# Patient Record
Sex: Male | Born: 1950 | Race: White | Hispanic: No | Marital: Married | Smoking: Never smoker
Health system: Southern US, Community
[De-identification: ages and names within clinical notes are randomized; demographics above are authoritative.]

## PROBLEM LIST (undated history)

## (undated) DIAGNOSIS — J392 Other diseases of pharynx: Secondary | ICD-10-CM

## (undated) DIAGNOSIS — H539 Unspecified visual disturbance: Secondary | ICD-10-CM

## (undated) DIAGNOSIS — I10 Essential (primary) hypertension: Secondary | ICD-10-CM

## (undated) DIAGNOSIS — J189 Pneumonia, unspecified organism: Secondary | ICD-10-CM

## (undated) DIAGNOSIS — E785 Hyperlipidemia, unspecified: Secondary | ICD-10-CM

## (undated) DIAGNOSIS — L309 Dermatitis, unspecified: Secondary | ICD-10-CM

## (undated) DIAGNOSIS — M545 Low back pain, unspecified: Secondary | ICD-10-CM

## (undated) DIAGNOSIS — M1990 Unspecified osteoarthritis, unspecified site: Secondary | ICD-10-CM

## (undated) DIAGNOSIS — H9190 Unspecified hearing loss, unspecified ear: Secondary | ICD-10-CM

## (undated) DIAGNOSIS — G709 Myoneural disorder, unspecified: Secondary | ICD-10-CM

## (undated) DIAGNOSIS — R262 Difficulty in walking, not elsewhere classified: Secondary | ICD-10-CM

## (undated) DIAGNOSIS — J349 Unspecified disorder of nose and nasal sinuses: Secondary | ICD-10-CM

## (undated) DIAGNOSIS — R519 Headache, unspecified: Secondary | ICD-10-CM

## (undated) DIAGNOSIS — I639 Cerebral infarction, unspecified: Secondary | ICD-10-CM

## (undated) DIAGNOSIS — F32A Depression, unspecified: Secondary | ICD-10-CM

## (undated) DIAGNOSIS — H9390 Unspecified disorder of ear, unspecified ear: Secondary | ICD-10-CM

## (undated) DIAGNOSIS — K219 Gastro-esophageal reflux disease without esophagitis: Secondary | ICD-10-CM

## (undated) HISTORY — DX: Unspecified disorder of nose and nasal sinuses: J34.9

## (undated) HISTORY — DX: Other diseases of pharynx: J39.2

## (undated) HISTORY — PX: TONSILLECTOMY: SUR1361

---

## 2014-11-15 ENCOUNTER — Inpatient Hospital Stay
Admission: EM | Admit: 2014-11-15 | Discharge: 2014-11-19 | DRG: 066 | Disposition: A | Discharge status: Skilled Nursing Facility | Payer: BLUE CROSS/BLUE SHIELD | Attending: Internal Medicine | Admitting: Internal Medicine

## 2014-11-15 ENCOUNTER — Observation Stay: Payer: BLUE CROSS/BLUE SHIELD

## 2014-11-15 ENCOUNTER — Inpatient Hospital Stay: Payer: BLUE CROSS/BLUE SHIELD | Admitting: Internal Medicine

## 2014-11-15 ENCOUNTER — Emergency Department: Payer: BLUE CROSS/BLUE SHIELD

## 2014-11-15 ENCOUNTER — Other Ambulatory Visit: Payer: BLUE CROSS/BLUE SHIELD

## 2014-11-15 DIAGNOSIS — I639 Cerebral infarction, unspecified: Secondary | ICD-10-CM | POA: Diagnosis present

## 2014-11-15 DIAGNOSIS — I10 Essential (primary) hypertension: Secondary | ICD-10-CM | POA: Diagnosis present

## 2014-11-15 DIAGNOSIS — I6522 Occlusion and stenosis of left carotid artery: Secondary | ICD-10-CM | POA: Diagnosis present

## 2014-11-15 DIAGNOSIS — N289 Disorder of kidney and ureter, unspecified: Secondary | ICD-10-CM | POA: Diagnosis present

## 2014-11-15 DIAGNOSIS — E860 Dehydration: Secondary | ICD-10-CM

## 2014-11-15 DIAGNOSIS — H5347 Heteronymous bilateral field defects: Secondary | ICD-10-CM | POA: Diagnosis present

## 2014-11-15 DIAGNOSIS — H53452 Other localized visual field defect, left eye: Secondary | ICD-10-CM

## 2014-11-15 DIAGNOSIS — I672 Cerebral atherosclerosis: Secondary | ICD-10-CM | POA: Diagnosis present

## 2014-11-15 DIAGNOSIS — I671 Cerebral aneurysm, nonruptured: Secondary | ICD-10-CM | POA: Diagnosis present

## 2014-11-15 DIAGNOSIS — I63531 Cerebral infarction due to unspecified occlusion or stenosis of right posterior cerebral artery: Principal | ICD-10-CM | POA: Diagnosis present

## 2014-11-15 DIAGNOSIS — G43109 Migraine with aura, not intractable, without status migrainosus: Secondary | ICD-10-CM | POA: Diagnosis present

## 2014-11-15 DIAGNOSIS — Z8249 Family history of ischemic heart disease and other diseases of the circulatory system: Secondary | ICD-10-CM

## 2014-11-15 HISTORY — DX: Essential (primary) hypertension: I10

## 2014-11-15 LAB — CBC AND DIFFERENTIAL
Basophils Absolute Automated: 0.02 10*3/uL (ref 0.00–0.20)
Basophils Automated: 0 %
Eosinophils Absolute Automated: 0.05 10*3/uL (ref 0.00–0.70)
Eosinophils Automated: 1 %
Hematocrit: 51.4 % (ref 42.0–52.0)
Hgb: 18.1 g/dL — ABNORMAL HIGH (ref 13.0–17.0)
Immature Granulocytes Absolute: 0.01 10*3/uL
Immature Granulocytes: 0 %
Lymphocytes Absolute Automated: 1.95 10*3/uL (ref 0.50–4.40)
Lymphocytes Automated: 22 %
MCH: 31.6 pg (ref 28.0–32.0)
MCHC: 35.2 g/dL (ref 32.0–36.0)
MCV: 89.9 fL (ref 80.0–100.0)
MPV: 10.6 fL (ref 9.4–12.3)
Monocytes Absolute Automated: 0.52 10*3/uL (ref 0.00–1.20)
Monocytes: 6 %
Neutrophils Absolute: 6.16 10*3/uL (ref 1.80–8.10)
Neutrophils: 71 %
Nucleated RBC: 0 /100 WBC (ref 0–1)
Platelets: 259 10*3/uL (ref 140–400)
RBC: 5.72 10*6/uL (ref 4.70–6.00)
RDW: 13 % (ref 12–15)
WBC: 8.7 10*3/uL (ref 3.50–10.80)

## 2014-11-15 LAB — COMPREHENSIVE METABOLIC PANEL
ALT: 49 U/L (ref 0–55)
AST (SGOT): 28 U/L (ref 5–34)
Albumin/Globulin Ratio: 1.6 (ref 0.9–2.2)
Albumin: 4.9 g/dL (ref 3.5–5.0)
Alkaline Phosphatase: 83 U/L (ref 38–106)
Anion Gap: 12 (ref 5.0–15.0)
BUN: 27 mg/dL (ref 9–28)
Bilirubin, Total: 0.5 mg/dL (ref 0.2–1.2)
CO2: 23 mEq/L (ref 22–29)
Calcium: 10.6 mg/dL — ABNORMAL HIGH (ref 8.5–10.5)
Chloride: 106 mEq/L (ref 100–111)
Creatinine: 1.5 mg/dL — ABNORMAL HIGH (ref 0.7–1.3)
Globulin: 3 g/dL (ref 2.0–3.6)
Glucose: 146 mg/dL — ABNORMAL HIGH (ref 70–100)
Potassium: 4.2 mEq/L (ref 3.5–5.1)
Protein, Total: 7.9 g/dL (ref 6.0–8.3)
Sodium: 141 mEq/L (ref 136–145)

## 2014-11-15 LAB — GFR: EGFR: 47.2

## 2014-11-15 LAB — I-STAT CREATININE: Creatinine I-Stat: 1.5 mg/dL (ref 0.6–1.5)

## 2014-11-15 LAB — PT/INR
PT INR: 1 (ref 0.9–1.1)
PT: 12.9 s (ref 12.6–15.0)

## 2014-11-15 LAB — APTT: PTT: 24 s (ref 23–37)

## 2014-11-15 LAB — TROPONIN I: Troponin I: <0.01 ng/mL (ref 0.00–0.09)

## 2014-11-15 MED ORDER — ATORVASTATIN CALCIUM 80 MG PO TABS
80.00 mg | ORAL_TABLET | Freq: Every evening | ORAL | Status: DC
Start: 2014-11-16 — End: 2014-11-16

## 2014-11-15 MED ORDER — ASPIRIN 325 MG PO TABS
325.00 mg | ORAL_TABLET | Freq: Every day | ORAL | Status: DC
Start: 2014-11-15 — End: 2014-11-19
  Administered 2014-11-15 – 2014-11-19 (×5): 325 mg via ORAL
  Filled 2014-11-15 (×5): qty 1

## 2014-11-15 MED ORDER — AMLODIPINE BESYLATE 5 MG PO TABS
10.0000 mg | ORAL_TABLET | Freq: Every day | ORAL | Status: DC
Start: 2014-11-16 — End: 2014-11-19
  Administered 2014-11-16 – 2014-11-19 (×4): 10 mg via ORAL
  Filled 2014-11-15 (×4): qty 2

## 2014-11-15 MED ORDER — SODIUM CHLORIDE 0.9 % IV SOLN
INTRAVENOUS | Status: DC
Start: 2014-11-15 — End: 2014-11-16

## 2014-11-15 MED ORDER — ENOXAPARIN SODIUM 40 MG/0.4ML SC SOLN
40.00 mg | SUBCUTANEOUS | Status: DC
Start: 2014-11-15 — End: 2014-11-19
  Administered 2014-11-15 – 2014-11-18 (×4): 40 mg via SUBCUTANEOUS
  Filled 2014-11-15 (×4): qty 0.4

## 2014-11-15 MED ORDER — FAMOTIDINE 20 MG PO TABS
20.00 mg | ORAL_TABLET | Freq: Two times a day (BID) | ORAL | Status: DC
Start: 2014-11-15 — End: 2014-11-19
  Administered 2014-11-15 – 2014-11-19 (×8): 20 mg via ORAL
  Filled 2014-11-15 (×8): qty 1

## 2014-11-15 MED ORDER — ATORVASTATIN CALCIUM 80 MG PO TABS
80.00 mg | ORAL_TABLET | Freq: Every day | ORAL | Status: DC
Start: 2014-11-15 — End: 2014-11-15
  Filled 2014-11-15: qty 1

## 2014-11-15 MED ORDER — GADOBUTROL 1 MMOL/ML IV SOLN
INTRAVENOUS | Status: AC
Start: 2014-11-15 — End: 2014-11-15
  Administered 2014-11-15: 10 mL
  Filled 2014-11-15: qty 10

## 2014-11-15 NOTE — Acute Ischemic Stroke Note (Signed)
Current/Home Medications    AMLODIPINE (NORVASC) 10 MG TABLET    Take 10 mg by mouth daily.    LOSARTAN (COZAAR) 100 MG TABLET    Take 100 mg by mouth daily.     Past Medical History   Diagnosis Date   . Hypertension      NIHSS - 4.  PT with left side weakness, headache presenting similar to migraine for last week.  PT thought pain would improve, SX worsened over last 7 days.  PT assessed by RN, NIHSS as charted.  MRI performed, large occlusion seen.  Neurologist made aware, saw PT at bedside.  Reason for not initiating tPA (alteplase) within 0-3 hrs of onset of symptoms: Onset time greater than or equal to 3 hours

## 2014-11-15 NOTE — Consults (Addendum)
NEUROLOGY CONSULATION    Date Time: 11/15/2014 3:24 PM  Patient Name: Blake [name]  Attending Physician: He, Zadie Rhine, MD PHD      Assessment & Plan:   1. New onset of visual disturbance, left face, arm and leg numbness(ongoing since 1 week).  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  Cannot rule out migrainous infarction, given prior history of migraines.  2. Mild to moderate headache.  3. Hypertension by history  4. Prior history of migraines with aura.       Stat head CT to rule out bleed.   Will need MRI, MRA head and neck   Basic labs, as well    If CT negative aspirin 325.   Echocardiogram.   Lipid panel.   Will follow      ---Not candidate for any intervention given time of onset about a week       Addendum, MRI of the brain revealed large right occipital lobe stroke, along with thalamic area involvement which explain the symptoms perfectly, we will proceed with further stroke workup.  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  The symptoms remained  persistent, without  resolving and hence he comes into the hospital today for further assessment.  He is having some mild confusion, visual disturbance, mainly on the left side, and also some difficulty with balance and moving his left arm.    The day after onset of the visual disturbance.  She started experiencing some numbness numbing altered perception are feeling in the left face, arm and the leg as well. He reports no motor symptoms such as weakness, any slurred speech.  The headache is still ongoing, though it is mild.    Past Medical History:     Past Medical History   Diagnosis Date    . Hypertension        Meds:      Scheduled Meds: PRN Meds:        Continuous Infusions:         I personally reviewed all of the medications norvasc ASA     No Known Allergies    Social & Family History:     History     Social History   . Marital Status: Married     Spouse Name: N/A     Number of Children: N/A   . Years of Education: N/A     Occupational History   . Not on file.     Social History Main Topics   . Smoking status: Never Smoker    . Smokeless tobacco: Not on file   . Alcohol Use: No   . Drug Use: No   . Sexual Activity: Not on file     Other Topics Concern   . Not on file     Social History Narrative   . No narrative on file     No family history on file.    Review of Systems:   MILD  headache, No, ear nose, throat problems; no coughing or wheezing or shortness of breath, No chest pain or orthopnea, no abdominal pain, nausea or vomiting, No pain  in the body or extremities, no psychiatric, neurological, endocrine, hematological or cardiac complaints except as noted above.     Physical Exam:   Blood pressure 168/88, pulse 65, temperature 97.9 F (36.6 C), resp. rate 16, SpO2 100 %.    HEENT: Normocephalic. Non-icter, no congestion, no carotid bruits  Lungs:  CTA bil  Cardiac:  S1,S2, normal rate and rhythm  Neck: supple, no lymphadenopathy, no thyromegaly, no JVD, no cartoid bruits  Extremities: no clubbing, cyanosis, or edema  Skin: no rashes or lesions noted    Neuro:  Level of consciousness:  Alert and appropriate  Oriented:  X 3  Cognition:  Intact naming, recognition, concentration and following complex commands  Cranial Nerves: Dense left-sided visual field deficit noted, increased palpebral fissure on the right side noted, pupils are equal, reactive.  Tongue was midline.  Motor examination revealed no obvious drift, mild ataxia noted on finger-to-nose testing on the left side.    Strength:  No upper extremity drift, 5/5 strength x 4 extremities  Coordination: mild ataxia   Reflexes:  +1  throughout,   Sensation: Intact x 4 extremities to LT, temp,     Labs:   No results for input(s): GLU, BUN, CREAT, CA, NA, K, CL, CO2, ALB, PHOS, MG, AST, ALT, BILITOTAL, ALKPHOS in the last 168 hours.    Invalid input(s): TBILI  No results for input(s): WBC, HGB, HCT, MCV, MCH, MCHC, PLT in the last 168 hours.      No results for input(s): PTT, PT, INR in the last 72 hours.       Radiology Results (24 Hour)     ** No results found for the last 24 hours. **           All recent brain and spine imaging (MRI, CT) personally reviewed.    Chart reviewed discussed with DR HE     Case discussed with: ed physician   Signed by: Cathe Mons, MD  Spectralink: [phone]       Answering Service: [phone]

## 2014-11-15 NOTE — H&P (Addendum)
ADMISSION HISTORY AND PHYSICAL EXAM    IN or other new complaints.  Has noted some trouble with memory intermittently over [facility]   Miguel Barrera [facility]   [place] Pager: [phone]      Date Time: 11/15/2014 4:44 PM  Patient Name: Blake Sheer, Blake [name]  Attending Physician: He, Zadie Rhine, MD PHD  Primary Care Physician: Pershing Cox, Blake [name]    CC: Left Sided numbness, visual disturbance    Assessment:     Active Hospital Problems    Diagnosis   . Loss of peripheral visual field, left   . Acute CVA (cerebrovascular accident)   . Essential hypertension   Left carotid stenosis   Dehydration and acute renal insuffiencey    Plan:   -Admit to [facility] Hospitalist service  This is a pleasant 63 year old gentleman who presented with several days history of left-sided numbness along with headache and decrease in visual field, noted to have an acute CVA.  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   Patient will be started on aspirin and high-dose statin  Obtain PT, OT and speech therapy evaluation  Carotid Doppler for better characterization of left carotid stenosis  Provide IV hydration and allow permissive elevation of blood pressure, we'll hold losartan for now.  Continue to assess and modify risk factors as appropriate  Monitor renal function         History of Presenting Illness:   Blake Sheer, Blake [name] is a 63 y.o. male who presents to the hospital with left sided numbness. Symptoms started about a week ago with onset of headache and usual symptoms of migraine including aura and decrease in visual field with flashing lights and it improved somewhat after a day but never resolved completely. Today developed decrease in vision again and with persistent Left sided numbness presented for evaluation. Denies any recent fever, cough, SOB, nausea, vomiting or trouble with speech. Had mild confusion intermittently over last 1-2  days.     Past Medical History:     Past Medical History   Diagnosis Date   . Hypertension    Migraine     Available old records reviewed, including: EPIC    Past Surgical History:   History reviewed. No pertinent past surgical history.    Family History:   Non contributory except several family members with Migraine     Social History:     History   Smoking status   . Never Smoker    Smokeless tobacco   . 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    LOSARTAN (COZAAR) 100 MG TABLET    Take 100 mg by mouth daily.   Naproxen PRN, taking frequently lately for headache     Review of Systems:   All other systems were reviewed and are negative except:as above in HPI     Physical Exam:   Patient Vitals for the past 24 hrs:   BP Temp Pulse Resp SpO2   11/15/14 1500 168/88 mmHg 97.9 F (36.6 C) 65 16 100 %     There is no  height or weight on file to calculate BMI.  No intake or output data in the 24 hours ending 11/15/14 1644    General: awake; WD alert caucasian male in NAD  HEENT: perrla, eomi, sclera anicteric  oropharynx clear without lesions, mucous membranes dryt  Neck: supple, no lymphadenopathy, no JVD, no carotid bruits  Cardiovascular: Normal S1 and S2, no murmurs, rubs or gallops  Lungs: clear to auscultation bilaterally, without wheezing, rhonchi, or rales  Abdomen: soft, non-tender, non-distended; no palpable masses, no hepatosplenomegaly, normoactive bowel sounds, no rebound or guarding  Extremities: no clubbing, cyanosis, or edema  Neuro: alert, oriented x 3, cranial nerves grossly intact, strength 5/5 in upper and lower extremities, Left hemianopsia, left sided numbness   Skin: no rashes or lesions noted      Labs:     Results     Procedure Component Value Units Date/Time    Comprehensive metabolic panel  [[id number]]  (Abnormal) Collected:  11/15/14 1609    Specimen Information:  Blood Updated:  11/15/14 1640     Glucose 146 (H) mg/dL      BUN 27 mg/dL      Creatinine 1.5 (H) mg/dL      Sodium 062 mEq/L      Potassium 4.2 mEq/L      Chloride 106 mEq/L      CO2 23 mEq/L      CALCIUM 10.6 (H) mg/dL      Protein, Total 7.9 g/dL      Albumin 4.9 g/dL      AST (SGOT) 28 U/L      ALT 49 U/L      Alkaline Phosphatase 83 U/L      Bilirubin, Total 0.5 mg/dL      Globulin 3.0 g/dL      Albumin/Globulin Ratio 1.6      Anion Gap 12.0     GFR [[id number]] Collected:  11/15/14 1609     EGFR 47.2 Updated:  11/15/14 1640    APTT [[id number]] Collected:  11/15/14 1608     PTT 24 sec Updated:  11/15/14 1631    Protime-INR [[id number]] Collected:  11/15/14 1608    Specimen Information:  Blood Updated:  11/15/14 1630     PT 12.9 sec      PT INR 1.0      PT Anticoag. Given Within 48 hrs. None     CBC and differential [[id number]]  (Abnormal) Collected:  11/15/14 1608    Specimen Information:  Blood / Blood Updated:  11/15/14 1622     WBC 8.70 x10 3/uL      RBC 5.72 x10 6/uL      Hgb 18.1 (H) g/dL      Hematocrit 38.1 %      MCV 89.9 fL      MCH 31.6 pg      MCHC 35.2 g/dL      RDW 13 %      Platelets 259 x10 3/uL      MPV 10.6 fL      Neutrophils 71 %      Lymphocytes Automated 22 %      Monocytes 6 %      Eosinophils Automated 1 %      Basophils Automated 0 %      Immature Granulocyte 0 %      Nucleated RBC 0 /100 WBC      Neutrophils Absolute 6.16 x10 3/uL      Abs Lymph Automated 1.95 x10 3/uL  Abs Mono Automated 0.52 x10 3/uL      Abs Eos Automated 0.05 x10 3/uL      Absolute Baso Automated 0.02 x10 3/uL      Absolute Immature Granulocyte 0.01 x10 3/uL     Troponin I [[id number]] Collected:  11/15/14 1608    Specimen Information:  Blood Updated:  11/15/14 1617        EKG reviewed --NSR, rate 80, no acute changes     Imaging personally reviewed, including: brain MRI  Mr Angiogram Head Wo Contrast    11/15/2014      1. Occlusion of the  right posterior cerebral artery. 2. There is a mild stenosis of the basilar artery and severe attenuation of the intracranial right vertebral artery. 3. 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    Fonnie Mu, Blake [name]  11/15/2014 5:35 PM     Mr Angiogram Neck W Wo Contrast    11/15/2014    Localized stenosis of over 90% of the proximal left internal carotid artery.  Fonnie Mu, Blake [name]  11/15/2014 5:38 PM     Mri Brain W Wo Contrast    11/15/2014    1. Large area of restricted diffusion throughout the right occipital lobe acute/subacute infarction corresponding to the posterior cerebral artery territory. 2. Atrophy and chronic small vessel ischemic changes.  Fonnie Mu, Blake [name]  11/15/2014 5:30 PM     Chest Ap Portable    11/15/2014    No evidence of acute cardiopulmonary disease.  Fonnie Mu, Blake [name]  11/15/2014 5:16 PM        No results found.    Safety Checklist  DVT prophylaxis:  CHEST guideline (See page [id number]) Chemical and Mechanical   Foley: Not present   IVs:  Peripheral IV   PT/OT: Ordered       Signed by: Arna Medici, Blake [name]   WG:[id number], Karie Mainland, Blake [name]

## 2014-11-15 NOTE — ED Notes (Signed)
Patient states for one week having visual changes to the left eye.

## 2014-11-15 NOTE — ED Provider Notes (Addendum)
Physician/Midlevel provider first contact with patient: 11/15/14 1518         EMERGENCY DEPARTMENT NOTE    Physician/Midlevel provider first contact with patient: 11/15/14 1518         HISTORY OF PRESENT ILLNESS   Historian:Patient  Translator Used: No    63 y.o. male h/o htn, migraine headache here because of worsening left sided visual field deficits in bilateral eyes. His symptoms started last wk but got worse 4 days ago. Julianne Chamberlin initially ignored the symptoms because its similar to the aura from his migraine headaches.     1. Location of symptoms: neuro  2. Onset of symptoms: 4 days  3. What was patient doing when symptoms started (Context): nothing  4. Severity: severe  5. Timing: constant  6. Activities that worsen symptoms: none  7. Activities that improve symptoms: none  8. Quality:   9. Radiation of symptoms:  10. Associated signs and Symptoms: left sided hemianopsia in bilateral eyes  11. Are symptoms worsening?NO  MEDICAL HISTORY     Past Medical History:  Past Medical History   Diagnosis Date   . Hypertension        Past Surgical History:  History reviewed. No pertinent past surgical history.    Social History:  History     Social History   . Marital Status: Married     Spouse Name: N/A     Number of Children: N/A   . Years of Education: N/A     Occupational History   . Not on file.     Social History Main Topics   . Smoking status: Never Smoker    . Smokeless tobacco: Not on file   . Alcohol Use: No   . Drug Use: No   . Sexual Activity: Not on file     Other Topics Concern   . Not on file     Social History Narrative   . No narrative on file       Family History:  No family history on file.    Outpatient Medication:  Previous Medications    AMLODIPINE (NORVASC) 10 MG TABLET    Take 10 mg by mouth daily.    LOSARTAN (COZAAR) 100 MG TABLET    Take 100 mg by mouth daily.         REVIEW OF SYSTEMS   ADD ROS  Review of Systems   Eyes:        Left visual field deficits in bilateral eyes   All other systems  reviewed and are negative.    PHYSICAL EXAM     Filed Vitals:    11/15/14 1922   BP: 162/81   Pulse: 79   Temp: 97.9 F (36.6 C)   Resp: 20   SpO2: 96%     Nursing note and vitals reviewed.  Constitutional:  Well developed, well nourished.   Head:  Atraumatic. Normocephalic.    Eyes:  PERRL. EOMI. Conjunctivae are not pale.  ENT:  Mucous membranes are moist and intact. Oropharynx is clear and symmetric.  Patent airway.  Neck:  Supple. Full ROM.    Cardiovascular:  Regular rate. Regular rhythm. No murmurs, rubs, or gallops.  Respiratory:  No evidence of respiratory distress. Clear to auscultation bilaterally.  No wheezing, rales or rhonchi.   GI:  Soft and non-distended. There is no tenderness. No rebound, guarding, or rigidity.  Back:  Full ROM. Nontender.  MSK:  No edema. No cyanosis. No clubbing. Full range of motion  in all extremities.  Skin:  Skin is warm and dry.  No diaphoresis. No rash.   Neurological:  Alert, awake, and appropriate. Bilateral left half visual field deficit. Normal speech. Motor normal.  Psychiatric:  Good eye contact. Normal interaction, affect, and behavior.        MEDICAL DECISION MAKING     DISCUSSION      R/o ich, r/o electrolyte abn, will admit for neuro evaluation      tpa considered not indicated, outside of tpa window      Vital Signs: Reviewed the patient?s vital signs.   Nursing Notes: Reviewed and utilized available nursing notes.  Medical Records Reviewed: Reviewed available past medical records.      PROCEDURES        CARDIAC STUDIES    The following cardiac studies were independently interpreted by the Emergency Medicine Physician.  For full cardiac study results please see chart.    Monitor Strip  Interpreted by ED Physician  Rate: 78  Rhythm: SR      EKG Interpretation:    17:24 NSR at 80 bpm, NAd, no LVH, qrs 98, qtc 442 (-) ST-T changes no prior ekg for comparison as read by me.     IMAGING STUDIES    The following imaging studies were independently interpreted by the  Emergency Medicine Physician.  For full imaging study results please see chart.    US Carotid Duplex Dopp Comp Bilateral   Preliminary Result      MR Angiogram Neck W WO Contrast   Final Result      Localized stenosis of over 90% of the proximal left internal carotid   artery.      Fonnie Mu, MD    11/15/2014 5:38 PM         MR Angiogram Head WO Contrast   Final Result        1. 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   2. There is a mild stenosis of the basilar artery and severe attenuation   of the intracranial right vertebral artery.   3. 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          Fonnie Mu, MD    11/15/2014 5:35 PM         MRI Brain W WO Contrast   Final Result      1. Large area of restricted diffusion throughout the right occipital   lobe acute/subacute infarction corresponding to the posterior cerebral   artery territory.   2. Atrophy and chronic small vessel ischemic changes.      Fonnie Mu, MD    11/15/2014 5:30 PM         Chest AP Portable   Final Result      No evidence of acute cardiopulmonary disease.      Fonnie Mu, MD    11/15/2014 5:16 PM         Echocardiogram Adult Comp W Clr/Dop/Bubble    (Results Pending)          PULSE OXIMETRY    Oxygen Saturation by Pulse Oximetry: 96%  Interventions: none  Interpretation: normal    EMERGENCY DEPT. MEDICATIONS      ED Medication Orders     Start     Status Ordering Provider    11/15/14 1638  gadobutrol (GADAVIST) 1 MMOL/ML injection SOLN     Comments:  Created by cabinet override    Last MAR action:  Given  LABORATORY RESULTS    Ordered and independently interpreted AVAILABLE laboratory tests. Please see results section in chart for full details.  Results for orders placed or performed during the hospital encounter of 11/15/14   CBC and differential   Result Value Ref Range    WBC 8.70 3.50 - 10.80 x10 3/uL    RBC 5.72 4.70 - 6.00 x10 6/uL    Hgb 18.1 (H) 13.0 - 17.0 g/dL    Hematocrit 08.6 57.8 -  52.0 %    MCV 89.9 80.0 - 100.0 fL    MCH 31.6 28.0 - 32.0 pg    MCHC 35.2 32.0 - 36.0 g/dL    RDW 13 12 - 15 %    Platelets 259 140 - 400 x10 3/uL    MPV 10.6 9.4 - 12.3 fL    Neutrophils 71 None %    Lymphocytes Automated 22 None %    Monocytes 6 None %    Eosinophils Automated 1 None %    Basophils Automated 0 None %    Immature Granulocyte 0 None %    Nucleated RBC 0 0 - 1 /100 WBC    Neutrophils Absolute 6.16 1.80 - 8.10 x10 3/uL    Abs Lymph Automated 1.95 0.50 - 4.40 x10 3/uL    Abs Mono Automated 0.52 0.00 - 1.20 x10 3/uL    Abs Eos Automated 0.05 0.00 - 0.70 x10 3/uL    Absolute Baso Automated 0.02 0.00 - 0.20 x10 3/uL    Absolute Immature Granulocyte 0.01 0 x10 3/uL   Comprehensive metabolic panel   Result Value Ref Range    Glucose 146 (H) 70 - 100 mg/dL    BUN 27 9 - 28 mg/dL    Creatinine 1.5 (H) 0.7 - 1.3 mg/dL    Sodium 469 629 - 528 mEq/L    Potassium 4.2 3.5 - 5.1 mEq/L    Chloride 106 100 - 111 mEq/L    CO2 23 22 - 29 mEq/L    CALCIUM 10.6 (H) 8.5 - 10.5 mg/dL    Protein, Total 7.9 6.0 - 8.3 g/dL    Albumin 4.9 3.5 - 5.0 g/dL    AST (SGOT) 28 5 - 34 U/L    ALT 49 0 - 55 U/L    Alkaline Phosphatase 83 38 - 106 U/L    Bilirubin, Total 0.5 0.2 - 1.2 mg/dL    Globulin 3.0 2.0 - 3.6 g/dL    Albumin/Globulin Ratio 1.6 0.9 - 2.2    Anion Gap 12.0 5.0 - 15.0   Protime-INR   Result Value Ref Range    PT 12.9 12.6 - 15.0 sec    PT INR 1.0 0.9 - 1.1    PT Anticoag. Given Within 48 hrs. None    APTT   Result Value Ref Range    PTT 24 23 - 37 sec   Troponin I   Result Value Ref Range    Troponin I <0.01 0.00 - 0.09 ng/mL   GFR   Result Value Ref Range    EGFR 47.2    ECG 12 lead   Result Value Ref Range    Ventricular Rate 80 BPM    Atrial Rate 80 BPM    P-R Interval 198 ms    QRS Duration 98 ms    Q-T Interval 384 ms    QTC Calculation (Bezet) 442 ms    P Axis 55 degrees    R Axis -22 degrees    T  Axis 60 degrees       CONSULTATIONS and ED Course      Case discussed with Dr. Nyra Capes, will admit.     Dr. Francesco Sor,  neuro, has evaluated patient and will consult.     .Patient feels better. I have discussed all testing results and plan of care with patient. Patient agrees with admission.     ATTESTATIONS      Physician Attestation: I, Dr. Zadie Rhine Josslin Sanjuan, MD PhD , have been the primary provider for [name], MD during this Emergency Dept visit and have reviewed the chart documented for accuracy and agree with its content.       DIAGNOSIS      Diagnosis:  Final diagnoses:   Loss of peripheral visual field, left       Disposition:  ED Disposition     Admit Admitting Physician: Estevan Ryder  Diagnosis: Loss of peripheral visual field, left [[id number]]  Estimated Length of Stay: > or = to 2 midnights  Tentative Discharge Plan?: Home or Self Care [1]  Patient Class: Inpatient [101]  I certify that inpatient services are medically necessary for this patient. Please see H&P and MD progress notes for additional information about the patient's course of treatment. For Medicare patients, services provided in accordance with 412.3 and expected LOS to be greater than 2 midnights for Medicare patients.: Yes            Prescriptions:  Patient's Medications   New Prescriptions    No medications on file   Previous Medications    AMLODIPINE (NORVASC) 10 MG TABLET    Take 10 mg by mouth daily.    LOSARTAN (COZAAR) 100 MG TABLET    Take 100 mg by mouth daily.   Modified Medications    No medications on file   Discontinued Medications    No medications on file           Micael Barb, Zadie Rhine, MD [facility]  11/15/14 2133    Enma Maeda, Zadie Rhine, MD [facility]  11/15/14 2134

## 2014-11-16 ENCOUNTER — Other Ambulatory Visit: Payer: BLUE CROSS/BLUE SHIELD

## 2014-11-16 LAB — LIPID PANEL
Cholesterol / HDL Ratio: 7.6
Cholesterol: 235 mg/dL — ABNORMAL HIGH (ref 0–199)
HDL: 31 mg/dL — ABNORMAL LOW (ref >40)
LDL Calculated: 162 mg/dL — ABNORMAL HIGH (ref 0–99)
Triglycerides: 210 mg/dL — ABNORMAL HIGH (ref 34–149)
VLDL Calculated: 42 mg/dL — ABNORMAL HIGH (ref 10–40)

## 2014-11-16 LAB — BASIC METABOLIC PANEL
Anion Gap: 8 (ref 5.0–15.0)
BUN: 23 mg/dL (ref 9–28)
CO2: 23 mEq/L (ref 22–29)
Calcium: 9.4 mg/dL (ref 8.5–10.5)
Chloride: 111 mEq/L (ref 100–111)
Creatinine: 1.1 mg/dL (ref 0.7–1.3)
Glucose: 101 mg/dL — ABNORMAL HIGH (ref 70–100)
Potassium: 4 mEq/L (ref 3.5–5.1)
Sodium: 142 mEq/L (ref 136–145)

## 2014-11-16 LAB — GFR: EGFR: >60

## 2014-11-16 LAB — SEDIMENTATION RATE: Sed Rate: 6 mm/Hr (ref 0–10)

## 2014-11-16 LAB — HEMOLYSIS INDEX: Hemolysis Index: 7 (ref 0–18)

## 2014-11-16 LAB — C-REACTIVE PROTEIN: C-Reactive Protein: <0.1 mg/dL (ref 0.0–0.5)

## 2014-11-16 LAB — HEMOGLOBIN A1C: Hemoglobin A1C: 5.7 % (ref 0.0–6.0)

## 2014-11-16 MED ORDER — ROSUVASTATIN CALCIUM 10 MG PO TABS
10.0000 mg | ORAL_TABLET | Freq: Every evening | ORAL | Status: DC
Start: 2014-11-16 — End: 2014-11-16

## 2014-11-16 MED ORDER — ROSUVASTATIN CALCIUM 10 MG PO TABS
10.00 mg | ORAL_TABLET | Freq: Every evening | ORAL | Status: DC
Start: 2014-11-16 — End: 2014-11-16

## 2014-11-16 MED ORDER — ROSUVASTATIN CALCIUM 10 MG PO TABS
40.0000 mg | ORAL_TABLET | Freq: Every evening | ORAL | Status: DC
Start: 2014-11-16 — End: 2014-11-16

## 2014-11-16 MED ORDER — ROSUVASTATIN CALCIUM 10 MG PO TABS
20.0000 mg | ORAL_TABLET | Freq: Every evening | ORAL | Status: DC
Start: 2014-11-17 — End: 2014-11-19
  Administered 2014-11-17 – 2014-11-18 (×2): 20 mg via ORAL
  Filled 2014-11-16 (×2): qty 2

## 2014-11-16 MED ORDER — PRAVASTATIN SODIUM 20 MG PO TABS
80.00 mg | ORAL_TABLET | Freq: Once | ORAL | Status: AC
Start: 2014-11-16 — End: 2014-11-16
  Administered 2014-11-16: 80 mg via ORAL
  Filled 2014-11-16: qty 4

## 2014-11-16 MED ORDER — PRAVASTATIN SODIUM 20 MG PO TABS
80.0000 mg | ORAL_TABLET | Freq: Every evening | ORAL | Status: DC
Start: 2014-11-16 — End: 2014-11-16

## 2014-11-16 NOTE — Plan of Care (Signed)
Pt admitted into the unit accompanied by the ED nurse and family(son), oriented to the room and call light, safety precautions in place. Vital signs wnl, PERRLA but pt still c/o loss of peripheral visual field on the left eye and numbness/ reduced sensation on the left side, denies pain but requested for antacid, order for famotidine administered, continuos hydration and Q4H neurochecks in progress.

## 2014-11-16 NOTE — Progress Notes (Signed)
MEDICINE PROGRESS NOTE  [facility], DIVISION OF HOSPITALIST MEDICINE   [facility]   [place] Pager: [phone]      Date Time: 11/16/2014 4:44 PM  Patient Name: Blake Sheer, MD  Attending Physician: Arna Medici, MD  Hospital Day: 2  Assessment:     Active Hospital Problems    Diagnosis   . Loss of peripheral visual field, left   . Acute CVA (cerebrovascular accident)   . Essential hypertension   Dyslipidemia   Dehydration and acute renal insuffiencey    Plan:   Pleasant 63 year old gentleman who presented with several days history of left-sided numbness along with headache and decrease in visual field, noted to have an acute left occipital CVA. 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   Patient will be started on aspirin and high-dose statin  Patient does not tolerate atorvastatin well which was ordered yesterday and after much discussion of risks / benefits and efficacy along with stroke guidelines, it was determined that he will be best served by starting Crestor, will monitor for any side effects     PT, OT and speech therapy evaluation noted, recommend acute rehab, will follow up with rehab eval     Eventual out pt f/u for Left CEA   Allow permissive elevation of blood pressure, we'll hold losartan for now. Continue to assess and modify risk factors as appropriate, follow with echo   Monitor renal function     Case discussed with: Pt, staff, consultant     Safety Checklist:     DVT prophylaxis:  CHEST guideline (See page [id number]) Chemical and Mechanical   Foley:  [place] Rn Foley protocol Not present   IVs:  Peripheral IV   PT/OT: Ordered   Daily CBC & or Chem ordered:  SHM/ABIM guidelines (see #5) No     Lines:     Patient Lines/Drains/Airways Status    Active PICC Line / CVC Line / PIV Line / Drain / Airway / Intraosseous Line / Epidural Line / ART Line / Line / Wound / Pressure Ulcer / NG/OG Tube     Name:   Placement date:    Placement time:   Site:   Days:    Peripheral IV 11/15/14 Right Antecubital  11/15/14   1535   Antecubital   1                 Disposition:     Today's date: 11/16/2014  Length of Stay: 1  Anticipated medical stability for discharge: December,  13 - Morning  Reason for ongoing hospitalization: Acute CVA  Anticipated discharge needs: rehab    Subjective     CC: Acute CVA (cerebrovascular accident)  Interval History/24 hour events:     HPI/Subjective: Patient does not feel any changes in his left-sided facial and extremity numbness and left hemianopsia is also unchanged.  Continues with mild to moderate headache.      Review of Systems:   Review of Systems - Negative except as above in HPI    Physical Exam:     Temp:  [97.8 F (36.6 C)-98.5 F (36.9 C)] 97.8 F (36.6 C)  Heart Rate:  [59-79] 65  Resp Rate:  [14-20] 17  BP: (121-169)/(60-95) 164/95 mmHg    Intake/Output Summary (Last 24 hours) at 11/16/14 1644  Last data filed at 11/16/14 0800   Gross per 24 hour   Intake 761.25 ml   Output  1300 ml   Net -538.75 ml    General: awake, alert  Caucasian male,in no acute distress  Cardiovascular: regular rate and rhythm, no murmurs, rubs or gallops  Lungs: clear to auscultation bilaterally, no additional sounds  Abdomen: soft, non-tender, non-distended; normoactive bowel sounds  Extremities: no edema  Neurological: Alert and oriented X 3, moves all extremities. Reduced sensation and poor coordination on left side, left hemianopsia        Meds:   Medications were reviewed:  Scheduled Meds:  Current Facility-Administered Medications   Medication Dose Route Frequency   . amLODIPine  10 mg Oral Daily   . aspirin  325 mg Oral Daily   . enoxaparin  40 mg Subcutaneous Q24H   . famotidine  20 mg Oral Q12H SCH   . [START ON 11/17/2014] rosuvastatin  20 mg Oral QHS     Continuous Infusions:  . sodium chloride 75 mL/hr at 11/16/14 0839     PRN Meds:.  Labs:   No results for input(s): GLUCOSEWB in the last 24 hours.      Recent  Labs  Lab 11/15/14  1608   WBC 8.70   HEMOGLOBIN 18.1*   HEMATOCRIT 51.4   PLATELETS 259      Recent Labs  Lab 11/15/14  1608   PT 12.9   PT INR 1.0   PTT 24         Recent Labs  Lab 11/16/14  0356 11/15/14  1609   SODIUM 142 141   POTASSIUM 4.0 4.2   CHLORIDE 111 106   CO2 23 23   BUN 23 27   CREATININE 1.1 1.5*   EGFR >60.0 47.2   GLUCOSE 101* 146*   CALCIUM 9.4 10.6*      Recent Labs  Lab 11/15/14  1609   ALKALINE PHOSPHATASE 83   BILIRUBIN, TOTAL 0.5   PROTEIN, TOTAL 7.9   ALBUMIN 4.9   ALT 49   AST (SGOT) 28        Imaging personally reviewed, including: all available  Mr Angiogram Head Wo Contrast    11/15/2014      1. 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 2. There is a mild stenosis of the basilar artery and severe attenuation of the intracranial right vertebral artery. 3. 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    Fonnie Mu, MD  11/15/2014 5:35 PM     Mr Angiogram Neck W Wo Contrast    11/15/2014    Localized stenosis of over 90% of the proximal left internal carotid artery.  Fonnie Mu, MD  11/15/2014 5:38 PM     Mri Brain W Wo Contrast    11/15/2014    1. Large area of restricted diffusion throughout the right occipital lobe acute/subacute infarction corresponding to the posterior cerebral artery territory. 2. Atrophy and chronic small vessel ischemic changes.  Fonnie Mu, MD  11/15/2014 5:30 PM     Chest Ap Portable    11/15/2014    No evidence of acute cardiopulmonary disease.  Fonnie Mu, MD  11/15/2014 5:16 PM     US Carotid Duplex Dopp Comp Bilateral    11/16/2014   IMPRESSION : 1. Severe left internal carotid artery stenosis likely greater than 80% with irregular partly calcified atherosclerotic plaque. 2. Mild somewhat irregular at the first carotid plaque right carotid bifurcation stenosis less than 40%. 3. Antegrade flow both vertebral arteries.  Findings discussed with Dr. Judith Blonder  Dana Allan, MD  11/16/2014 1:06 PM         Signed by: Arna Medici, MD

## 2014-11-16 NOTE — OT Eval Note (Signed)
[facility]  [address]  [place], [place] [address]  [phone]    Occupational Therapy Evaluation    Patient: Blake [name], Blake [name] MRN: [id number]   Unit: CCU CRITICAL CARE Bed: M620/M620-01    Time of treatment: Time Calculation  OT Received On: 11/16/14  Start Time: 1150  Stop Time: 1210  Time Calculation (min): 20 min    Consult received for Blake [name], Blake [name] for OT evaluation and treatment.  Patient's medical condition is appropriate for Occupational Therapy  intervention at this time.    D/C Suggestions   Acute Rehab    Assessment     Blake [name], Blake [name] is a 63 y.o. male admitted 11/15/2014. Pt's ability to complete ADLs and functional transfers is impaired due to the following deficits:  Impaired vision, Impaired LUE sensation, strength, coordination, decreased dynamic balance, impaired activity tolerance, delayed processing.  Pt would continue to benefit from OT to address these deficits and increase independence with ADLs and functional transfers.         Rehabilitation Potential: good     Interdisciplinary Communication: discussed w/ RN, PT     Plan     OT Plan  Risks/Benefits/POC Discussed with Pt/Family: With patient  Treatment Interventions: ADL retraining;Functional transfer training;Patient/Family training;Equipment eval/education;Neuro muscular reeducation;Compensatory technique education  Discharge Recommendation: Acute Rehab  DME Recommended for Discharge:  (TBD next level of care)  OT Frequency Recommended: 3-4x/wk         Medical Diagnosis: Loss of peripheral visual field, left [H53.452]         History of Present Illness: Blake [name], Blake [name] is a 63 y.o. male admitted on  11/15/2014 with   History of Presenting Illness:    Blake [name], Blake [name] is a 63 y.o. male who presents to the hospital with left sided numbness. Symptoms started about a week ago with onset of headache and usual symptoms of migraine including aura and decrease in visual field with flashing lights  and it improved somewhat after a day but never resolved completely. Today developed decrease in vision again and with persistent Left sided numbness presented for evaluation. Denies any recent fever, cough, SOB, nausea, vomiting or trouble with speech. Had mild confusion intermittently over last 1-2 days.         Patient Active Problem List   Diagnosis   . Loss of peripheral visual field, left   . Acute CVA (cerebrovascular accident)   . Essential hypertension     Past Medical History   Diagnosis Date   . Hypertension      History reviewed. No pertinent past surgical history.      X-Rays/Tests/Labs:  US Carotid Duplex Dopp Comp Bilateral (Order #[id number]) on 11/15/2014 - Imaging Information  IMPRESSION :  1. Severe left internal carotid artery stenosis likely greater than 80%  with irregular partly calcified atherosclerotic plaque.  2. Mild somewhat irregular at the first carotid plaque right carotid  bifurcation stenosis less than 40%.  3. Antegrade flow both vertebral arteries.    Chest AP Portable (Order [id number]) on 11/15/2014 - Imaging Information  IMPRESSION:   No evidence of acute cardiopulmonary disease.    MR Angiogram Neck W WO Contrast (Order [id number]) on 11/15/2014 - Imaging Information  IMPRESSION:   Localized stenosis of over 90% of the proximal left internal carotid  Artery        MRI Brain W WO Contrast (Order #[id number]) on 11/15/2014 - Imaging Information   IMPRESSION:   1. Large  area of restricted diffusion throughout the right occipital  lobe acute/subacute infarction corresponding to the posterior cerebral  artery territory.  2. Atrophy and chronic small vessel ischemic changes.      Social History:  Lives with spouse in a house.  Entry Steps: 5   Rails: yes Inside steps: flight  Rails: yes  Equipment at home: none   Prior Level of Function:  Pt independent w/ ADLs, IADLs, functional mobility, drives. Currently practices as neurologist.     Subjective     Patient is agreeable to  participation in the therapy session. Nursing clears patient for therapy.  Patient's Goal:  Did not state   Pain: 0/10    Objective     Precautions: Falls, L visual field cut     Patient is in bed with  Telemetry, Intravenous Access, Pulse Oximeter  and Blood Pressure Cuff  in place.       Observation of patient/vitals:   Filed Vitals:    11/16/14 0000 11/16/14 0400 11/16/14 0800 11/16/14 1200   BP: 121/60 160/84 164/95    Pulse: 67 59 65    Temp: 98.5 F (36.9 C) 98.5 F (36.9 C) 98.1 F (36.7 C) 97.8 F (36.6 C)   TempSrc:  Oral Oral Axillary   Resp: 16 14 17     Height:       Weight:       SpO2: 95% 98% 99% 99%       Orientation/Cognition:     Alert and Oriented x 4  Cognition: follows commands w/o difficulty, delayed processing     Musculoskeletal Examination:   ROM Strength   RUE WFL  5/5    LUE WFL  4/5      Sensation: LUE impaired sensation to light touch, reports "pressure" feeling as well as intermittent N/T   Coordination: mildly impaired FMC L   Vision: impaired, L visual field cut   Hearing: WFL     Functional Mobility:  Rolling: independent     Scooting:independent to EOB   Supine to sit: CGA   Sit to Supine: CGA   Sit to stand: CGA   Stand to sit: CGA   Transfers: min A     Balance:  Static Sit Balance: good   Dynamic Sit Balance: good   Static Stand Balance: fair+  Dynamic Stand Balance: poor     Self Care:  Eating: set up, utensils R side   Grooming: set up seated   Bathing: min A   UB Dressing: set up   LB Dressing: min A  Toileting: min A    Endurance: fair     Participation:  good    Education:  Educated the patient to role of occupational therapy, plan of care, goals  of therapy and safety with mobility and ADLs.  Patient is in bed with Medical Equipment, and call bell within reach. RN notified of session outcome.  Mobility and ADL status posted at bedside.    Goals:  Goals  Goal Formulation: Patient  Time For Goal Achievement: 5 visits  Goals: Select goal    ADL Goals  Patient will dress  lower body: Modified Independent  Patient will toilet: Supervision    Mobility and Transfer Goals  Pt will transfer bed to toilet: Supervision  Pt will perform bathtub transfer: Supervision    Neuro Re-Ed Goals  Pt will perform dynamic standing balance: Supervision;for 5 minutes;for 10 minutes;to complete standing ADLs safely  Vision Goals  Pt will scan environment/ADL tools: with supervision;to locate items within environment for ADLs;to increase ability to complete ADLs (w/ occasional vc's)           Signature:   Ane Payment, OT  11/16/2014  4:03 PM  Phone: [phone]

## 2014-11-16 NOTE — Progress Notes (Signed)
Assessing patient for Acute Rehab admission.  Will need insurance authorization for admission to [facility] and OT/PT evals required to obtain this.  Will follow.  [place]ling Congress, [place] [id number]

## 2014-11-16 NOTE — Plan of Care (Signed)
Problem: Day 2- Stroke  Goal: Neurological status is stable or improving  Outcome: Progressing  Patient alert, oriented x 4. Neurological checks remains within normal limits.   Goal: Stable vital signs and fluid balance  Outcome: Progressing  Patient received Norvasc as ordered. Patient able to verbalized purpose, adverse effects and benefits of medicine.   Goal: Patient will maintain Adequate Oxygenation  Outcome: Progressing  Patient remains on RA 97 %. Patient denies  Shortness of breath .  Goal: Patients risk of aspiration will be minimized  Outcome: Progressing  Patient maintains aspiration precautions with meals. Keeps head of bed  90 degree as ordered. Remains on thin liquids. Evaluated by Speech pathologist.  Goal: Nutritional Status Improving  Outcome: Progressing  Patient has poor appetite for about a few weeks. Nutritionist on board. Will encourage small meals.  Goal: Elimination patterns are normal or improving  Outcome: Progressing  Patient remains continent of bowel and ladder. Voiding  In urinal. He stated "I had loose stool  Yesterday". Zero bleeding noted.  Goal: Mobility/activity is maintained at optimum level for patient  Outcome: Progressing  Patient  Is able to get out of bed with min assistance of one person.

## 2014-11-16 NOTE — Plan of Care (Signed)
Care of the patient turned over to [facility].

## 2014-11-16 NOTE — Progress Notes (Signed)
The patient and care giver's learning abilities have been assessed. Today's individualized plan of care to patient  was discussed with the patient and care giver and agree to it. Patient and care giver demonstrates understanding of disease process, treatment plan, medications and consequences of noncompliance. All questions and concerns were addressed.

## 2014-11-16 NOTE — Patient Care Conference (Signed)
Multidisciplinary rounds completed with Dr Genene Churn, respiratory, case management and staff.

## 2014-11-16 NOTE — Plan of Care (Signed)
Problem: Day of Admission- Stroke  Goal: Neurological status is stable or improving  Outcome: Completed Date Met:  11/16/14  Pt is alert and oriented x 4, NIH on admission is 4, fall and safety precautions in place.  Goal: Stable vital signs and fluid balance  Outcome: Completed Date Met:  11/16/14  Vital signs wnl  Goal: Patient will maintain Adequate Oxygenation  Outcome: Completed Date Met:  11/16/14  O2 sat in the 90s  Goal: Patients risk of aspiration will be minimized  Outcome: Completed Date Met:  11/16/14  Passed dysphagia screening.  Goal: Nutritional Status Improving  Outcome: Completed Date Met:  11/16/14  Goal: Elimination patterns are normal or improving  Outcome: Completed Date Met:  11/16/14  Goal: Mobility/activity is maintained at optimum level for patient  Outcome: Completed Date Met:  11/16/14  Goal: Skin integrity is maintained or improved  Outcome: Completed Date Met:  11/16/14  Goal: Neurovascular Status is Stable or Improving  Outcome: Completed Date Met:  11/16/14  Goal: Effective coping demonstrated  Outcome: Completed Date Met:  11/16/14  Goal: Will be able to express needs and understand communication  Outcome: Completed Date Met:  11/16/14    Comments:   Pt is alert and oriented x 4, Q4H neurochecks in progress

## 2014-11-16 NOTE — PT Eval Note (Signed)
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  Patient's medical condition is appropriate for Physical Therapy  intervention at this time.    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  Pt's functional mobility is impaired due to the following deficits:  Pt presents with impaired sensation to L side-light touch and proprioception, impaired vision, L, ataxic gait, decreased strength L side, slow processing, some word finding difficulty.  Pt would continue to benefit from PT to address these deficits and increase functional independence.     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 His symptoms started last wk but got worse 4 days ago. He initially ignored the symptoms because its similar to  the aura from his migraine headaches.     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 Symptoms started about a week ago with onset of headache and usual symptoms of migraine including aura and decrease in visual field with flashing lights and it improved somewhat after a day but never resolved completely. Today developed decrease in vision again and with persistent Left sided numbness presented for evaluation. Denies any recent fever, cough, SOB, nausea, vomiting or trouble with speech. Had mild confusion intermittently over last 1-2 days.       Patient Active Problem List   Diagnosis   . Loss of peripheral visual field, left   . Acute CVA (cerebrovascular accident)   . Essential hypertension     Past Medical History   Diagnosis Date   . Hypertension      History reviewed. No pertinent past surgical history.    X-Rays/Tests/Labs:  Results for [name], MD (MRN [id number]) as of 11/16/2014 15:33   Ref. Range 11/16/2014 03:56   Cholesterol Latest Range: 0-199 mg/dL 841 (H)   HDL Latest Range: >40 mg/dL 31 (L)   Calculated LDL Latest Range: 0-99 mg/dL 324 (H)   Triglycerides Latest Range: 34-149 mg/dL 401 (H)        MR Angiogram Head WO Contrast      IMPRESSION:   1. 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  2. There  is a mild stenosis of the basilar artery and severe attenuation  of the intracranial right vertebral artery.  3. 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  MRI Brain W WO Contrast  IMPRESSION:   1. Large area of restricted diffusion throughout the right occipital  lobe acute/subacute infarction corresponding to the posterior cerebral  artery territory.  2. Atrophy and chronic small vessel ischemic changes.  MR Angiogram Neck W WO Contrast  IMPRESSION:   Localized stenosis of over 90% of the proximal left internal carotid  artery.     US Carotid Duplex Dopp Comp Bilateral      IMPRESSION :  1.  Severe left internal carotid artery stenosis likely greater than 80%  with irregular partly calcified atherosclerotic plaque.  2. Mild somewhat irregular at the first carotid plaque right carotid  bifurcation stenosis less than 40%.  3. Antegrade flow both vertebral arteries        Social History:  Lives with wife in a house.  Entry Steps: 5   Rails: yes Inside steps: flight  Rails: yes  Equipment at home: none  Prior Level of Function:  Pt is independent with all functional mobility and ADL's; no AD.  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 Nursing clears patient for therapy.  Patient's Goal:  None stated  Pain: 0/10    Objective     Precautions/ Contraindications: falls, impaired L visual field    Patient is in bed with  Telemetry, Intravenous Access, Pulse Oximeter  and Blood Pressure Cuff in place.    Observation of patient/vitals:  Filed Vitals:    11/16/14 0000 11/16/14 0400 11/16/14 0800 11/16/14 1200   BP: 121/60 160/84 164/95    Pulse: 67 59 65    Temp: 98.5 F (36.9 C) 98.5 F (36.9 C) 98.1 F (36.7 C) 97.8 F (36.6 C)   TempSrc:  Oral Oral Axillary   Resp: 16 14 17     Height:       Weight:       SpO2: 95% 98% 99% 99%       Orientation/Cognition:  Alert and Oriented x 4  Cognition: wfl, processing is a bit slow; although prior to this incident pt did at baseline speak slowly and thoughtfully    Musculoskeletal Examination:      ROM Strength   Neck/ Trunk wfl 5/5   RUE     LUE     RLE wfl 5/5   LLE wfl 4/5     Sensation: impaired LUE, trunk to just distal to knee anteriorly  Coordination: fair- BLE    Functional Mobility:  Rolling: ind    Supine to sit: CGA  Scooting: ind to EOB  Sit to Supine: CGA  Sit to stand: CGA  Stand to sit: min a cues  Transfers: min a cues    Ambulation:     Weightbearing: fwb   Assistance level: min a   Distance: 15'   Assistive Device: none   Gait  Deviations: ataxic, no attempt at independent recovery   Stairs: NT    Balance:  Static Sit: good  Dynamic Sit:   Static Stand: fair+  Dynamic Stand: poor    Endurance: fair    Participation:  good    Education:  Educated the patient to role of physical therapy, plan of care,  goals  of therapy and safety with mobility and ADLs.    Patient is in bed with Telemetry, Intravenous (IV), Pulse Oximeter  and Blood Pressure Cuff, and call bell within reach. RN notified of session outcome.  Mobility status posted at bedside and within E.M.R.    Goals  Goal Formulation: With patient  Time for Goal Acheivement: 5 visits  Goals: Select goal  Pt Will Perform Sit to Stand: with stand by assist (with fair balance)  Pt Will Transfer Bed/Chair: with stand by assist (safely)  Pt Will Ambulate: > 200 feet (with LRAD)  Pt Will Go Up / Down Stairs: 1 flight;with contact guard assist;With rail  Other Goal: Pt retrieve an item from the floor independently,;  no LOB    Signature:  Novella Olive, PT  11/16/2014  3:31 PM  Phone: [phone]

## 2014-11-16 NOTE — SLP Eval Note (Signed)
[facility]  [address]  [place], [place] [address]  [phone]    SPEECH LANGUAGE COGNITIVE SWALLOW EVALUATION    Patient: Blake LANCE, MD    MRN#: [id number]    Date/Time of Evaluation:   Time Calculation  SLP Received On: 11/16/14  Start Time: 0830  Stop Time: 1000  Time Calculation (min): 90 min  Total Treatment Time (min): 90    Consult received for Blake Sheer, MD for SLP Evaluation and Treatment.    Referring Physician: Dr. 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 There is also a mild component of a visual agnosia affecting his ability to process his environment of which he is aware. He is unable to locate items on the left side of his table. With max cues to use edgedness, he was able to locate items on the left. Processing is mildly slow for mod complex yes/no questions. He is able to follow simple 2 step directions. Mild difficulty noted with responsive naming. Slow to name 5 items per simple category. Decreased word retrieval, thought organization/generation and specificity for expressing ideas in conversation and answering open-ended questions.This pt's speech has always been well thought out and not rushed in delivery, however, while this still exists, it is more pronounced and with less specific content. Inconsistent insight. Pt found reading too visually straining to complete a written cognitive task. Swallow is [facility] for small sips of thin liquids, purees and soft solids. Adequate timing and laryngeal elevation noted. No cough, change in vocal quality or other s/s of aspiration observed. Occasional delayed  cough noted after large bolus sip of thin liquids, possibly due to reflux. No cough with small sips. Cues to use small sips and use double swallows and liquid assists to increase swallow safety. Discussed above with pt's son who reports decreased word retrieval noted with his dad.    Plan   Individual and group SLP therapy 5 days per week for 2 week(s).  Conduct patient and family education.  Continue comprehensive evaluation for higher level cognition as vision improves.      Interdisciplinary Communication: Discussed with RN.         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 Hypertension      History reviewed. No pertinent past surgical history.    Social History: Pt reports that he lives with his wife and has 2 grown children. He is a neurologist.    Subjective   Patient is agreeable to participation in the therapy session. Nursing clears patient for therapy. Patient's medical condition is  appropriate for Speech therapy intervention at this time.  Patient's Goal:  To improve vision.       Precautions: Safety, Aspiration    Objective   Completed swallow and SLP evaluation with formal and informal measures. RIPA-Problem Solving and Abstract Reasoning-23/24, Tx focused on  use of swallow strategies and use of strategies to increase vision on the left.    Educated the patient to role of speech therapy, plan of care, goals of  therapy.                                                   Goals: Patient will answer mod complex yes/no questions with 0 assistance/ cues.  Patient will express needs, wants, and ideas at mod-complex level with improved specificity with min-0 assistance/cues.  Pt will use strategies to increase vision/attention to his left for locating items in  his environment and reading simple functional material with mod cues.  Participate in higher level cognitive testing to determine further goals as appropriate.    Signature:Frani Shari Natt, M.S. CCC-SLP

## 2014-11-16 NOTE — Plan of Care (Signed)
Problem: Day 2- Stroke  Goal: Skin integrity is maintained or improved  Outcome: Progressing  Skin remains dry intact.  Goal: Neurovascular Status is Stable or Improving  Outcome: Progressing  Zero bleeding noted. Patient on lovenox. Patient verbalized purpose, benefits and adverse effects of medicine.   Goal: Effective coping demonstrated  Patient has flat affect. Taking decision about acute rehab.  Goal: Will be able to express needs and understand communication  Outcome: Progressing  Patient is copying effectively.

## 2014-11-16 NOTE — Progress Notes (Signed)
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  male    NUTRITION:  Reason for assessment: New admission    Assessment   Upon initial visit, patient reported a lack of appetite, mild feelings of nausea for ~1 week r/t consistent headache. Has had one meal at hospital so far and ate about 75%. No further nutrition needs at this time.     Past Medical History   Diagnosis Date   . Hypertension      Wt Readings from Last 30 Encounters:   11/15/14 108 kg (238 lb 1.6 oz)     History     Social History   . Marital Status: Married     Spouse Name: N/A     Number of Children: N/A   . Years of Education: N/A     Occupational History   . Not on file.     Social History Main Topics   . Smoking status: Never Smoker    . Smokeless tobacco: Not on file   . Alcohol Use: No   . Drug Use: No   . Sexual Activity: Not on file     Other Topics Concern   . Not on file     Social History Narrative   . No narrative on file       Active Hospital Problems    Diagnosis   . Loss of peripheral visual field, left   . Acute CVA (cerebrovascular accident)   . Essential hypertension     No Known Allergies  GI Symptoms: Denies N/V/abd. pain, mild motion sickness r/t headache  Skin: Intact, no edema    Current Meds:    amLODIPine 10 mg Daily   aspirin 325 mg Daily   enoxaparin 40 mg Q24H   famotidine 20 mg Q12H SCH   rosuvastatin 40 mg QHS     . sodium chloride 75 mL/hr at 11/16/14 0839       Recent Labs:    Recent Labs  Lab 11/15/14  1608   WBC 8.70   HEMOGLOBIN 18.1*   HEMATOCRIT 51.4   MCV 89.9   PLATELETS 259       Recent Labs  Lab 11/16/14  0356 11/15/14  1609   SODIUM 142 141   POTASSIUM 4.0 4.2   CHLORIDE 111 106   CO2 23 23   BUN 23 27   CREATININE 1.1 1.5*   GLUCOSE 101* 146*   CALCIUM 9.4 10.6*   EGFR >60.0 47.2       Recent Labs  Lab 11/15/14  1609   ALBUMIN 4.9       Intake/Output Summary (Last 24 hours) at 11/16/14 1203  Last data filed at 11/16/14 0800   Gross per 24 hour   Intake 761.25 ml   Output   1300 ml   Net -538.75 ml        Current Diet Order  Diet cardiac    Anthropometrics  Height: 188 cm (6\' 2" )  Weight: 108 kg (238 lb 1.6 oz)  Weight Change: 0  IBW/kg (Calculated) Male: 86.4 kg  IBW/kg (Calculated) Male: 77.24 kg  BMI (calculated): 30.6    Estimated Nutrition Needs:  1900-2150 kcals (22-25 kcal/kg IBW)  150-170 g protein (1.7-2.0 g/kg IBW)  1900-2150 ml fluid (1 ml/kcal)    Learning & Discharge Planning Needs: None at this time  Religious/Cultural Food Practices: None addressed    Nutrition Diagnosis:   Overweight/obesity related to undesirable food choices as evidenced by BMI of  30.6, 125% of IBW.     Intervention:  - Continue on least restrictive diet as tolerated.     Goals:  - Patient to consume 75-100% of meals by next follow-up visit.    M/E:  Will follow-up with patient in 10 days.    Dinah Beers, Dietetic Intern  Leeanne Deed, RDN, CLT

## 2014-11-16 NOTE — Consults (Addendum)
CARDIOLOGY CONSULTATION  Date:  11/16/2014, 12:03 PM.     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    MRN:  [id number].  Gender:  male    ATTENDING:  Arna Medici, MD   PCP:  Pershing Cox, MD        ASSESSMENT AND PLAN:  --- acute CVA   Discussed with Dr. Francesco [name], likely from atherosclerosis   ECHO with bubble  --- carotid stenosis   lexiscan nuc after acute phase of CVA passes   Eventually will need CEA  --- HTN   On amlodipine   BP elevated but appropriate  --- HLD   Started crestor    CHIEF COMPLAINT:  Patient is seen in consultation at the request of Dr. Delsa [name] for a cardiac evaluation of CVA  .  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  However visual deficit spread to the left field and pt developed left face and arm symptoms.  Therefore he came for evaluation and  MRI showed right occipital CVA.  Carotid u/s showed 40% left and > 80% right stenosis.    Pt is not known to have CAD.  He risk factor PTA included HTN and family hx of CAD.  Cholesterol this admission is elevated.  He does not exercise regularly.    PERTINENT PAST MEDICAL HISTORY:  --- HTN  --- hx migraines    STUDIES:  --- ECG  (Independent visualization)   Normal sinus rhythm.  Normal QRS.  No ischemic ST changes.       --- CXR  No evidence of acute cardiopulmonary disease.  Fonnie Mu, MD   11/15/2014    --- ECHO  Pending    --- duplex  1. Severe left internal carotid artery stenosis likely greater than 80%  with irregular partly calcified atherosclerotic plaque.  2. Mild somewhat irregular at the first carotid plaque right carotid  bifurcation stenosis less than 40%.  3. Antegrade flow both vertebral arteries.  Findings discussed with Dr. Roena Malady, MD   11/16/2014     MRA neck  There is severe localized stenosis of the proximal left  internal carotid artery measuring 4 mm in length. The area of stenosis  is  present 9 mm beyond the bifurcation.  The carotid bifurcations are patent and smooth-walled. The proximal  right internal artery is normal in course and caliber. There is moderate  attenuation of the distal right vertebral artery most notably its  intracranial portion which appears patent.  IMPRESSION:   Localized stenosis of over 90% of the proximal left internal carotid  artery.  Fonnie Mu, MD   11/15/2014    MRA brain  1. 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  2. There is a mild stenosis of the basilar artery and severe attenuation  of the intracranial right vertebral artery.  3. 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  Fonnie Mu, MD   11/15/2014    MRI brain  1. Large area of restricted diffusion throughout the right occipital  lobe acute/subacute infarction corresponding to the posterior cerebral  artery territory.  2. Atrophy and chronic small vessel ischemic changes.  Fonnie Mu, MD   11/15/2014    ALLERGY:  No Known Allergies    INPATIENT MEDICATIONS AT BEGINNING OF VISIT:  INFUSION MEDS:  . sodium chloride 75 mL/hr at 11/16/14  1610     SCHEDULED MEDS:  Current Facility-Administered Medications   Medication Dose Route Frequency   . amLODIPine  10 mg Oral Daily   . aspirin  325 mg Oral Daily   . enoxaparin  40 mg Subcutaneous Q24H   . famotidine  20 mg Oral Q12H SCH   . rosuvastatin  40 mg Oral QHS     PRN MEDS:      SOCIAL HISTORY:   History   Substance Use Topics   . Smoking status: Never Smoker    . Smokeless tobacco: Not on file   . Alcohol Use: No       FAMILY HISTORY:  Father had Afib and hx CAD    REVIEW OF SYSTEMS:  In addition to systems and symptoms stated in the HPI...  General:  No fatigue  Neuro:  No syncope  Lungs:  No SOB, no orthopnea  CV:   No CP, no palpitation  All other systems negative     Filed Vitals:    11/15/14 2200 11/16/14 0000 11/16/14 0400 11/16/14 0800   BP:  121/60 160/84 164/95   Pulse:  67 59 65   Temp:  98.5 F (36.9 C) 98.5 F (36.9  C) 98.1 F (36.7 C)   TempSrc:   Oral Oral   Resp:  16 14 17    Height: 1.88 m (6\' 2" )      Weight: 108 kg (238 lb 1.6 oz)      SpO2:  95% 98% 99%     PHYSICAL EXAM:   >> General:   well appearing,  no distress,  >> Eyes:   anicteric,  >> ENMT:   moist mucosa,  >> Respiratory:   unlabored respiration,  no wheeze,  no crackles,  >> Cardiovascular:   regular rhythm,  II/VI systolic ejection murmur,  No gallop,  No LE edema,  >> Gastrointestinal:   Abdomen soft and nontender,  >> Musculoskeletal:   normal muscle tone and strength upper extremities >> Skin:   No ecchymoses,  >> Neuro:   Alert,  >> Psychiatric:   Appropriate mood and affect,     PERTINENT LABS:    Recent Labs  Lab 11/15/14  1608   WBC 8.70   HEMOGLOBIN 18.1*   HEMATOCRIT 51.4   PLATELETS 259        Recent Labs  Lab 11/16/14  0356 11/15/14  1609   SODIUM 142 141   POTASSIUM 4.0 4.2   CHLORIDE 111 106   CO2 23 23   BUN 23 27   CREATININE 1.1 1.5*       Recent Labs  Lab 11/15/14  1608   TROPONIN I <0.01     No results for input(s): BNP in the last 168 hours.    Thank you for the opportunity to participate in this patient's care    Ashraf Mesta Alyson Locket, MD [facility]  [facility]  [facility] [place]  Tel [phone]

## 2014-11-16 NOTE — Progress Notes (Signed)
Date Time: 11/16/2014 1:40 PM  Patient Name: Blake Sheer, MD  Attending Physician: Arna Medici, MD  Patient Class: Inpatient  Hospital Day: 1            NEUROLOGY PROGRESS NOTE             Assessment/Plan   1. Acute right PCA stroke, involving the occipital lobe, and the thalamic region.  Likely secondary to atherosclerotic disease.  2. Hypertension.  3. Hyperlipidemia.  4. Prior history of migraines with aura  5. Left carotid high-grade stenosis.  6. Basilar stenosis  7. MCA aneurysm  8.     Continue aspirin and statin.  We will get cardiology involved to rule out coronary artery disease  Wait on echocardiogram.  Wait on PT evaluation.        Subjective:   Patient Seen and Examined.  No new changes.  No headache, visual disturbance continues complaining of visual agnosia, left-sided numbness and tingling remained.    Review of Systems:   No headache, eye, ear nose, throat problems; no coughing or wheezing or shortness of breath, No chest pain or orthopnea, no abdominal pain, nausea or vomiting, No pain in the body or extremities, no psychiatric, neurological, endocrine, hematological or cardiac complaints except as noted above.        Physical Exam:   BP 164/95 mmHg  Pulse 65  Temp(Src) 98.1 F (36.7 C) (Oral)  Resp 17  Ht 1.88 m (6\' 2" )  Wt 108 kg (238 lb 1.6 oz)  BMI 30.56 kg/m2  SpO2 99%    Neuro:  Level of consciousness:  Alert and appropriate  Oriented:  X 3  Facial Movements: symmetric  pupils are equal and reactive .  Mildly disconjugate gaze  Strength:  No upper extremity drift, possibly mild sensory drift in the left arm  Sensation to light touch: Altered perception to light touch, temperature, the left side of the body.  HEENT: Normocephalic. No icter or congestion    Neck: supple, no lymphadenopathy, no thyromegaly, no JVD    CV: RRR, No murmers, rubs or gallops    Pulm: Clear to auscultation BIL, nonlabored     Abd: soft, NT/ND, normoactive BS    Extremities: no clubbing, cyanosis, or  edema    Skin: no rashes or lesions noted    Meds:      Scheduled Meds: PRN Meds:      amLODIPine 10 mg Oral Daily   aspirin 325 mg Oral Daily   enoxaparin 40 mg Subcutaneous Q24H   famotidine 20 mg Oral Q12H SCH   pravastatin 80 mg Oral QHS       Continuous Infusions:  . sodium chloride 75 mL/hr at 11/16/14 0839                 Labs:     Recent Labs  Lab 11/16/14  0356 11/15/14  1609   GLUCOSE 101* 146*   BUN 23 27   CREATININE 1.1 1.5*   CALCIUM 9.4 10.6*   SODIUM 142 141   POTASSIUM 4.0 4.2   CHLORIDE 111 106   CO2 23 23   ALBUMIN  --  4.9   AST (SGOT)  --  28   ALT  --  49   BILIRUBIN, TOTAL  --  0.5   ALKALINE PHOSPHATASE  --  83       Recent Labs  Lab 11/15/14  1608   WBC 8.70   HEMOGLOBIN 18.1*   HEMATOCRIT 51.4  MCV 89.9   MCH, POC 31.6   MCHC 35.2   PLATELETS 259         Recent Labs      11/15/14   1608   PTT  24   PT  12.9   PT INR  1.0          Radiology Results (24 Hour)     Procedure Component Value Units Date/Time    US Carotid Duplex Dopp Comp Bilateral [[id number]] Collected:  11/16/14 1302    Order Status:  Completed Updated:  11/16/14 1310    Narrative:       CAROTID DUPLEX    CLINICAL HISTORY: Stroke    Duplex imaging of the extracranial cerebral vasculature includes 2-D,  grayscale and color-flow imaging was gated Doppler velocity measurement  from the clavicles the angle of mandible.    Stenosis estimation is based on velocity criteria with denominator of  the tubular portion of the distal internal carotid artery, corresponding  with NASCET trial  criteria.     COMPARISON STUDY: None available    FINDINGS: On the right common carotid artery is patent with some mild  calcified and somewhat irregular atherosclerotic plaque and intimal  thickening at the carotid bifurcation. Velocity measurements however  demonstrate minimal velocity elevation within the bulbar proximal  internal carotid artery to indicate less than 40% stenosis. External  carotid artery is patent.    On the left, the common  carotid artery is patent proximally. There is a  partially calcified somewhat irregular appearing atherosclerotic plaque  in the carotid bifurcation. Just beyond the carotid bulb and proximal  internal carotid artery there is fairly severe velocity elevation noted  with peak velocities 376/124 cm/s. More distal internal carotid artery  appears patent. Velocity elevation here is consistent with a significant  stenosis greater than 80%.    Antegrade flow is noted in both vertebral arteries.      Impression:      IMPRESSION :  1. Severe left internal carotid artery stenosis likely greater than 80%  with irregular partly calcified atherosclerotic plaque.  2. Mild somewhat irregular at the first carotid plaque right carotid  bifurcation stenosis less than 40%.  3. Antegrade flow both vertebral arteries.    Findings discussed with Dr. Roena Malady, MD   11/16/2014 1:06 PM      MR Angiogram Neck W WO Contrast [[id number]] Collected:  11/15/14 1735    Order Status:  Completed Updated:  11/15/14 1742    Narrative:      MRA OF THE NECK    CLINICAL STATEMENT: Acute visual disturbance. Weakness..    TECHNIQUE: The carotid and vertebral arteries in the neck were examined  with 3-D time-of-flight methodology and multiplanar reconstructed images  were created from the source data. Coronal slab 3-D gradient-echo images  were obtained following 10mL of Gadavist administration to assess the  carotid and vertebral arteries. Multiplanar reconstructed images were  created from the source data.    Stenosis was estimated utilizing the residual internal carotid artery  diameter with [place] American Symptomatic Carotid Endarterectomy Trial  (NASCET) criteria.    FINDINGS: There is severe localized stenosis of the proximal left  internal carotid artery measuring 4 mm in length. The area of stenosis  is present 9 mm beyond the bifurcation.    The carotid bifurcations are patent and smooth-walled.  The proximal  right internal  artery is normal in course and caliber. There is moderate  attenuation of the distal right vertebral artery most notably its  intracranial portion which appears patent.      Impression:        Localized stenosis of over 90% of the proximal left internal carotid  artery.    Fonnie Mu, MD   11/15/2014 5:38 PM      MR Angiogram Head WO Contrast [[id number]] Collected:  11/15/14 1730    Order Status:  Completed Updated:  11/15/14 1739    Narrative:          MRA CIRCLE OF WILLIS    CLINICAL STATEMENT: Acute right visual disturbance. Unilateral weakness.    COMPARISON: No prior studies are available for comparison.      TECHNIQUE:  3D time-of-flight methodology was utilized to assess the  vessels at the base of the brain and the major branches of the circle of  Willis. Multiplanar reconstructed images were created from the source  data.    FINDINGS: The right posterior cerebral artery is absent and likely  occluded. Localized area of mild stenosis are noted of the mid and  distal basilar artery. The right vertebral artery is severely attenuated  although can be visualized on the MRA of the neck with contrast.     There is aneurysmal dilatation of the distal left M1 branch measuring  0.8 x 0.5 cm without a visible discrete neck which appears to represent  localized fusiform dilatation of the vessel.     The left A1 branch is not present. The left posterior cerebral artery is  supplied via the anterior circulation and a prominent left posterior  communicating artery.       Impression:          1. 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  2. There is a mild stenosis of the basilar artery and severe attenuation  of the intracranial right vertebral artery.  3. 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       Fonnie Mu, MD   11/15/2014 5:35 PM      MRI Brain W Ilda Basset Contrast [[id number]] Collected:  11/15/14 1724    Order Status:  Completed Updated:  11/15/14 1734    Narrative:      MRI  BRAIN    CLINICAL STATEMENT: New onset of visual disturbance. Left face arm and  leg numbness.    COMPARISON: No prior studies are available for comparison.    TECHNIQUE: MRI of the brain was performed utilizing various pulse  sequences and planes including T1, T2,  FLAIR and diffusion weighted  sequences. 10 mL of Gadavist was administered and T1-weighted axial and  coronal sequences performed.    FINDINGS: A large area of restricted diffusion is noted throughout the  right occipital lobe with associated low signal on the ADC maps.  Localized restricted diffusion is also noted of the right thalamus in  this region. Mild edema is noted throughout this region with effacement  of the sulci.    There is mild diffuse prominence of the sulci. Patchy areas of elevated  T2 and FLAIR signal are noted within the periventricular and subcortical  white matter. There is no mass, or midline shift.     No abnormal intra or extra-axial fluid collections are identified. The  lateral ventricles are normal in size and appearance.     There is no  intracranial hemorrhage.     The paranasal sinuses are clear. Evaluation of the orbits is  unremarkable. Appropriate marrow signal is  noted of the osseous  structures including the clivus.      Impression:        1. Large area of restricted diffusion throughout the right occipital  lobe acute/subacute infarction corresponding to the posterior cerebral  artery territory.  2. Atrophy and chronic small vessel ischemic changes.    Fonnie Mu, MD   11/15/2014 5:30 PM      Chest AP Portable [[id number]] Collected:  11/15/14 1715    Order Status:  Completed Updated:  11/15/14 1720    Narrative:      FRONTAL CHEST    CLINICAL STATEMENT: Weakness. Possible stroke.    COMPARISON: No prior studies are available for comparison.    FINDINGS: The lungs are clear. The cardiomediastinal silhouette is  unremarkable.       Impression:        No evidence of acute cardiopulmonary disease.    Fonnie Mu, MD   11/15/2014 5:16 PM             All brain imaging (MRI, CT) personally reviewed.    Case discussed with: Radiology      Signed by: Cathe Mons, MD  Spectralink: [phone]      Answering Service: [phone]

## 2014-11-16 NOTE — Plan of Care (Signed)
The patient and care giver's learning abilities have been assessed. Today's individualized plan of care to Mrs Ortman  was discussed with the patient and care giver and agree to it. Patient and care giver demonstrates understanding of disease process, treatment plan, medications and consequences of noncompliance. All questions and concerns were addressed.

## 2014-11-17 ENCOUNTER — Inpatient Hospital Stay: Payer: BLUE CROSS/BLUE SHIELD

## 2014-11-17 NOTE — Plan of Care (Signed)
Problem: Safety  Goal: Patient will be free from injury during hospitalization  Outcome: Progressing  Call bell within reach with hourly rounding in progress.    Problem: Pain  Goal: Patient's pain/discomfort is manageable  Outcome: Progressing  Pt denies pain at this time.

## 2014-11-17 NOTE — Progress Notes (Signed)
Severe Sepsis Screen    Date: 11/17/2014 Time: 2:16 AM  Nurse Signature: Joni Fears    Exclusions:      Patients meeting the following criteria are excluded from screening:     []  Suspicion or diagnosis of sepsis is documented and until 72 hours after antibiotics started or last regimen change:   - If Yes, Date of Documented Sepsis:                                          - If Yes, Date of last change in antibiotics:                                           []  Surgery - No screening for 24 hours after surgery   - If Yes, Date of Surgery:                                           []  [place] hypothermia protocol- Resume screening when [employer] sun complete   []  Comfort Care Orders- Do not resume screening    Did you check any of the boxes above?     [x]  No, Continue to section A   []  Yes, Stop Here, Patient Excluded from Sepsis Screening. If screening should resume in the future, place "sticky note to treatment team" with date/time of when screening should resume. Communicate patient excluded from screening due to Comfort Care Orders using "sticky note to treatment team."    A. Infection:      Does your patient have ONE or more of the following infection criteria?     []  Documented Infection - Does the patient have positive culture results (from blood, sputum, urine, etc)?   []  Anti-Infective Therapy - Is the patient receiving antibiotic, antifungal, or other anti-infective therapy?   []  Pneumonia - Is there documentation of pneumonia (X Ray, etc)?   []  WBC's - Have WBC's been found in normally sterile fluid (urine, CSF, etc.)?   []  Perforated Viscus - Does the patient have a perforated hollow organ (bowel)?    A.  Did you check any of the boxes above?     [x]  No, Stop Here and Sepsis Screen Negative   []  Yes, continue to section B    B. SIRS:      Does your patient have TWO or more of the following SIRS criteria?     []  Temperature - Is the patient's temperature: Temp: 98.6 F (37 C) (11/17/14  0100)   - Greater than or equal to 38.3 degrees C (greater than 100.9 degrees F)?   - Less than or equal to 36 degrees C (less than or equal to 96.8 degrees F)?    []  Heart Rate: Heart Rate: (!) 59 (11/17/14 0100)   - Is the patient's heart rate greater than or equal to 90 bpm?     []  Respiratory: Resp Rate: 14 (11/17/14 0100)   - Is the patient's respiratory rate greater than 20?     []  WBC Count - Is the patient's WBC count:   Recent Labs  Lab 11/15/14  1608   WBC 8.70       -  Greater than or equal to 12,000/mm3 OR   - Less than or equal to 4,000/mm3 OR    - Are there greater than 10% immature neutrophils (bands)?     []  Glucose >140 without diabetes or on steroids?   Recent Labs  Lab 11/16/14  0356   GLUCOSE 101*         []  Significant edema is present?    B.  Did you check two or more of the boxes above?     []  No, Stop Here and Sepsis Screen Negative   []  Yes, contact Charge Nurse, continue to section C    C. ACUTE Organ Dysfunction:      Does your patient have ONE or more of the following organ dysfunction? (May need to wait for lab results for assessment - see below) Organ dysfunction must be a result of the sepsis NOT CHRONIC conditions.     []  Cardiovascular - Does the patient have a: BP: 149/82 mmHg (11/17/14 0100)   - Systolic Blood Pressure less than or equal to 90 mmHg OR   - Systolic Blood Pressure has dropped 40 mmHg or more from baseline OR   - Mean Arterial Pressure less than or equal to 70 mmHg (for at least one hour despite fluid resuscitation OR   - require vasopressor support?     []  Respiratory - Does the patient have new hypoxia defined by any of the following?   - A sustained increase in oxygen requirements by at least 2L/min on NC or 28% FiO2 within the last 24 hrs OR   - A persistent decrease in oxygen saturation of greater than or equal to 5% lasting at least four or more hours and occurring within the last 24 hours     []  Renal - Does the patient have:   - low urine output (e.g.  Less than 0.5 mL/kg/HR for one hour despite adequate fluid resuscitation OR   - Increased creatinine (greater than 50% increase from baseline) OR   - require acute dialysis?     []  Hematologic - Does the patient have:   - Low platelet count (less than 100,000 mm3)   Recent Labs  Lab 11/15/14  1608   PLATELETS 259    OR   - INR/aPTT greater than upper limit of normal?   Recent Labs  Lab 11/15/14  1608   PT INR 1.0    OR No results for input(s): APTT in the last 168 hours.     []  Metabolic - Does the patient have a high lactate (plasma lactate greater than or equal to 2.4 mMol/L? No results for input(s): LACTATE in the last 168 hours.     []  Hepatic - Are the patient's liver enzymes elevated (ALT greater than 72 IU/L or Total Bilirubin greater than 2 MG/dL)?   Recent Labs  Lab 11/15/14  1609   BILIRUBIN, TOTAL 0.5   ALT 49         []  CNS - Does the patient have altered consciousness or reduced Glasgow Coma Scale?     Other Active Diagnoses that may be contributing to signs of end organ dysfunction (Ex. Chronic kidney disease, cirrhosis):   - ________________________________________________________________      C.  Did you check any of the boxes above?     []  No, Sepsis Screen Negative   []  YES:  A) Infection + B) SIRS + C) Acute Organ Dysfunction = Positive Screen for Severe Sepsis. Notify attending (house officer during off-hours).  Notify Attending/House Garment/textile technologist and document in Complex Assessment under provider notification   - Name of physician notified:                                           - Date/Time Notifiied:                                             Document actions:   _0  Lactate drawn   _1  Blood Cultures obtained   _2  Antibiotics initiated or modified   _3   IV Fluid administered 0.9% NS __________ mLs given   Nursing Comments/Narrative:    - ______________________________________________________________     The Surviving Sepsis Guidelines recommend the following interventions to be  completed within one hour of a positive sepsis screen.   Obtain new blood cultures prior to antibiotic administration if not done within the last 24 hours.   Obtain lactate level, if initial lactate > 47mol, repeat lactate in 2 hours for goal decrease 10-20%; If there is not a decrease call [facility], hospital  If SBP < 90 or MAP < 65 or lactate greater than 4 mmol/dl; Start 0.9% NS IV Fluid Bolus of 30 mL/Kg (minimum) to maintain MAP > 65    Initiate vasopressors for hypotension not responding to fluid resuscitation (1st line Norepinephrine 1 - 300 mcg/min IV) - Patient must be in CCU if requires vasopressor   Initiate or escalate antibiotic therapy   Goal urine output greater than/equal to 0.5 mL/kg/hr

## 2014-11-17 NOTE — Plan of Care (Signed)
Problem: Safety  Goal: Patient will be free from injury during hospitalization  Outcome: Progressing  Safety protocol in place. Call light within reach. Bed in low position. Hourly rounding continued.    Problem: Pain  Goal: Patient's pain/discomfort is manageable  Outcome: Progressing  Patient denies any pain. We will continue monitoring.

## 2014-11-17 NOTE — Progress Notes (Signed)
Therapy evals faxed to [facility] for pre-auth.  Grayling Congress, [place] [id number]

## 2014-11-17 NOTE — Plan of Care (Signed)
Problem: Day 2- Stroke  Goal: Neurological status is stable or improving  Outcome: Progressing  Patient alert and oriented x 4. GCS 15. Pupil @3  brisk. C/o of slight numbness and  tingling on the left upper and lower extremity.  Goal: Stable vital signs and fluid balance  Outcome: Progressing  VSS. SR/SB on the tele monitor. HR 55-70's.  Goal: Patient will maintain Adequate Oxygenation  Outcome: Progressing  On room Air o2 sats >96%.  Goal: Nutritional Status Improving  Outcome: Progressing  Patient on cardiac diet . Eating and drinking fine.  Goal: Elimination patterns are normal or improving  Outcome: Progressing  Avoiding without any problem.  Goal: Skin integrity is maintained or improved  Outcome: Progressing  Skin intact.  Goal: Effective coping demonstrated  Outcome: Progressing  Support provided.  Goal: Will be able to express needs and understand communication  Outcome: Progressing  Patient able to express needs.

## 2014-11-17 NOTE — PT Progress Note (Signed)
Physical Therapy Note    Patient off floor for testing. Will f/u as schedule permits.  Emi Belfast, PT  11/17/2014  11:41 AM  [phone]

## 2014-11-17 NOTE — PT Progress Note (Signed)
Physical Therapy Note    [facility]  [address]  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    Assessment   Patient with improving awareness of his deficits. Gait much improved, supervision to walk in room 10'x6- visual field deficits continue to pose problem as he is not scanning and runs into objects if close to left side. Focused on balance challenges, continue to have decreased balance both static and dynamic. Discussed at length acute rehab.    Plan   Recommendation  Discharge Recommendation: Acute Rehab  DME Recommended for Discharge:  (TBA)  PT Frequency: 3-4x/wk    If Acute rehab recommended d/c disposition is not available, patient will need supervision and continued PT/OT.    Continue plan of care.    Interdisciplinary Communication: with RN      Subjective     Patient is agreeable to participation in the therapy session. Nursing clears patient for therapy.  Pain: 0/10    Vitals:   Filed Vitals:    11/17/14 0100 11/17/14 0500 11/17/14 0830 11/17/14 1256   BP: 149/82 144/79 162/86 141/86   Pulse: 59 54 67 66   Temp: 98.6 F (37 C) 98.1 F (36.7 C) 98.4 F (36.9 C) 98.5 F (36.9 C)   TempSrc: Oral Oral Oral Oral   Resp: 14 12 21 21    Height:       Weight:       SpO2: 97% 97% 97% 96%       Objective     Precautions/ Contraindications:  fall    Patient is in bed with  Telemetry in place.    Therapeutic exercises:  Ankle Pumps: 10  Hip abduction/adduction: 10  Straight leg raise: 10  Short Arc Quad: 10  Long Arc Quad: 10  Seated Marches:  10  Standing marches 20  Standing heel raises  20    Functional Mobility:  Rolling: independent  Supine to Sit: independent  Scooting: independent  Sit to Supine: independent  Sit to Stand: supervision  Stand to Sit: supervision  Transfer Training: SBA    Gait:   WB status: FWB  Assistive Device: none  Assist Level: supervision  Distance: 10'x6 in room  Pattern: not scanning to left- ran into table, gait pattern is functional   Stairs/Curbs: NT         Balance Training:  Standing tandem required min A to maintain 10 seconds, min A for standing exercises    Educated the patient to role of physical therapy, plan of care, goals  of therapy and safety with mobility and ADLs.    Patient left in chair with all needs met, equipment intact and call bell within reach. RN notified of session outcome.   Mobility status posted at bedside and within E.M.R.    Goals per Eval/ Re-eval:   Goals  Goal Formulation: With patient  Time for Goal Acheivement: 5 visits  Goals: Select goal  Pt Will Perform Sit to Stand: with stand by assist  Pt Will Transfer Bed/Chair: with stand by assist  Pt Will Ambulate: > 200 feet  Pt Will Go Up / Down Stairs: 1 flight, with contact guard assist, With rail  Other Goal: Pt retrieve an item from the floor independently,;  no LOB    Signature:  Emi Belfast, PT  11/17/2014 4:07 PM   Phone: [phone]

## 2014-11-17 NOTE — Plan of Care (Signed)
Problem: Day 3- Stroke  Goal: Neurological status is stable or improving  Outcome: Progressing  Still poor peripheral vision on the left eye.Safety maintained.  Goal: Stable vital signs and fluid balance  Outcome: Progressing  Stable at this time.  Goal: Patient will maintain Adequate Oxygenation  Outcome: Progressing  Pt remains on room air at this time.  Goal: Nutritional Status Improving  Outcome: Progressing  Fair appetite with meals.Pt encouraged to eat small frequent meals.  Goal: Elimination patterns are normal or improving  Outcome: Progressing  Pt had his last bowel movement yesterday.  Goal: Mobility/activity is maintained at optimum level for patient  Outcome: Progressing  Ataxic gait fairly improving today during therapy.  Goal: Skin integrity is maintained or improved  Outcome: Progressing  Skin remains intact at this time.  Goal: Neurovascular Status is Stable or Improving  Outcome: Progressing  Stable at this time.positive palpable pulses.  Goal: Effective coping demonstrated  Outcome: Progressing  Fairly coping with current hospitalization.  Goal: Patient/Patient Care Companion demonstrates understanding on disease process, treatment plan, medications and discharge plan  Outcome: Progressing  Family/patient receiving ongoing education per stroke protocol.

## 2014-11-17 NOTE — Progress Notes (Signed)
Date Time: 11/17/2014 2:07 PM  Patient Name: Blake Sheer, MD  Attending Physician: Arna Medici, MD  Patient Class: Inpatient  Hospital Day: 2            NEUROLOGY PROGRESS NOTE             Assessment/Plan   1. Acute right PCA stroke, involving the occipital lobe, and the thalamic region.  Likely secondary to atherosclerotic disease.  2. Hypertension.  3. Hyperlipidemia.  4. Prior history of migraines with aura  5. Left carotid high-grade stenosis.  6. Basilar stenosis  7. MCA aneurysm  8.     Continue aspirin and statin.  Appreciate  Cardiology input   Wait on echocardiogram.  Wait on placement to acute rehab   Permissive hypertension for now       Subjective:   Patient Seen and Examined.  No new changes.  No headache, visual disturbance continues     Review of Systems:   No headache, eye, ear nose, throat problems; no coughing or wheezing or shortness of breath, No chest pain or orthopnea, no abdominal pain, nausea or vomiting, No pain in the body or extremities, no psychiatric, neurological, endocrine, hematological or cardiac complaints except as noted above.        Physical Exam:   BP 141/86 mmHg  Pulse 66  Temp(Src) 98.5 F (36.9 C) (Oral)  Resp 21  Ht 1.88 m (6\' 2" )  Wt 108 kg (238 lb 1.6 oz)  BMI 30.56 kg/m2  SpO2 96%    Neuro:  Level of consciousness:  Alert and appropriate .  Speech is fluent  Oriented:  X 3  Facial Movements: symmetric  pupils are equal and reactive .  Mildly disconjugate gaze      Meds:      Scheduled Meds: PRN Meds:        amLODIPine 10 mg Oral Daily   aspirin 325 mg Oral Daily   enoxaparin 40 mg Subcutaneous Q24H   famotidine 20 mg Oral Q12H SCH   rosuvastatin 20 mg Oral QHS       Continuous Infusions:                 Labs:       Recent Labs  Lab 11/16/14  0356 11/15/14  1609   GLUCOSE 101* 146*   BUN 23 27   CREATININE 1.1 1.5*   CALCIUM 9.4 10.6*   SODIUM 142 141   POTASSIUM 4.0 4.2   CHLORIDE 111 106   CO2 23 23   ALBUMIN  --  4.9   AST (SGOT)  --  28   ALT  --  49    BILIRUBIN, TOTAL  --  0.5   ALKALINE PHOSPHATASE  --  83       Recent Labs  Lab 11/15/14  1608   WBC 8.70   HEMOGLOBIN 18.1*   HEMATOCRIT 51.4   MCV 89.9   MCH, POC 31.6   MCHC 35.2   PLATELETS 259         Recent Labs      11/15/14   1608   PTT  24   PT  12.9   PT INR  1.0          Radiology Results (24 Hour)     ** No results found for the last 24 hours. **           All brain imaging (MRI, CT) personally reviewed.    Case discussed with: Cardiology  Signed by: Dorothea Glassman, MD  [place]: [id number]      Answering Service: [phone]

## 2014-11-17 NOTE — Plan of Care (Signed)
The patient and care giver's learning abilities have been assessed. Today's individualized plan of care for day three stroke protocol was discussed with the patient and care giver and agree to it. Patient and care giver demonstrates understanding of disease process, treatment plan, medications and consequences of noncompliance. All questions and concerns were addressed.Pt encouraged by physicians,therapist,and nursing staff to consider going to acute rehab for follow up treatment.Will continue with current plan of care.

## 2014-11-17 NOTE — Progress Notes (Signed)
[facility]  PROGRESS NOTE      Date Time: 11/17/2014 11:24 AM  Patient Name: [name], MD    Signed by: Leeanne Mannan Makaelah Cranfield    [facility])  [phone]  MD LINE [phone]    Assessment:   7 M with HTN, HL, carotid stenosis, pw acute stroke.     Plan:   1. Acute CVA.  Likely 2/2 atherosclerotic disease.  Echo with bubble pending.  Cont to follow on tele.   2. Carotid stenosis.  Significant L carotid stenosis.  Eventual revascularization?  Not the etiology of his acute CVA.  Cont ASA, statin  3. HTN.  Uncontrolled.  Permissive HTN.  Cont to follow.  4. HL.  Uncontrolled.  Started on statin.     Patient Active Problem List   Diagnosis   . Loss of peripheral visual field, left   . Acute CVA (cerebrovascular accident)   . Essential hypertension       Subjective:   Denies chest pain, SOB or palpitations.    Medications:   Medications reviewed    Current Medications   Current Facility-Administered Medications   Medication Dose Route Frequency   . amLODIPine  10 mg Oral Daily   . aspirin  325 mg Oral Daily   . enoxaparin  40 mg Subcutaneous Q24H   . famotidine  20 mg Oral Q12H SCH   . rosuvastatin  20 mg Oral QHS              Physical Exam:     Filed Vitals:    11/17/14 0830   BP: 162/86   Pulse: 67   Temp: 98.4 F (36.9 C)   Resp: 21   SpO2: 97%     Temp (24hrs), Avg:98.2 F (36.8 C), Min:97.8 F (36.6 C), Max:98.6 F (37 C)    Intake and Output Summary (Last 24 hours) at Date Time    Intake/Output Summary (Last 24 hours) at 11/17/14 1124  Last data filed at 11/17/14 0830   Gross per 24 hour   Intake      0 ml   Output   1651 ml   Net  -1651 ml     Wt Readings from Last 3 Encounters:   11/15/14 108 kg (238 lb 1.6 oz)     Vital signs reviewed    Telemetry reviewed: no sig events    General Appearance:  Breathing comfortable, no acute distress  Head:  normocephalic  Eyes:  EOM's intact, nonicteric sclera  Neck:  No carotid bruit or jugular venous distension  Lungs:  Clear to  auscultation throughout, no wheezes, rhonchi or rales, good respiratory effort   Cardiac:  RRR, normal S1, S2, no S3, no S4, no rub, no murmurs   Abdomen:  Soft, non-tender, positive bowel sounds  Extremities:  No cyanosis or clubbing.  No LE edema  Vascular: 2+ radial and distal pulses bilaterally  Neurologic:  Alert and oriented x3, mood and affect normal    Labs:   Labs reviewed    CBC w/Diff     Recent Labs  Lab 11/15/14  1608   WBC 8.70   HEMOGLOBIN 18.1*   HEMATOCRIT 51.4   PLATELETS 259          Basic Metabolic Profile     Recent Labs  Lab 11/16/14  0356 11/15/14  1609   SODIUM 142 141   POTASSIUM 4.0 4.2   CHLORIDE 111 106   CO2 23 23  BUN 23 27   CREATININE 1.1 1.5*   EGFR >60.0 47.2   GLUCOSE 101* 146*   CALCIUM 9.4 10.6*            Cardiac Enzymes     Recent Labs  Lab 11/15/14  1608   TROPONIN I <0.01          Thyroid Studies   No results for input(s): TSH, FREET3 in the last 168 hours.    Invalid input(s): FREET4       Cholesterol Panel     Recent Labs  Lab 11/16/14  0356   CHOLESTEROL 235*   TRIGLYCERIDES 210*   HDL 31*   CALCULATED LDL 162*          Coagulation Studies     Recent Labs  Lab 11/15/14  1608   PT 12.9   PT INR 1.0   PTT 24              Imaging:

## 2014-11-17 NOTE — Plan of Care (Signed)
Problem: Safety  Goal: Patient will be free from injury during hospitalization  Outcome: Progressing  Pt at risk for falls d/t visual deficits    Problem: Day 3- Stroke  Goal: Neurological status is stable or improving  Outcome: Progressing  Pt remains stable neurologically - continues to have peripheral visual deficit and numbness w/tingling in Lt upper arm and lt thigh - reports no difference in these deficits for last several days - has had no seizure activity -   Goal: Stable vital signs and fluid balance  Outcome: Progressing  VSS- in SR on tele- able to tol thin liquids and solid foods -   Intervention: Position patient for maximum circulation / cardiac output.  Pt is independent in positioning self - able to turn and adjust self -   Intervention: Monitor intake and output. Notify LIP if urine output is < 30 ml/hr.  I&O documented on flow sheets

## 2014-11-17 NOTE — Progress Notes (Signed)
[facility] following.  Will F/U with [employer] for precert on Monday.  Pt is accepted into [facility] once precert obtained.  Grayling Congress, [place] [id number]

## 2014-11-17 NOTE — Progress Notes (Addendum)
MEDICINE PROGRESS NOTE  [facility], DIVISION OF HOSPITALIST MEDICINE   [facility] [place]   [place] Pager: [phone]      Date Time: 11/17/2014 4:44 PM  Patient Name: [name], MD  Attending Physician: Arna Medici, MD  Hospital Day: 3  Assessment:     Active Hospital Problems    Diagnosis   . Loss of peripheral visual field, left   . Acute CVA (cerebrovascular accident)   . Essential hypertension   Dyslipidemia   Dehydration and acute renal insuffiencey  Left MSA aneurysm     Plan:   Pleasant 63 year old gentleman who presented with several days history of left-sided numbness along with headache and decrease in visual field, noted to have an acute Rt occipital CVA. 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     Continue aspirin and high-dose statin  Patient does not tolerate atorvastatin well  and after much discussion of risks / benefits and efficacy along with stroke guidelines, it was determined that he will be best served by starting Crestor, will monitor for any side effects     PT, OT and speech therapy evaluation noted, recommend acute rehab, Patient initially resistant but after prompting is considering to proceed with that.  Eventual out pt f/u for Left CEA   Allow permissive elevation of blood pressure, we'll hold losartan for now. Continue to assess and modify risk factors as appropriate, follow with echo   Monitor renal function     Case discussed with: Pt, staff, consultant     Safety Checklist:     DVT prophylaxis:  CHEST guideline (See page [id number]) Chemical and Mechanical   Foley:  [place] Rn Foley protocol Not present   IVs:  Peripheral IV   PT/OT: Ordered   Daily CBC & or Chem ordered:  SHM/ABIM guidelines (see #5) No     Lines:     Patient Lines/Drains/Airways Status    Active PICC Line / CVC Line / PIV Line / Drain / Airway / Intraosseous Line / Epidural Line / ART Line / Line / Wound / Pressure Ulcer / NG/OG Tube     Name:    Placement date:   Placement time:   Site:   Days:    Peripheral IV 11/15/14 Right Antecubital  11/15/14   1535   Antecubital   1                 Disposition:     Today's date: 11/17/2014  Length of Stay: 2  Anticipated medical stability for discharge: December,  13 - Morning  Reason for ongoing hospitalization: Acute CVA  Anticipated discharge needs: rehab    Subjective     CC: Acute CVA (cerebrovascular accident)  Interval History/24 hour events:     HPI/Subjective: Patient does not feel any changes in his left-sided facial and extremity numbness and left hemianopsia is also unchanged.  Continues with mild to moderate headache.      Review of Systems:   Review of Systems - Negative except as above in HPI    Physical Exam:     Temp:  [98.1 F (36.7 C)-98.6 F (37 C)] 98.5 F (36.9 C)  Heart Rate:  [54-81] 66  Resp Rate:  [12-21] 21  BP: (141-165)/(76-87) 141/86 mmHg    Intake/Output Summary (Last 24 hours) at 11/17/14 1644  Last data filed at 11/17/14 0830   Gross per 24 hour   Intake  0 ml   Output   1651 ml   Net  -1651 ml    General: awake, alert  Caucasian male,in no acute distress  Cardiovascular: regular rate and rhythm, no murmurs, rubs or gallops  Lungs: clear to auscultation bilaterally, no additional sounds  Abdomen: soft, non-tender, non-distended; normoactive bowel sounds  Extremities: no edema  Neurological: Alert and oriented X 3, moves all extremities. Reduced sensation and poor coordination on left side, left hemianopsia        Meds:   Medications were reviewed:  Scheduled Meds:  Current Facility-Administered Medications   Medication Dose Route Frequency   . amLODIPine  10 mg Oral Daily   . aspirin  325 mg Oral Daily   . enoxaparin  40 mg Subcutaneous Q24H   . famotidine  20 mg Oral Q12H SCH   . rosuvastatin  20 mg Oral QHS     Continuous Infusions:     PRN Meds:.  Labs:       Recent Labs  Lab 11/15/14  1608   WBC 8.70   HEMOGLOBIN 18.1*   HEMATOCRIT 51.4   PLATELETS 259      Recent  Labs  Lab 11/15/14  1608   PT 12.9   PT INR 1.0   PTT 24         Recent Labs  Lab 11/16/14  0356 11/15/14  1609   SODIUM 142 141   POTASSIUM 4.0 4.2   CHLORIDE 111 106   CO2 23 23   BUN 23 27   CREATININE 1.1 1.5*   EGFR >60.0 47.2   GLUCOSE 101* 146*   CALCIUM 9.4 10.6*      Recent Labs  Lab 11/15/14  1609   ALKALINE PHOSPHATASE 83   BILIRUBIN, TOTAL 0.5   PROTEIN, TOTAL 7.9   ALBUMIN 4.9   ALT 49   AST (SGOT) 28        Imaging personally reviewed, including: all available  No results found.      Signed by: Arna Medici, MD

## 2014-11-18 LAB — ECG 12-LEAD
Atrial Rate: 80 {beats}/min
P Axis: 55 degrees
P-R Interval: 198 ms
Q-T Interval: 384 ms
QRS Duration: 98 ms
QTC Calculation (Bezet): 442 ms
R Axis: -22 degrees
T Axis: 60 degrees
Ventricular Rate: 80 {beats}/min

## 2014-11-18 NOTE — Progress Notes (Signed)
MEDICINE PROGRESS NOTE  [facility], DIVISION OF HOSPITALIST MEDICINE   [facility]   [place] Pager: [phone]      Date Time: 11/18/2014 6:15 PM  Patient Name: Blake Sheer, MD  Attending Physician: Mordecai Maes, MD  Hospital Day: 4  Assessment:     Active Hospital Problems    Diagnosis   . Loss of peripheral visual field, left   . Acute CVA (cerebrovascular accident)   . Essential hypertension   Dyslipidemia   Acute renal insuffiencey  Left MCA aneurysm     Plan:   Left acute left occipital CVA:      Also incidentally noted to have left carotid artery stenosis and aneurysm of distal left M1.   Continue aspirin and high-dose statin  PT, OT and speech therapy evaluation noted, recommend acute rehab  Awaiting acute rehab placement  Eventual out pt f/u for Left CEA   Allow permissive elevation of blood pressure, we'll hold losartan for now. Continue to assess and modify risk factors as appropriate, follow with echo   Monitor renal function     Case discussed with: Pt, staff  Safety Checklist:     DVT prophylaxis:  CHEST guideline (See page e199S) Chemical and Mechanical   Foley:  [place] Rn Foley protocol Not present   IVs:  Peripheral IV   PT/OT: Ordered   Daily CBC & or Chem ordered:  SHM/ABIM guidelines (see #5) No     Lines:     Patient Lines/Drains/Airways Status    Active PICC Line / CVC Line / PIV Line / Drain / Airway / Intraosseous Line / Epidural Line / ART Line / Line / Wound / Pressure Ulcer / NG/OG Tube     Name:   Placement date:   Placement time:   Site:   Days:    Peripheral IV 11/15/14 Right Antecubital  11/15/14   1535   Antecubital   1                 Disposition:     Today's date: 11/18/2014  Length of Stay: 3  Anticipated medical stability for discharge: December,  13 - Morning  Reason for ongoing hospitalization: Acute CVA  Anticipated discharge needs: rehab    Subjective     CC: Acute CVA (cerebrovascular accident)  Interval History/24 hour events:      HPI/Subjective: Patient does not feel any changes in his left-sided facial and extremity numbness and left hemianopsia is also unchanged.  Continues with mild to moderate headache.      Review of Systems:   Review of Systems - Negative except as above in HPI    Physical Exam:     Temp:  [97.8 F (36.6 C)-98.4 F (36.9 C)] 98.2 F (36.8 C)  Heart Rate:  [54-66] 66  Resp Rate:  [13-22] 22  BP: (129-158)/(67-93) 129/88 mmHg  No intake or output data in the 24 hours ending 11/18/14 1815 General: awake, alert  Caucasian male,in no acute distress  Cardiovascular: regular rate and rhythm, no murmurs, rubs or gallops  Lungs: clear to auscultation bilaterally, no additional sounds  Abdomen: soft, non-tender, non-distended; normoactive bowel sounds  Extremities: no edema  Neurological: Alert and oriented X 3, moves all extremities. Reduced sensation and poor coordination on left side, left hemianopsia        Meds:   Medications were reviewed:  Scheduled Meds:  Current Facility-Administered Medications   Medication Dose Route Frequency   . amLODIPine  10  mg Oral Daily   . aspirin  325 mg Oral Daily   . enoxaparin  40 mg Subcutaneous Q24H   . famotidine  20 mg Oral Q12H SCH   . rosuvastatin  20 mg Oral QHS     Continuous Infusions:     PRN Meds:.  Labs:       Recent Labs  Lab 11/15/14  1608   WBC 8.70   HEMOGLOBIN 18.1*   HEMATOCRIT 51.4   PLATELETS 259      Recent Labs  Lab 11/15/14  1608   PT 12.9   PT INR 1.0   PTT 24         Recent Labs  Lab 11/16/14  0356 11/15/14  1609   SODIUM 142 141   POTASSIUM 4.0 4.2   CHLORIDE 111 106   CO2 23 23   BUN 23 27   CREATININE 1.1 1.5*   EGFR >60.0 47.2   GLUCOSE 101* 146*   CALCIUM 9.4 10.6*      Recent Labs  Lab 11/15/14  1609   ALKALINE PHOSPHATASE 83   BILIRUBIN, TOTAL 0.5   PROTEIN, TOTAL 7.9   ALBUMIN 4.9   ALT 49   AST (SGOT) 28        Imaging personally reviewed, including: all available  No results found.      Signed by: Mordecai Maes, MD

## 2014-11-18 NOTE — Plan of Care (Signed)
Problem: Safety  Goal: Patient will be free from injury during hospitalization  Outcome: Progressing  Pt's call light placed within reach, bed in lowest position and locked, pt instructed to use call light for assistance.     Problem: Pain  Goal: Patient's pain/discomfort is manageable  Outcome: Progressing  Pt denied pain.     Problem: Day 3- Stroke  Goal: Neurological status is stable or improving  Outcome: Progressing  Pt is oriented to PPT, left eye is reported to have peripheral visual loss.   Goal: Elimination patterns are normal or improving  Outcome: Progressing  Pt denied constipation, passing gas, no BM noted yet.    Problem: Day 4- Stroke  Goal: Neurological status is stable or improving  Outcome: Progressing  Pt is oriented to PPT, left eye is reported to have peripheral visual loss.   Goal: Elimination patterns are normal or improving  Outcome: Progressing  Pt denied constipation, passing gas, no BM noted yet.  Goal: Stable vital signs and fluid balance  Outcome: Progressing  Pt's VSS, in NSR  Goal: Patient will maintain Adequate Oxygenation  Outcome: Progressing  Pt on room air, denied SOB, lung sounds are clear.   Goal: Patients risk of aspiration will be minimized  Outcome: Progressing  Pt denied difficulty with eating, chewing, or swallowing food.   Goal: Nutritional Status Improving  Outcome: Progressing  Pt was ordered an early lunch tray, breakfast was missed and not sent up in am.   Goal: Mobility/activity is maintained at optimum level for patient  Outcome: Progressing  Pt required minimal assistance with gait and active ROM noted.   Goal: Skin integrity is maintained or improved  Outcome: Progressing  Skin intact.   Goal: Neurovascular Status is Stable or Improving  Outcome: Progressing  Pt on SQ lovenox, refused SCD's.  Goal: Effective coping demonstrated  Outcome: Progressing  Pt able to express needs and concerns.   Goal: Will be able to express needs and understand communication  Outcome:  Progressing  Pt updated with his care and tx.   Goal: Patient/Patient Care Companion demonstrates understanding on disease process, treatment plan, medications and discharge plan  Outcome: Progressing  Pt had visitor today that brought in soup for him.

## 2014-11-18 NOTE — Progress Notes (Signed)
Date Time: 11/18/2014 2:34 PM  Patient Name: Blake Sheer, MD  Attending Physician: Mordecai Maes, MD  Patient Class: Inpatient  Hospital Day: 3            NEUROLOGY PROGRESS NOTE             Assessment/Plan   1. Acute right PCA stroke, involving the occipital lobe, and the thalamic region.  Likely secondary to atherosclerotic disease.  2. Hypertension.  3. Hyperlipidemia.  4. Prior history of migraines with aura  5. Left carotid high-grade stenosis.  6. Basilar stenosis  7. MCA fusiform aneurysm      Continue aspirin and statin.  No major findings on echocardiogram  Should probably get CAD work up eventually  Ok to tx to rehab from neuro perspecive  Permissive HTN  Will need CEA L side eventually    Subjective:   Patient Seen and Examined.  No new changes.  No headache, visual disturbance continue    Review of Systems:   No headache, eye, ear nose, throat problems; no coughing or wheezing or shortness of breath, No chest pain or orthopnea, no abdominal pain, nausea or vomiting, No pain in the body or extremities, no psychiatric, neurological, endocrine, hematological or cardiac complaints except as noted above.      Physical Exam:   BP 157/88 mmHg  Pulse 54  Temp(Src) 98.4 F (36.9 C) (Oral)  Resp 18  Ht 1.88 m (6\' 2" )  Wt 108 kg (238 lb 1.6 oz)  BMI 30.56 kg/m2  SpO2 96%    Neuro:  Level of consciousness:  Alert and appropriate .  Speech is fluent  Oriented:  X 3  Facial Movements: symmetric  pupils are equal and reactive .  Mildly disconjugate gaze    L HH (can see just over the midline    Meds:      Scheduled Meds: PRN Meds:        amLODIPine 10 mg Oral Daily   aspirin 325 mg Oral Daily   enoxaparin 40 mg Subcutaneous Q24H   famotidine 20 mg Oral Q12H SCH   rosuvastatin 20 mg Oral QHS       Continuous Infusions:                 Labs:       Recent Labs  Lab 11/16/14  0356 11/15/14  1609   GLUCOSE 101* 146*   BUN 23 27   CREATININE 1.1 1.5*   CALCIUM 9.4 10.6*   SODIUM 142 141   POTASSIUM 4.0 4.2    CHLORIDE 111 106   CO2 23 23   ALBUMIN  --  4.9   AST (SGOT)  --  28   ALT  --  49   BILIRUBIN, TOTAL  --  0.5   ALKALINE PHOSPHATASE  --  83       Recent Labs  Lab 11/15/14  1608   WBC 8.70   HEMOGLOBIN 18.1*   HEMATOCRIT 51.4   MCV 89.9   MCH, POC 31.6   MCHC 35.2   PLATELETS 259         Recent Labs      11/15/14   1608   PTT  24   PT  12.9   PT INR  1.0          Radiology Results (24 Hour)     ** No results found for the last 24 hours. **           All  brain imaging (MRI, CT) personally reviewed.    Case discussed with: pt and family    Signed by: Ardelle Anton, MD  Spectralink: [phone]      Answering Service: [phone]

## 2014-11-18 NOTE — Progress Notes (Signed)
Severe Sepsis Screen    Date: 11/18/2014 Time: 10:15 AM  Nurse Signature: [facility] Jahmad Petrich    Exclusions:      Patients meeting the following criteria are excluded from screening:     []  Suspicion or diagnosis of sepsis is documented and until 72 hours after antibiotics started or last regimen change:   - If Yes, Date of Documented Sepsis:                                          - If Yes, Date of last change in antibiotics:                                           []  Surgery - No screening for 24 hours after surgery   - If Yes, Date of Surgery:                                           []  [place] hypothermia protocol- Resume screening when arctic sun complete   []  Comfort Care Orders- Do not resume screening    Did you check any of the boxes above?     [x]  No, Continue to section A   []  Yes, Stop Here, Patient Excluded from Sepsis Screening. If screening should resume in the future, place "sticky note to treatment team" with date/time of when screening should resume. Communicate patient excluded from screening due to Comfort Care Orders using "sticky note to treatment team."    A. Infection:      Does your patient have ONE or more of the following infection criteria?     []  Documented Infection - Does the patient have positive culture results (from blood, sputum, urine, etc)?   []  Anti-Infective Therapy - Is the patient receiving antibiotic, antifungal, or other anti-infective therapy?   []  Pneumonia - Is there documentation of pneumonia (X Ray, etc)?   []  WBC's - Have WBC's been found in normally sterile fluid (urine, CSF, etc.)?   []  Perforated Viscus - Does the patient have a perforated hollow organ (bowel)?    A.  Did you check any of the boxes above?     [x]  No, Stop Here and Sepsis Screen Negative   []  Yes, continue to section B    B. SIRS:      Does your patient have TWO or more of the following SIRS criteria?     []  Temperature - Is the patient's temperature: Temp: 98.2 F (36.8 C) (11/18/14  0345)   - Greater than or equal to 38.3 degrees C (greater than 100.9 degrees F)?   - Less than or equal to 36 degrees C (less than or equal to 96.8 degrees F)?    []  Heart Rate: Heart Rate: (!) 54 (11/18/14 0345)   - Is the patient's heart rate greater than or equal to 90 bpm?     []  Respiratory: Resp Rate: 18 (11/18/14 0345)   - Is the patient's respiratory rate greater than 20?     []  WBC Count - Is the patient's WBC count:   Recent Labs  Lab 11/15/14  1608   WBC 8.70       -  Greater than or equal to 12,000/mm3 OR   - Less than or equal to 4,000/mm3 OR    - Are there greater than 10% immature neutrophils (bands)?     []  Glucose >140 without diabetes or on steroids?   Recent Labs  Lab 11/16/14  0356   GLUCOSE 101*         []  Significant edema is present?    B.  Did you check two or more of the boxes above?     []  No, Stop Here and Sepsis Screen Negative   []  Yes, contact Charge Nurse, continue to section C    C. ACUTE Organ Dysfunction:      Does your patient have ONE or more of the following organ dysfunction? (May need to wait for lab results for assessment - see below) Organ dysfunction must be a result of the sepsis NOT CHRONIC conditions.     []  Cardiovascular - Does the patient have a: BP: 133/67 mmHg (11/18/14 0345)   - Systolic Blood Pressure less than or equal to 90 mmHg OR   - Systolic Blood Pressure has dropped 40 mmHg or more from baseline OR   - Mean Arterial Pressure less than or equal to 70 mmHg (for at least one hour despite fluid resuscitation OR   - require vasopressor support?     []  Respiratory - Does the patient have new hypoxia defined by any of the following?   - A sustained increase in oxygen requirements by at least 2L/min on NC or 28% FiO2 within the last 24 hrs OR   - A persistent decrease in oxygen saturation of greater than or equal to 5% lasting at least four or more hours and occurring within the last 24 hours     []  Renal - Does the patient have:   - low urine output (e.g.  Less than 0.5 mL/kg/HR for one hour despite adequate fluid resuscitation OR   - Increased creatinine (greater than 50% increase from baseline) OR   - require acute dialysis?     []  Hematologic - Does the patient have:   - Low platelet count (less than 100,000 mm3)   Recent Labs  Lab 11/15/14  1608   PLATELETS 259    OR   - INR/aPTT greater than upper limit of normal?   Recent Labs  Lab 11/15/14  1608   PT INR 1.0    OR No results for input(s): APTT in the last 168 hours.     []  Metabolic - Does the patient have a high lactate (plasma lactate greater than or equal to 2.4 mMol/L? No results for input(s): LACTATE in the last 168 hours.     []  Hepatic - Are the patient's liver enzymes elevated (ALT greater than 72 IU/L or Total Bilirubin greater than 2 MG/dL)?   Recent Labs  Lab 11/15/14  1609   BILIRUBIN, TOTAL 0.5   ALT 49         []  CNS - Does the patient have altered consciousness or reduced Glasgow Coma Scale?     Other Active Diagnoses that may be contributing to signs of end organ dysfunction (Ex. Chronic kidney disease, cirrhosis):   - ________________________________________________________________      C.  Did you check any of the boxes above?     []  No, Sepsis Screen Negative   []  YES:  A) Infection + B) SIRS + C) Acute Organ Dysfunction = Positive Screen for Severe Sepsis. Notify attending (house officer during off-hours).  Notify Attending/House Garment/textile technologist and document in Complex Assessment under provider notification   - Name of physician notified:                                           - Date/Time Notifiied:                                             Document actions:   _0  Lactate drawn   _1  Blood Cultures obtained   _2  Antibiotics initiated or modified   _3   IV Fluid administered 0.9% NS __________ mLs given   Nursing Comments/Narrative:    - ______________________________________________________________     The Surviving Sepsis Guidelines recommend the following interventions to be  completed within one hour of a positive sepsis screen.   Obtain new blood cultures prior to antibiotic administration if not done within the last 24 hours.   Obtain lactate level, if initial lactate > 47mol, repeat lactate in 2 hours for goal decrease 10-20%; If there is not a decrease call [facility], hospital  If SBP < 90 or MAP < 65 or lactate greater than 4 mmol/dl; Start 0.9% NS IV Fluid Bolus of 30 mL/Kg (minimum) to maintain MAP > 65    Initiate vasopressors for hypotension not responding to fluid resuscitation (1st line Norepinephrine 1 - 300 mcg/min IV) - Patient must be in CCU if requires vasopressor   Initiate or escalate antibiotic therapy   Goal urine output greater than/equal to 0.5 mL/kg/hr

## 2014-11-18 NOTE — Progress Notes (Signed)
[facility]  PROGRESS NOTE      Date Time: 11/18/2014 11:01 AM  Patient Name: Blake Sheer, MD    Signed by: Leeanne Mannan Early Ord    [facility])  [phone]  MD LINE [phone]    Assessment:   58 M with HTN, HL, carotid stenosis, pw acute stroke.     Plan:   1. Acute CVA.  Likely 2/2 atherosclerotic disease.  Echo WNL.  Cont to follow on tele.   2. Carotid stenosis.  Significant L carotid stenosis.  Eventual revascularization?  Not the etiology of his acute CVA.  Cont ASA, statin  3. HTN.  Permissive HTN.  Cont to follow.  4. HL.  Uncontrolled.  Started on statin.     Patient will likely need eventual revascularization of his L carotid artery.  Stress test was recommended to assess for CAD given his risk factors.  Await neuro recommendations when ok to pursue pharmacologic stress test.  Dispo planning for acute rehab.     Patient Active Problem List   Diagnosis   . Loss of peripheral visual field, left   . Acute CVA (cerebrovascular accident)   . Essential hypertension       Subjective:   Denies chest pain, SOB or palpitations.    Medications:   Medications reviewed    Current Medications   Current Facility-Administered Medications   Medication Dose Route Frequency   . amLODIPine  10 mg Oral Daily   . aspirin  325 mg Oral Daily   . enoxaparin  40 mg Subcutaneous Q24H   . famotidine  20 mg Oral Q12H SCH   . rosuvastatin  20 mg Oral QHS              Physical Exam:     Filed Vitals:    11/18/14 0900   BP: 157/88   Pulse:    Temp: 98.4 F (36.9 C)   Resp:    SpO2:      Temp (24hrs), Avg:98.3 F (36.8 C), Min:97.8 F (36.6 C), Max:98.5 F (36.9 C)    Intake and Output Summary (Last 24 hours) at Date Time  No intake or output data in the 24 hours ending 11/18/14 1101  Wt Readings from Last 3 Encounters:   11/15/14 108 kg (238 lb 1.6 oz)     Vital signs reviewed    Telemetry reviewed: no sig events    General Appearance:  Breathing comfortable, no acute distress  Head:   normocephalic  Eyes:  EOM's intact, nonicteric sclera  Neck:  No carotid bruit or jugular venous distension  Lungs:  Clear to auscultation throughout, no wheezes, rhonchi or rales, good respiratory effort   Cardiac:  RRR, normal S1, S2, no S3, no S4, no rub, no murmurs   Abdomen:  Soft, non-tender, positive bowel sounds  Extremities:  No cyanosis or clubbing.  No LE edema  Vascular: 2+ radial and distal pulses bilaterally  Neurologic:  Alert and oriented x3, mood and affect normal    Labs:   Labs reviewed    CBC w/Diff     Recent Labs  Lab 11/15/14  1608   WBC 8.70   HEMOGLOBIN 18.1*   HEMATOCRIT 51.4   PLATELETS 259          Basic Metabolic Profile     Recent Labs  Lab 11/16/14  0356 11/15/14  1609   SODIUM 142 141   POTASSIUM 4.0 4.2   CHLORIDE 111 106   CO2  23 23   BUN 23 27   CREATININE 1.1 1.5*   EGFR >60.0 47.2   GLUCOSE 101* 146*   CALCIUM 9.4 10.6*            Cardiac Enzymes     Recent Labs  Lab 11/15/14  1608   TROPONIN I <0.01          Thyroid Studies   No results for input(s): TSH, FREET3 in the last 168 hours.    Invalid input(s): FREET4       Cholesterol Panel     Recent Labs  Lab 11/16/14  0356   CHOLESTEROL 235*   TRIGLYCERIDES 210*   HDL 31*   CALCULATED LDL 162*          Coagulation Studies     Recent Labs  Lab 11/15/14  1608   PT 12.9   PT INR 1.0   PTT 24              Imaging:

## 2014-11-19 ENCOUNTER — Inpatient Hospital Stay: Payer: BLUE CROSS/BLUE SHIELD

## 2014-11-19 ENCOUNTER — Inpatient Hospital Stay: Payer: BLUE CROSS/BLUE SHIELD | Admitting: Hospice and Palliative Medicine

## 2014-11-19 ENCOUNTER — Inpatient Hospital Stay
Admission: RE | Admit: 2014-11-19 | Discharge: 2014-11-27 | DRG: 057 | Disposition: A | Discharge status: Home / Self Care | Payer: BLUE CROSS/BLUE SHIELD | Source: Other Acute Inpatient Hospital | Attending: Hospice and Palliative Medicine | Admitting: Hospice and Palliative Medicine

## 2014-11-19 DIAGNOSIS — H5340 Unspecified visual field defects: Secondary | ICD-10-CM | POA: Diagnosis present

## 2014-11-19 DIAGNOSIS — E663 Overweight: Secondary | ICD-10-CM | POA: Diagnosis present

## 2014-11-19 DIAGNOSIS — Z6830 Body mass index (BMI) 30.0-30.9, adult: Secondary | ICD-10-CM

## 2014-11-19 DIAGNOSIS — I69398 Other sequelae of cerebral infarction: Secondary | ICD-10-CM

## 2014-11-19 DIAGNOSIS — I69320 Aphasia following cerebral infarction: Secondary | ICD-10-CM

## 2014-11-19 DIAGNOSIS — I69354 Hemiplegia and hemiparesis following cerebral infarction affecting left non-dominant side: Principal | ICD-10-CM

## 2014-11-19 DIAGNOSIS — I69893 Ataxia following other cerebrovascular disease: Secondary | ICD-10-CM

## 2014-11-19 DIAGNOSIS — I6931 Cognitive deficits following cerebral infarction: Secondary | ICD-10-CM

## 2014-11-19 DIAGNOSIS — G43909 Migraine, unspecified, not intractable, without status migrainosus: Secondary | ICD-10-CM | POA: Diagnosis present

## 2014-11-19 DIAGNOSIS — F418 Other specified anxiety disorders: Secondary | ICD-10-CM | POA: Diagnosis present

## 2014-11-19 DIAGNOSIS — E785 Hyperlipidemia, unspecified: Secondary | ICD-10-CM | POA: Diagnosis present

## 2014-11-19 DIAGNOSIS — I10 Essential (primary) hypertension: Secondary | ICD-10-CM | POA: Diagnosis present

## 2014-11-19 DIAGNOSIS — I639 Cerebral infarction, unspecified: Secondary | ICD-10-CM

## 2014-11-19 DIAGNOSIS — I69393 Ataxia following cerebral infarction: Secondary | ICD-10-CM

## 2014-11-19 DIAGNOSIS — I671 Cerebral aneurysm, nonruptured: Secondary | ICD-10-CM | POA: Diagnosis present

## 2014-11-19 LAB — BASIC METABOLIC PANEL
Anion Gap: 8 (ref 5.0–15.0)
BUN: 22 mg/dL (ref 9–28)
CO2: 24 mEq/L (ref 22–29)
Calcium: 9.5 mg/dL (ref 8.5–10.5)
Chloride: 108 mEq/L (ref 100–111)
Creatinine: 1.1 mg/dL (ref 0.7–1.3)
Glucose: 100 mg/dL (ref 70–100)
Potassium: 4 mEq/L (ref 3.5–5.1)
Sodium: 140 mEq/L (ref 136–145)

## 2014-11-19 LAB — ANA PANEL
ANA Qualitative: NEGATIVE
ANA RNP: 31 (ref 0–99)
Anti-DNA (DS) Antibody Quantitative: 51 (ref 0–99)
Centromere Interpretation: NEGATIVE
Centromere: 13 (ref 0–99)
Histone Interpretation: NEGATIVE
Histone: 23 (ref 0–99)
Jo-1 Interpretation: NEGATIVE
Jo-1: 27 (ref 0–99)
Ribonucleoprotein (RNP) Antibody Interpretation: NEGATIVE
SM Interpretation: NEGATIVE
SM: 28 (ref 0–99)
SSA Interpretation: NEGATIVE
Scleroderma (Scl-70) Antibody Interpretation: NEGATIVE
Scleroderma SCL-70: 42 (ref 0–99)
Sjogrens SSA (Ro) Antibody: 21 (ref 0–99)
Sjogrens SSB (La) Antibody Interpretation: NEGATIVE
Sjogrens SSB (La) Antibody: 35 (ref 0–99)
dsDNA Interpretation: NEGATIVE

## 2014-11-19 LAB — GFR: EGFR: >60

## 2014-11-19 MED ORDER — ASPIRIN 81 MG PO CHEW
324.00 mg | CHEWABLE_TABLET | Freq: Every day | ORAL | Status: DC
Start: 2014-11-20 — End: 2014-11-27
  Administered 2014-11-20 – 2014-11-27 (×8): 324 mg via ORAL
  Filled 2014-11-19 (×8): qty 4

## 2014-11-19 MED ORDER — DOCUSATE SODIUM 100 MG PO CAPS
100.00 mg | ORAL_CAPSULE | Freq: Two times a day (BID) | ORAL | Status: DC
Start: 2014-11-19 — End: 2014-11-27
  Administered 2014-11-19 – 2014-11-27 (×16): 100 mg via ORAL
  Filled 2014-11-19 (×16): qty 1

## 2014-11-19 MED ORDER — ROSUVASTATIN CALCIUM 10 MG PO TABS
20.0000 mg | ORAL_TABLET | Freq: Every evening | ORAL | Status: DC
Start: 2014-11-19 — End: 2014-11-27
  Administered 2014-11-19 – 2014-11-26 (×8): 20 mg via ORAL
  Filled 2014-11-19 (×8): qty 2

## 2014-11-19 MED ORDER — AMLODIPINE BESYLATE 5 MG PO TABS
10.0000 mg | ORAL_TABLET | Freq: Every day | ORAL | Status: DC
Start: 2014-11-20 — End: 2014-11-20
  Administered 2014-11-20: 10 mg via ORAL
  Filled 2014-11-19: qty 2

## 2014-11-19 MED ORDER — LOSARTAN POTASSIUM 25 MG PO TABS
50.0000 mg | ORAL_TABLET | Freq: Every day | ORAL | Status: DC
Start: 2014-11-20 — End: 2014-11-19

## 2014-11-19 MED ORDER — LOSARTAN POTASSIUM 25 MG PO TABS
50.0000 mg | ORAL_TABLET | Freq: Every day | ORAL | Status: DC
Start: 2014-11-20 — End: 2014-11-20
  Administered 2014-11-20: 50 mg via ORAL
  Filled 2014-11-19: qty 2

## 2014-11-19 NOTE — Progress Notes (Signed)
Pt was discharge to 5 C, room 558. Pt was discharge with all belongings and report was given to [facility]. No distress noted.

## 2014-11-19 NOTE — Rehab Liaison Note ([facility]) (Signed)
[name]  MRN: [id number]  Account: [id number]  Session Start: 11/19/2014 12:00:00 AM  Session Stop: 11/19/2014 12:00:00 AM    Clinical Liaison  Inpatient Rehabilitation Pre Admission Screen    Medical Diagnosis:  CVA  Rehab Diagnosis: CVA (Right Brain)  Probable Impairment Group Code: 01.1 Left Body Involvement (Right Brain)  Demographics:   Age: 63Y   Gender: Male   Name, phone and relationship of contact person:  Contact Name: Blake [name]  Phone:   [phone]  Relationship:   Spouse.  Contact Name: Blake [name]  Phone:   [phone]  Relationship:   Child   Guardian/Power of Attorney:   It is unknown whether the patient has a Advertising account planner.   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  Wife will support and assist on discharge.  Pre-hospital Living Environment: Social History:  Lives with spouse in a house.  Entry Steps: 5 Rails: yes Inside steps: flight Rails: yes  Equipment at home: none  Prior Level of Function: Pt independent w/ ADLs, IADLs, functional mobility,  drives. Currently practices as neurologist.  Language:  English  Hand Dominance: Right.    The following information was gathered for consideration and maintenance in the  medical record to substantiate medical necessity for an IRF level of care.    This assessment is being completed by Grayling Congress , CRRN . Patient is  currently at [facility].    The patient is being referred and recommended by their physician, Dr.  Francesco Sor,  Neurologist  Dr. Delsa Grana Attending, to be assessed both medically and functionally in regard to  their premorbid functional capacity to determine whether they can benefit from a  rehabilitation level of care offered by our facility.    The following is information regarding the medical complexity and clinical risk  factors that needs to be considered for the appropriate management of the  patient's care and recovery.  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  New onset of visual disturbance, left face, arm and leg numbness(ongoing  since 1 week). 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 Cannot  rule out migrainous infarction, given prior history of migraines.  2.  Mild to moderate headache.  3.  Hypertension by history  4.  Prior history of migraines with aura.    MRI of the brain revealed a Right Brain CVA and angiogram of the neck revealed:  Localized stenosis of over 90% of the proximal left internal carotid  artery.    Patient has hypertension, left neglect, left visual impairment, hyperlipidemia,  L carotid artery stenosis, moderate cognitive impairments.    Pt was independent prior and would benefit from  Acute Rehab in order to return  to PLOF.    Supportive wife who will be there for patient on discharge.   Date of Onset: 11/15/14   Date Admitted to Acute:  11/15/14  Current Precautions:   Hypertension precautions.   Risk for falls.   Skin fragility.   Risk of aspiration/swallowing. Purree and soft diet  Food Allergies  No known food allergies.  Medication Allergies   No known medication allergies.  Other Allergies Not applicable.    Present Systems Summary:  Maximum  Temperature (last 48 hrs):  98.5 degrees.  Pulse:  63 beats per minute.  Respirations:  12 breaths per minute.  Blood Pressure:   148/87 mmHg.  Pulse Ox: 96% on room air  Height:   -  Weight:   238 lbs  Hearing: No current issues  Vision: Patient has vision issues. Wears glasses. Peripheral vision deficits  Left eye  Other Medical Comments:  DIET: Type: Cardiac.  Current Diet Texture: Regular diet.  Current Liquid Consistency: Thin liquids.  Bladder: Patient is continent of bladder.  Bowel: Patient does not have an ostomy.  Date of Last Bowel Movement:  Patient is continent of bowel.  Integumentary:   Intact.  Cardiopulmonary:   Room Air.  Dialysis: Patient currently is not receiving dialysis.  Current Medication(s): 11/19/14     Norvasc 10 mg PO Daily  Aspirin 325 mg PO Daily  Lovenox 40 mg SC Q24h  Pepcid 20 mg PO Q12h  Crestor 20 mg PO at bedtime  Current Alcohol Use:  Patient does not consume alcohol  Current Tobacco Use:  No, patient does not use tobacco  Current Drug Use: No, patient does not use recreational drugs.  Pain: Patient currently without complaints of pain.    Additional medical documentation contributing to the expected care of this  patient may be noted in the following areas:  Laboratory-Chemistry/Hematology:   11/15/14  WBC 8.70, Hgb 18.1, Hct 51.4    11/19/14 Chemistry  Na 140, K+ 4.0, Chl 108, C02 24, BUN 22, Creatinine 1.1, Gluc 100    11/15/14 INR 1.0    11/16/14 Lipids  Cholesterol 235, HDL 31, LDL 162, Triglycerides 210  Cultures:   Not applicable.  Radiology:   MRI: MRI Brain 11/15/14  IMPRESSION:  1. Large area of restricted diffusion throughout the right occipital  lobe acute/subacute infarction corresponding to the posterior cerebral  artery territory.  2. Atrophy and chronic small vessel ischemic changes.   Angiogram Neck 11/15/14  IMPRESSION:  Localized stenosis of over 90% of the proximal left internal carotid  artery.   Not applicable.  IVs:  IV Access: Saline Lock  Date of  Insertion:    Is patient presently participating in rehabilitation? Yes  Adjustment to Present Illness: Patient is coping adequately.   Patient is accepting limitations adequately.   Patient's expectations are realistic.   Patient is motivated. To return to PLOF  Activity Tolerance:  Fair.  SPECIAL NEEDS: None.    Current Functional Status  Weight-bearing Status:   No Restrictions  Mobility: 11/19/14 Verbal from PT  Transfers Min Assist  Ambulation Min Assist/unsteadiness/cues required/left neglect    Activities of Daily Living: 11/16/14  Self Care:  Eating: set up, utensils R side  Grooming: set up seated  Bathing: min A  UB Dressing: set up  LB Dressing: min A  Toileting: min A  Cognition: 11/19/14 Alert/Oriented. X4 Follows commands. Impaired information  processing. Problem solving. Attention. Safety awareness. Insight.  Communication: 11/19/14 None noted at this time.    Swallowing:  11/19/14   No swallowing deficits present.    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  Primary: [employer]  Secondary: Not Applicable    Information and Case Discussion:   Rehabilitation risks/benefits were reviewed.  Patient/family/caregiver agrees to/accepts rehabilitation risks/benefits.   Rehab literature/brochure was provided to: Talked with patient's wife and  discussed AR.  Pt and his wife are in agreement with AR 11/19/14 .  Case will be  discussed with Physician/Medical Director.        IRF Admission Approval/Non-Approval  Appropriateness for admission to the Inpatient Rehabilitation Facility:  The  patient's condition is sufficiently stable to allow active participation in  an  intensive interdisciplinary inpatient rehabilitation program. The patient would  benefit from interdisciplinary inpatient rehabilitation provided by a physician,  rehab-focused nursing, and a minimum of two rehab therapies which will provide  specialized care for the following functional deficits:   Cardiac Function  Cognition  Endocrine  Hydration  Leisure Skills  Metabolic Function  Mobility  Nutrition  Pain Management  Psychosocial  Risk of Infection  Safety Risk  Self Care Management  Comorbid conditions present at pre-admission:  The interdisciplinary team will also manage the potential risks and  complications from the following comorbid conditions:    Hypertension.  s/p  Right brain stroke with vision deficits, cognitive deficits, swallow deficits,  mobility deficits and ADL deficits  Recommended services:  The recommended interdisciplinary team will be comprised of the following  services:   Medical Supervision.  24 Hour Rehabilitation Nursing.  Physical Therapy.  Occupational Therapy.  Speech Therapy.  Psychology.  Therapeutic Recreation.  Registered Dietitian.  Patient's expected intensity and frequency of participation in the  interdisciplinary rehabilitation program: is 3 hours of therapy 5 days/week.  Prognosis and level of expected improvement with inpatient rehabilitation stay  is:   Mod I for ambulation with LRAD  Mod I for transfers  Mod I for ADLs    Pt will require assist with transport home on [place]  Estimated date of admission to acute inpatient:   11/19/14  Estimated length of inpatient rehabilitation stay in order to achieve rehab  medical/functional goals:   2+ weeks  Anticipated destination post discharge from inpatient rehabilitation is:   community discharge with assistance.    Anticipate patient will need the following services post discharge from  inpatient rehabilitation: Outpatient therapy.      Physician Approval Status of Admission: Admission Approval:  The patient's  condition is  sufficiently stable to allow active participation in an intensive  interdisciplinary inpatient rehabilitation program. this patient with right PCA  territory brain infarct will benefit from acute inpatient rehabilitation with  PT, OT, speech therapy, psychology/neuropsychology, rehabilitation medicine, and  rehabilitation nursing to maximize his functional recovery and mobility.  SHK    This assessment denotes that on admission to the inpatient rehabilitation  facility, our physician will provide documentation that demonstrates clinical  rehabilitation complications for which the patient is at risk and a specific  plan to avoid those risks. Further, the medical conditions present create  possible adverse conditions  that predictably can be controlled through an  intensive rehabilitation plan of care to be outlined at admission.    Annotated by: Grayling Congress  Annotated: 11/19/2014 4:24:00 PM  BCBS preauthorized patient X7 Acute Rehab starting 11/19/14 Auth #[id number]  Authorized by Everett Graff [phone]  Updates to Verita Lamb at [phone]    Signed by: Grayling Congress, CRRN 11/19/2014 12:20:00 PM    Physician CoSigned By: Antony Contras 11/19/2014 13:27:21

## 2014-11-19 NOTE — PT Progress Note (Signed)
[facility]  [address]  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    Assessment   Gait is more stable today, but still  Unsteady with L neglect. He has difficulty with balance activities even with UE support.  Max cues to scan environment L for safety.  Poor safety awareness, impaired gait.  Vision is still impaired on the L.  Pt admits he was having difficulty reading before the stroke but does not have reading glasses.  States he has difficulty operating his I phone;  suggested he have his wife bring in a magnifying sheet to further assess if it's visual or a processing issue.  Lastly processing is slow; he uses many adjectives that are not appropriate for the subject at hand and sometimes does not answer the question.  L neglect.    Plan   Recommendation  Discharge Recommendation: Acute Rehab  DME Recommended for Discharge:  (TBA)  PT Frequency: 3-4x/wk        Continue plan of care.    Interdisciplinary Communication: RN      Subjective     Patient is agreeable to participation in the therapy session. Nursing clears patient for therapy.  Pain: 0/10    Vitals:   Filed Vitals:    11/18/14 1700 11/18/14 2000 11/18/14 2300 11/19/14 0900   BP: 134/88 136/87 159/80 148/87   Pulse: 70 70 64 63   Temp:  97.6 F (36.4 C) 97.4 F (36.3 C) 98.5 F (36.9 C)   TempSrc:  Oral Oral    Resp: 23 18 18 12    Height:       Weight:       SpO2:           Objective     Precautions/ Contraindications:  Falls, L visual field cut    Patient is in bed with  Telemetry  and Intravenous Access in place.    Therapeutic exercises:    Functional Mobility:    Supine to Sit: CGA  Scooting: ind to EOB  Sit to Supine: NT  Sit to Stand: CGA  Stand to Sit: min a 2/2 impaired vision L  Transfer Training: min a    Gait:   WB status: fwb  Assistive Device: none  Assist Level: min a  Distance: 60'  Pattern: unsteady, L neglect   Stairs/Curbs: NT     Balance Training:  Retrieves an item from the floor with min a for steadying, SLS L ind with UE support, SLS R unable to attain SLS on R even with UE support. Heel ups at window ledge with very wide BOS, occasional UE support.    Educated the patient to role of physical therapy, plan of care, goals  of therapy and safety with mobility and ADLs.    Patient left in a chair with all needs met, equipment intact and call bell within reach. RN notified of session outcome.  Mobility status posted at bedside and within E.M.R.    Goals per Eval/ Re-eval:   Goals  Goal Formulation: With patient  Time for Goal Acheivement: 5 visits  Goals: Select goal  Pt Will Perform Sit to Stand: with stand by assist  Pt Will Transfer Bed/Chair: with stand by assist  Pt Will Ambulate: > 200 feet  Pt Will Go Up / Down Stairs: 1 flight, with contact guard assist, With rail  Other Goal: Pt retrieve an item from the floor independently,;  no LOB    Signature:  Novella Olive, PT  11/19/2014 12:12 PM   Phone: [phone]

## 2014-11-19 NOTE — Discharge Summary (Signed)
[place]/READMIT  SUMMARY/PROGRESS NOTE      Date Time: 11/19/2014 2:29 PM  Patient Name: Blake Sheer, MD  Attending Physician: Arna Medici, MD  Primary Care Physician: Pershing Cox, MD    Date of Admission:   11/15/2014    Date of Discharge:    11/19/2014    Discharge Dx:   Principal Diagnosis: Acute CVA (cerebrovascular accident)  Active Hospital Problems    Diagnosis   . Loss of peripheral visual field, left   . Acute CVA (cerebrovascular accident)   . Essential hypertension   Dyslipidemia   Acute renal insuffiencey- resolved   Left MCA aneurysm   Acute Rt Occipital CVA     Consultations:   Treatment Team: Attending Provider: Arna Medici, MD; Case Manager: Mamie Nick, RN; Consulting Physician: Arna Medici, MD; Physical Therapist: Novella Olive, PT; Registered Nurse: Domenica Reamer, RN; Consulting Physician: Royann Shivers, MD; Occupational Therapist: Ane Payment, OT     Pending Results, Recommendations & Instructions to providers after discharge:   Micro / Labs / Path pending:  Unresulted Labs     None      1. Wound Care Instructions:NA  2. Date of completion for antibiotics or other medications:NA  3. Vascular surgery evaluation for left carotid stenosis    Procedures performed:   Radiology: all results from this admission  Echocardiogram Adult Complete W Color Doppler Waveform    11/17/2014    1. Normal biventricular size and systolic function with an estimated EF of 65-70%. 2. Mild concentric LVH 3. Grade 1 diastolic dysfunction 4. No significant valvular disease 5. No obvious intracardiac masses, thrombi or vegetations were appreciated but cannot be completely excluded.  No prior study for comparison.  Carolynne Edouard, MD  11/17/2014 4:40 PM     Mr Angiogram Head Wo Contrast    11/15/2014      1. 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 2. There is a mild stenosis of the basilar artery and severe attenuation of the intracranial right vertebral artery. 3. Aneurysmal dilatation of  the distal left M1 segment.    Fonnie Mu, MD  11/15/2014 5:35 PM     Mr Angiogram Neck W Wo Contrast    11/15/2014    Localized stenosis of over 90% of the proximal left internal carotid artery.  Fonnie Mu, MD  11/15/2014 5:38 PM     Mri Brain W Wo Contrast    11/15/2014    1. Large area of restricted diffusion throughout the right occipital lobe acute/subacute infarction corresponding to the posterior cerebral artery territory. 2. Atrophy and chronic small vessel ischemic changes.  Fonnie Mu, MD  11/15/2014 5:30 PM     Chest Ap Portable    11/15/2014    No evidence of acute cardiopulmonary disease.  Fonnie Mu, MD  11/15/2014 5:16 PM     US Carotid Duplex Dopp Comp Bilateral    11/16/2014   IMPRESSION : 1. Severe left internal carotid artery stenosis likely greater than 80% with irregular partly calcified atherosclerotic plaque. 2. Mild somewhat irregular at the first carotid plaque right carotid bifurcation stenosis less than 40%. 3. Antegrade flow both vertebral arteries.  Findings discussed with Dr. Roena Malady, MD  11/16/2014 1:06 PM     Surgery: all results from this admission  * No surgery found [facility] Course:   Reason for admission / HPI: See admission H&P for full details. 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 Symptoms started about a week ago with onset of headache and usual symptoms of migraine including aura and decrease in visual field with flashing lights and it improved somewhat after a day but never resolved completely. Today developed decrease in vision again and with persistent Left sided numbness presented for evaluation. Denies any recent fever, cough, SOB, nausea, vomiting or trouble with speech. Had mild confusion intermittently over last 1-2 days.     Hospital Course:   Pleasant 63 year old gentleman who presented with several days history of left-sided numbness along with headache and decrease  in visual field, noted to have an acute Rt occipital CVA.  Patient was seen by speech therapy and was noted to have some word retrieval deficit and recommendation was to continue with speech therapy.  In addition, PT and OT recommended acute rehabilitation due to poor balance and continued left hemianopsia with limitations in function.  Patient is agreeable to this recommendation and once that is arranged, will be transferred to rehabilitation.  Continue aspirin and high-dose statin.  To allow permissive elevation of blood pressure,  Losartan was held and will be resumed at a lower dose now.  Can be titrated back up to home dose of 100 mg when blood pressure allows  Echocardiogram did not show any pertinent abnormalities.  Patient was also seen by cardiology to look for cardiac causes of thromboembolism and no further workup was recommended.  [name] was reluctant to start statin as he has had review his adverse affects with atorvastatin, but after discussion was agreeable to start Crestor at current dose and if he develops significant myalgias, can consider dose reduction.    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   Patient needs follow-up with vascular surgery as an outpatient  Continue aspirin and high-dose statin    In the week before presentation.  Patient was not eating and drinking much and also feels that he was taking more and is aids due to headache and was noted to have acute renal insufficiency on presentation which improved quickly with IV fluids . Monitor renal function .    Patient condition at discharge: stable   DISCHARGE DAY EXAM:  Today:  BP 156/86 mmHg  Pulse 80  Temp(Src) 98.2 F (36.8 C) (Oral)  Resp 28  Ht 1.88 m (6\' 2" )  Wt 108 kg (238 lb 1.6 oz)  BMI 30.56 kg/m2  SpO2 96%  Ranges for the last 24 hours:  Temp:  [97.4 F (36.3 C)-98.5 F (36.9 C)] 98.2 F (36.8 C)  Heart Rate:  [54-81] 80  Resp Rate:  [12-35] 28  BP:  (125-159)/(73-88) 156/86 mmHg  Body mass index is 30.56 kg/(m^2).      Intake/Output Summary (Last 24 hours) at 11/19/14 1429  Last data filed at 11/18/14 2005   Gross per 24 hour   Intake      0 ml   Output    400 ml   Net   -400 ml    General: awake, alert Caucasian male,in no acute distress  Cardiovascular: regular rate and rhythm, no murmurs, rubs or gallops  Lungs: clear to auscultation bilaterally, no additional sounds  Abdomen: soft, non-tender, non-distended; normoactive bowel sounds  Extremities: no edema  Neurological: Alert and oriented X 3, moves all extremities. Reduced sensation and poor coordination on left side, left hemianopsia      Labs/Procedures/Radiology  performed:     Recent Labs  Lab 11/15/14  1608   TROPONIN I <0.01         Recent Labs  Lab 11/15/14  1608   WBC 8.70   HEMOGLOBIN 18.1*   HEMATOCRIT 51.4   PLATELETS 259      Recent Labs  Lab 11/15/14  1608   PT 12.9   PT INR 1.0   PTT 24          Recent Labs  Lab 11/19/14  0352 11/16/14  0356 11/15/14  1609   SODIUM 140 142 141   POTASSIUM 4.0 4.0 4.2   CHLORIDE 108 111 106   CO2 24 23 23    BUN 22 23 27    CREATININE 1.1 1.1 1.5*   EGFR >60.0 >60.0 47.2   GLUCOSE 100 101* 146*   CALCIUM 9.5 9.4 10.6*      Recent Labs  Lab 11/15/14  1609   ALKALINE PHOSPHATASE 83   BILIRUBIN, TOTAL 0.5   PROTEIN, TOTAL 7.9   ALBUMIN 4.9   ALT 49   AST (SGOT) 28        No results for input(s): TSH, FREET3 in the last 168 hours.    Invalid input(s): FREET4     Recent Labs  Lab 11/16/14  0356   CHOLESTEROL 235*   TRIGLYCERIDES 210*   HDL 31*   CALCULATED LDL 162*          Microbiology Results     None          /Readmit Medications:     CURRENT HOME MEDICATION LIST  Current Discharge Medication List      CONTINUE these medications which have NOT CHANGED    Details   amLODIPine (NORVASC) 10 MG tablet Take 10 mg by mouth daily.      losartan (COZAAR) 100 MG tablet Take 100 mg by mouth daily.                ------------------------------------------------------------------------  INPATIENT MEDICATION LIST TO RESUME FOR READMISSION:  Current Facility-Administered Medications   Medication Dose Route Frequency Last Rate Last Dose   . amLODIPine (NORVASC) tablet 10 mg  10 mg Oral Daily   10 mg at 11/19/14 1017   . aspirin tablet 325 mg  325 mg Oral Daily   325 mg at 11/19/14 1017   . enoxaparin (LOVENOX) syringe 40 mg  40 mg Subcutaneous Q24H   40 mg at 11/18/14 2135   . famotidine (PEPCID) tablet 20 mg  20 mg Oral Q12H SCH   20 mg at 11/19/14 1017   . [START ON 11/20/2014] losartan (COZAAR) tablet 50 mg  50 mg Oral Daily       . rosuvastatin (CRESTOR) tablet 20 mg  20 mg Oral QHS   20 mg at 11/18/14 2134          Discharge Instructions:   Disposition: Acute Rehab  Discharge Diet: Cardiac Diet          Patient was instructed to follow up with:   Primary Care Doctor Pershing Cox, MD in  1 week & the following Consultants:  1. Vascular surgery  in 4 weeks    Complete instructions and follow up are in the patient's After Visit Summary    Minutes spent coordinating discharge and reviewing discharge plan: 32 minutes      Signed by: Arna Medici, MD    CC: Pershing Cox, MD Patient Care Team:  Pershing Cox, MD as PCP -  General (Internal Medicine)

## 2014-11-19 NOTE — OT Progress Note (Signed)
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 Impaired dynamic balance during standing tasks. Pt demo inattention to L side, requiring cues to attend to L. Pt able to accurately obtain specific ADL items placed on L w/ increased time, vc's.  Pt will continue to benefit from skilled OT services to maximize safety/I w/ functional tasks.          Plan     OT Plan  Risks/Benefits/POC Discussed with Pt/Family: With patient  Treatment Interventions: ADL retraining;Functional transfer training;Patient/Family training;Equipment eval/education;Neuro muscular reeducation;Compensatory technique education  Discharge Recommendation: Acute Rehab  DME Recommended for Discharge:  (TBD next level of care)  OT Frequency Recommended: 3-4x/wk  Continue plan of care.    Interdisciplinary Communication: discussed w/ RN, PT     Subjective   "I have to remind myself to look to the left side of the phone"   Patient's medical condition is appropriate for Occupational Therapy  intervention at this time.  Patient is agreeable to participation in the therapy session. Nursing clears patient for therapy.  Pain: 0/10      Objective   Precautions and Contraindications:   Falls, L visual field cut     Patient is in bed with  Telemetry and Intravenous Access in place.     Cognition:  Follows commands w/ cues safety, attention to L side. Min  redirection for answering questions,     Self Care:  LB Dressing: CGA  Toileting: CGA    Functional Mobility:  Rolling: independent   Scooting: independent to EOB   Supine to Sit: CGA   Sit to Supine: NT   Sit to Stand: CGA   Transfers: CGA    Treatment Activities: Pt instructed in and performed scanning task to L while standing w/ occasional UE support, obtaining ADL items w/ L hand. Pt accurately obtained 5/5 ADL items w/ vc's for which item to retrieve. Dynamic balance tasks min A to CGA, fair     Observation of Patient:  Vitals:     Filed Vitals:    11/18/14 1700 11/18/14 2000 11/18/14 2300 11/19/14 0900   BP: 134/88 136/87 159/80 148/87   Pulse: 70 70 64 63   Temp:  97.6 F (36.4 C) 97.4 F (36.3 C) 98.5 F (36.9 C)   TempSrc:  Oral Oral    Resp: 23 18 18 12    Height:       Weight:       SpO2:           Education:   Educated the patient to role of occupational therapy, plan of care,  goals of therapy and safety with mobility and ADLs.    Patient left in bedside chair with all needs met, equipment intact and  call bell within reach. RN notified of session outcome.   Mobility and ADL status posted at bedside and within E.M.R.          Goals:  Time For Goal Achievement: 5 visits  ADL Goals  Patient will dress lower body: Modified Independent  Patient will toilet: Supervision  Mobility and Transfer Goals  Pt will transfer bed to toilet: Supervision  Pt will perform bathtub transfer: Supervision  Neuro Re-Ed Goals  Pt will perform dynamic standing balance: Supervision;for 5 minutes;for 10 minutes;to complete standing ADLs safely           Vision Goals  Pt will scan environment/ADL tools: with supervision;to locate items within environment for ADLs;to increase ability to complete ADLs (w/ occasional vc's)            Signature:   Ane Payment, OT  11/19/2014 12:30 PM   [id number]

## 2014-11-19 NOTE — Plan of Care (Signed)
Problem: Moderate/High Fall Risk Score >/=15  Goal: Patient will remain free of falls  Outcome: Progressing  Fall precaution in place. Frequently used items within reach. Hourly round was done. Assisted as needed. Patient sleeping comfortably. Will continue to monitor.     Problem: Day 3- Stroke  Goal: Neurological status is stable or improving  Outcome: Progressing  Patient remain axox4. VSS. Neuro check WNL. Maintain a quiet environment.     Problem: Day 4- Stroke  Goal: Neurological status is stable or improving  Outcome: Progressing  Patient remain axox4. VSS. Neuro check WNL. Maintain a quiet environment.

## 2014-11-19 NOTE — Plan of Care (Signed)
Problem: Health Promotion  Goal: Knowledge - disease process  Extent of understanding conveyed about a specific disease process.   Outcome: Progressing  Pt is alert and oriented x 4, understands the disease process     Problem: Safety  Goal: Patient will be free from injury during hospitalization  Outcome: Progressing  Call bell within reach and instructed patient to call for help.    Problem: Pain  Goal: Patient's pain/discomfort is manageable  Outcome: Progressing    Problem: Day 4- Stroke  Goal: Stable vital signs and fluid balance  Outcome: Progressing  Goal: Patient will maintain Adequate Oxygenation  Outcome: Progressing  Pt is on RA and sats 98%  Goal: Patients risk of aspiration will be minimized  Outcome: Progressing  Goal: Mobility/activity is maintained at optimum level for patient  Outcome: Progressing  Goal: Skin integrity is maintained or improved  Outcome: Progressing  Skin is intact  Goal: Neurovascular Status is Stable or Improving  Outcome: Progressing

## 2014-11-19 NOTE — Progress Notes (Signed)
The patient and care giver's learning abilities have been assessed. Today's individualized plan of care to **lt visual field and lt sided weakness, was discussed with the patient and care giver and agree to it. Patient and care giver demonstrates understanding of disease process, treatment plan, medications and consequences of noncompliance. All questions and concerns were addressed.

## 2014-11-19 NOTE — Progress Notes (Addendum)
Date Time: 11/19/2014 4:22 PM  Patient Name: Blake Sheer, MD  Attending Physician: Arna Medici, MD  Patient Class: Inpatient  Hospital Day: 4            NEUROLOGY PROGRESS NOTE             Assessment/Plan   1. Acute right PCA stroke, involving the occipital lobe, and the thalamic region.  Likely secondary to atherosclerotic disease.  2. Hypertension.  3. Hyperlipidemia.  4. Prior history of migraines with aura  5. Left carotid high-grade stenosis.  6. Basilar stenosis  7. MCA aneurysm--seems fusiform so less likely any intervention will be needed       Continue aspirin and statin.  Will plan CTA while in rehab to get better look at the carotid lesion and aneurysm   Pt wants to avoid CEA as much as possible     Subjective:   Patient Seen and Examined.  No new changes.  No headache, visual disturbance continues     Review of Systems:   No headache, eye, ear nose, throat problems; no coughing or wheezing or shortness of breath, No chest pain or orthopnea, no abdominal pain, nausea or vomiting, No pain in the body or extremities, no psychiatric, neurological, endocrine, hematological or cardiac complaints except as noted above.        Physical Exam:   BP 156/86 mmHg  Pulse 68  Temp(Src) 98.2 F (36.8 C) (Oral)  Resp 19  Ht 1.88 m (6\' 2" )  Wt 108 kg (238 lb 1.6 oz)  BMI 30.56 kg/m2  SpO2 96%    Neuro:  Level of consciousness:  Alert and appropriate .  Speech is fluent  Oriented:  X 3  Facial Movements: symmetric  pupils are equal and reactive .  Mildly disconjugate gaze      Meds:      Scheduled Meds: PRN Meds:        amLODIPine 10 mg Oral Daily   aspirin 325 mg Oral Daily   enoxaparin 40 mg Subcutaneous Q24H   famotidine 20 mg Oral Q12H SCH   [START ON 11/20/2014] losartan 50 mg Oral Daily   rosuvastatin 20 mg Oral QHS       Continuous Infusions:                 Labs:       Recent Labs  Lab 11/19/14  0352 11/16/14  0356 11/15/14  1609   GLUCOSE 100 101* 146*   BUN 22 23 27    CREATININE 1.1 1.1 1.5*    CALCIUM 9.5 9.4 10.6*   SODIUM 140 142 141   POTASSIUM 4.0 4.0 4.2   CHLORIDE 108 111 106   CO2 24 23 23    ALBUMIN  --   --  4.9   AST (SGOT)  --   --  28   ALT  --   --  49   BILIRUBIN, TOTAL  --   --  0.5   ALKALINE PHOSPHATASE  --   --  83       Recent Labs  Lab 11/15/14  1608   WBC 8.70   HEMOGLOBIN 18.1*   HEMATOCRIT 51.4   MCV 89.9   MCH, POC 31.6   MCHC 35.2   PLATELETS 259         No results for input(s): PTT, PT, INR in the last 72 hours.       Radiology Results (24 Hour)     ** No results found  for the last 24 hours. **           All brain imaging (MRI, CT) personally reviewed.    Case discussed with: Cardiology and dr always   Signed by: Cathe Mons, MD  Spectralink: [id number]      Answering Service: [phone]

## 2014-11-19 NOTE — Progress Notes (Signed)
MODIFIED RANKIN SCALE (MRS)    Score Description    0-  No symptoms at all    1-  No significant disability despite symptoms; able to carry out all usual duties and activities    2-  Slight disability; unable to carry out all previous activities, but able to look after own affairs without assistance    3-  Moderate disability; requiring some help, but able to walk without assistance    4-  Moderately severe disability; unable to walk without assistance and unable to attend to own bodily needs without assistance    5-  Severe disability; bedridden, incontinent and requiring constant nursing care and attention    6-  Dead      TOTAL 0    Completed by: Domenica Reamer

## 2014-11-19 NOTE — Progress Notes (Signed)
PROGRESS NOTE    Date Time: 11/19/2014 10:16 AM  Patient Name: Blake Sheer, MD      Assessment:   1. CVA with occlusion of the right PCA and disease of the basilar artery and 80% stenosis of the left carotid artery. Left hemianopsia.   2. No history or ekg findings to suggest ischemic heart disease but risk factors for this are present including cerebrovascular disease, hypertension, hyperlipidemia.   3. No arrhythmias      Normal ekg I have personally reviewed and interpreted the EKG/Rhythm.    Plan:   Eventual lexiscan nuclear scan after the acute phase of the cva has passed.  No further cardiac evaluation otherwise as cerebrovascular disease is thought to be the cause of the presentation.     Subjective:   Less paresthesia on the left side.     Medications:     Current Facility-Administered Medications   Medication Dose Route Frequency   . amLODIPine  10 mg Oral Daily   . aspirin  325 mg Oral Daily   . enoxaparin  40 mg Subcutaneous Q24H   . famotidine  20 mg Oral Q12H SCH   . rosuvastatin  20 mg Oral QHS       Review of Systems:   A comprehensive review of systems was: History obtained from the patient  General ROS: negative  Psychological ROS: negative  Respiratory ROS: no cough, shortness of breath, or wheezing  Cardiovascular ROS: no chest pain or dyspnea on exertion    Physical Exam:     Filed Vitals:    11/19/14 0900   BP: 148/87   Pulse: 63   Temp: 98.5 F (36.9 C)   Resp: 12   SpO2:        Intake and Output Summary (Last 24 hours) at Date Time    Intake/Output Summary (Last 24 hours) at 11/19/14 1016  Last data filed at 11/18/14 2005   Gross per 24 hour   Intake      0 ml   Output    400 ml   Net   -400 ml       General appearance - alert, well appearing, and in no distress  Mental status - alert, oriented to person, place, and time  Mouth - mucous membranes moist, pharynx normal without lesions  Chest - clear to auscultation, no wheezes, rales or rhonchi, symmetric air entry  Heart - normal rate,  regular rhythm, normal S1, S2, no murmurs, rubs, clicks or gallops    Labs:     Results     Procedure Component Value Units Date/Time    Basic Metabolic Panel [[id number]] Collected:  11/19/14 0352    Specimen Information:  Blood Updated:  11/19/14 0445     Glucose 100 mg/dL      BUN 22 mg/dL      Creatinine 1.1 mg/dL      CALCIUM 9.5 mg/dL      Sodium 664 mEq/L      Potassium 4.0 mEq/L      Chloride 108 mEq/L      CO2 24 mEq/L      Anion Gap 8.0     GFR [[id number]] Collected:  11/19/14 0352     EGFR >60.0 Updated:  11/19/14 0445          Recent CBC No results for input(s): RBC, HGB, HCT, MCV, MCH, MCHC, RDW, MPV, LABPLAT in the last 24 hours.    Invalid input(s): WHITEBLOODCE,  NRBCA,  REFLX,  ANRBA    Rads:   Radiological Procedure reviewed.    Signed by: Royann Shivers

## 2014-11-19 NOTE — SLP Progress Note (Signed)
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 Oriented x 4. He continues to present with mild-moderately decreased auditory processing for more complex information and significant word retrieval difficulties, decreased thought organization and specificity when trying to express simple ideas. He also exhibits significantly decreased cognition, including slow and decreased processing for simple-mod complex written information, decreased attention to detail, decreased attention to the left, decreased working memory (but improved over time) and decreased executive functions. 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 Able to use edgedness strategy with mod cues to increase vision/attention to the left. He demonstrates learning over time with crossing items off to help maintain task. Pt requires mod cues to use strategies as well to increase vision/attention to the left for locating items on his table/meal tray. Pt has increased awareness of his deficits compared to Friday, yet still not complete insight into his deficits.    Plan   Continue slp tx toward POC in initial eval. Recommend acute rehab.    Interdisciplinary Communication: Discussed with RN and CM.    Visit Number: 1/5    Subjective   Patient is agreeable to participation in the therapy session. Nursing clears patient for therapy. Patient's medical condition is  appropriate for Speech therapy intervention at this time.    Precautions: Safety   Objective   Tx focused on language, cognition and use of strategies to increase processing, attention to the left, attention to detail and memory.    Signature: Daron Offer, M.S. CCC-SLP

## 2014-11-20 LAB — URINALYSIS WITH MICROSCOPIC
Bilirubin, UA: NEGATIVE
Blood, UA: NEGATIVE
Glucose, UA: NEGATIVE
Ketones UA: NEGATIVE
Leukocyte Esterase, UA: NEGATIVE
Nitrite, UA: NEGATIVE
Protein, UR: NEGATIVE
Specific Gravity UA: 1.017 (ref 1.001–1.035)
Urine pH: 6 (ref 5.0–8.0)
Urobilinogen, UA: NEGATIVE mg/dL

## 2014-11-20 MED ORDER — LOSARTAN POTASSIUM 25 MG PO TABS
50.0000 mg | ORAL_TABLET | Freq: Every day | ORAL | Status: DC
Start: 2014-11-20 — End: 2014-11-27
  Administered 2014-11-21 – 2014-11-27 (×7): 50 mg via ORAL
  Filled 2014-11-20 (×7): qty 2

## 2014-11-20 MED ORDER — AMLODIPINE BESYLATE 5 MG PO TABS
5.0000 mg | ORAL_TABLET | Freq: Every day | ORAL | Status: DC
Start: 2014-11-21 — End: 2014-11-22
  Administered 2014-11-21: 5 mg via ORAL
  Filled 2014-11-20: qty 1

## 2014-11-20 MED ORDER — ENOXAPARIN SODIUM 30 MG/0.3ML SC SOLN
30.00 mg | Freq: Two times a day (BID) | SUBCUTANEOUS | Status: DC
Start: 2014-11-20 — End: 2014-11-27
  Administered 2014-11-20 – 2014-11-27 (×14): 30 mg via SUBCUTANEOUS
  Filled 2014-11-20 (×14): qty 0.3

## 2014-11-20 NOTE — Rehab Progress Note ([employer]) (Signed)
[name]  MRN: [id number]  Account: [id number]  Session Start: 11/20/2014 12:00:00 AM  Session Stop: 11/20/2014 12:00:00 AM    SEVERE SEPSIS SCREEN  INFECTION:  Patient has no indication of infection.  Negative Sepsis Screen.  If you are unable to assess a system's dysfunction because you do not have labs,  or the labs you have are not current (within 24 hours), call physician and  request and order for the lab tests needed.    Signed by: Satira Anis, RN 11/20/2014 8:11:00 AM

## 2014-11-20 NOTE — Rehab Evaluation ([facility]) (Signed)
Blake [name]  MRN: [id number]  Account: [id number]  Session Start: 11/19/2014 12:00:00 AM  Session Stop: 11/19/2014 12:00:00 AM    Rehabilitation Nursing  Inpatient Rehabilitation Admission Assessment    Rehab Diagnosis: CVA (Right Brain)  Demographics:            Age: 63Y            Gender: Male  Past Medical History: Hypertension  History of Infection:   None.  History of Present Illness: Patient admitted through the ER on 11/15/14 with the  following:  1.  New onset of visual disturbance, left face, arm and leg numbness(ongoing  since 1 week). 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 Cannot  rule out migrainous infarction, given prior history of migraines.  2.  Mild to moderate headache.  3.  Hypertension by history  4.  Prior history of migraines with aura.    MRI of the brain revealed a Right Brain CVA and angiogram of the neck revealed:  Localized stenosis of over 90% of the proximal left internal carotid  artery.    Patient has hypertension, left neglect, left visual impairment, hyperlipidemia,  L carotid artery stenosis, moderate cognitive impairments.    Pt was independent prior and would benefit from  Acute Rehab in order to return  to PLOF.    Supportive wife who will be there for patient on discharge.            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 108.18 kilograms. Patient weighed using bed scale.  Height: 74.4 inches. 1.89 meters.  BMI: 30.28 kilogram per meters squared.    ORIENTATION  Instructions and information: Administered to: Patient and Spouse  given  instructions and information on hospital orientation. Orientation was provided  for the following areas: Orientation checklist, Rehab handbook, Bed controls,  Call light, Daily routine, Meal times, Phone and phone numbers, Visiting hours  Primary Language: English  Armband: Correct armband in place.  Valuables/Personal Items:  Patient and family state that patient has valuables  and/or personal items present.  The following valuables and/or personal items are present: clothing,  wife has  all valuable The patient and spouse/significant other received information that  facility is not responsible for any valuables brought in or acquired during  hospital stay.  Understanding of Current Condition: orientedx4  Patient/Caregiver Goals:  Patient's functional goals: to be able to go back my  routine    MEDICATIONS AND ALLERGIES  No B/P or Needle Sticks: Not applicable.  Personal Medications: Patient did not bring a personal supply of medications.  Vaccinations:  Influenza: 11/08/14  IV Access: Patient has IV access.     Type: Saline Lock  IV Gauge: 18 gauge  Location: RAC  Care Provided: Flushed with 10ml normal saline.   Date Inserted:   11/15/14  Dialysis Access: Patient does not have dialysis access.    Medication Allergies: NKA  Food Allergies: NKA  Other Allergies: No Known Allergy    Pain: Patient currently  without complaints of pain.  Pain Reassessment: Pain was not reassessed as no pain was reported.    Elopement Risk Level Assessment Tool  Patient Criteria: Patient is not capable of leaving the unit.  Assessment is not  applicable.      RISK ASSESSMENT FOR FALLS/INJURY    MENTAL STATUS CRITERIA:   10 - Impaired memory.   MENTAL STATUS TOTAL: 10    AGE CRITERIA:   8 - < 39 years old  AGE TOTAL: 0    ELIMINATION CRITERIA:   3 - Toileting with Assistance.   ELIMINATION TOTAL: 3    HISTORY OF FALLS CRITERIA:   2 - Unknown History.  HISTORY OF FALLS TOTAL: 2    MEDICATIONS CRITERIA:   1 High Risk Medication  (*see list below)   MEDICATIONS TOTAL: 1    PHYSICAL MOBILITY CRITERIA:   1 - Weakness/impaired physical mobility.   3 - Decreased balance reaction.   PHYSICAL MOBILITY TOTAL: 4    FALLS RISK ASSESSMENT TOTAL: 20    Patient's Fall Risk: TOTAL SCORE >10: High Risk    Falls Interventions: Clutter removed and clear path to BR.  Call bell, phone, glasses, etc within reach.  Hourly toileting/safety checks between 6am and 10pm, then every 2 hours.  Initiate Fall care plan and outcome.  Yellow "high risk" patient identification in place: wrist band, socks, chart  sticker, door sign.  Pt and family education.  Assistive devices at [facility].  Reoriented PRN.  Family at bedside.  Low bed with mat  Bed alarm   Hourly rounding, call bell in reach      SEVERE SEPSIS SCREEN  INFECTION:  Patient has no indication of infection.  Negative Sepsis Screen.  If you are unable to assess a system's dysfunction because you do not have labs,  or the labs you have are not current (within 24 hours), call physician and  request and order for the lab tests needed.      Restraint Initial: Patient does not have any restraints at this time.    NUTRITION/DIET  IRF-PAI Swallowing Status: Swallowing Status: Regular Food: solids and liquids  swallowed safely without supervision or modified food consistencies.  IRF-PAI Dehydration: Patient does not display clinical signs of dehydration.  Nutrition Screen: Patient's nutrition risk factors include: No risk factors  identified.  Diet: Type: Cardiac.  Food Consistency: Regular.  Liquid Consistency: Thin.    PSYCHOSOCIAL  Psychosocial/Abuse Screen: No evidence of neglect/abuse.  Suicide Risk Screen: Patient does not have a primary or secondary behavioral  health diagnosis or complaint.    Current Alcohol Use:  Patient does not consume alcohol.  Current Tobacco Use:  No, patient does not use tobacco.  Current Drug Use: No, patient does not use recreational drugs.    REVIEW OF SYSTEMS  Eyes:   Right  Eye:  Reactivity: Pupils equal, round and reactive to light and accommodation  (PERRLA).  Sclera Color: Clear   Eye is not draining     Left Eye:  Reactivity: left sided vision deficits and neglect  Sclera Color: Clear   Eye is not draining  Ears: Bilateral Ears: Within normal limits.  Hearing/Communication Device:  Mouth: Gums: Moist.  Tongue: Pink  Teeth: Oral hygiene appears to be adequate.    NEURO  Orientation/Awareness: Alert and Oriented x3.  Behavior: Poor Judgment.  Poor Initiation.  Speech: Clear at times.    CARDIOVASCULAR     Bilateral lower extremities  Nail Bed Color: Pink.   No  edema or redness present.  Homan's Sign:   Negative bilateral lower extremities  Pulses:   Apical Pulse: Regular. Strong. Rate is 67 .   Patient does not have a pacemaker.   Patient does not have a defibrillator.    CARDIOPULMONARY  Lung Sounds:   Upper lobes. Clear.   Lower lobes. Clear.  Type of Respirations: Regular.  Cough: No cough noted.  Respiratory Support: The patient does not require any respiratory support.  Respiratory Equipment: None. 94%RA    INTEGUMENTARY  Skin:  Temperature: Warm  Turgor: Normal for age  Moisture: Dry  Color of skin: Normal for Race/Ethnicity  Capillary Refill: Less than 3 seconds  Wound/Incisions: No wounds or incisions.  Braden Scale for Predicting Pressure Sore Risk: Sensory Perception: No  impairment  Moisture: Rarely moist  Activity: Walks occasionally  Mobility: Slightly limited  Nutrition: Adequate  Friction and Shear: No apparent problem  Braden Score: 20  Level of Risk: No risk (19-23). Will reassess every shift.    GENITOURINARY  Current Bladder Pattern: Continent  Color:  Yellow   Patient denies problems with urination and/or catheter.    Sexuality: The patient denies any concerns regarding sexuality.    GASTROINTESTINAL  Abdomen: Soft.  Bowel Sounds:  Bowel sounds audible in all four quadrants.  Date of Last Bowel Movement:  11/19/14   No Problems/Complaints with Bowel Elimination  Assessed.  The patient has normal bowel activity.  Bowel Movements: The patient has bowel movements daily. Patient uses laxatives.    MUSCULOSKELETAL  Hand Dominance:  Right.  Upper Extremities  Impairments/Prosthesis/Devices: left sided vission impairment, and left sided  neglect  Lower Extremities  Impairments/Prosthesis/Devices: none    FUNCTIONAL MEASURES  Bladder Frequency/Number of Accidents (4 days prior to admission):  Bladder  accidents prior to admission:  0 Patient has not had an accident 4 days prior to  admission.  Bowel Frequency/Number of Accidents (4 days prior to admission):  Bowel  accidents prior to admission:  0 Patient has not had an accident 4 days prior to  admission.    EATING: Eating Score = 4. Patient requires minimal assistance for eating,  requiring steadying for balance only.  Patient requires the following assistive device(s) No assistive devices were  required.    GROOMING: Grooming Score = 4.  Patient requires minimal assistance  for  grooming, requiring steadying for balance only.  Patient requires the following assistive device(s) No assistive devices were  required. left side neglect, needs cueing    BATHING: Patient took sponge bath at sink. Bathing Score = 4.  Patient requires  minimal assistance for bathing, requiring steadying for balance only. No  assistive devices were required.    UPPER BODY DRESSING: Upper Body Dressing Score = 4. Patient requires minimal  assistance for upper body dressing, requiring steadying for balance only.  Patient requires the following assistive device(s):  No assistive devices were  required. left side neglect    LOWER BODY DRESSING: Lower Body Dressing Score = 4.  Patient requires minimal  assistance for lower body dressing, requiring steadying for balance only.  Patient requires the following assistive device(s): No assistive devices were  required.    TOILETING: Patient requires minimal assistance for adjusting clothing before  using a toilet,  commode, bedpan, or urinal. Patient requires minimal assistance  for hygiene. Patient requires minimal assistance for adjusting clothing after  using a toilet, commode, bedpan, or urinal. Patient performs 0 -  24% of  toileting tasks.  Toileting Score =  1, Total Assistance.  Patient requires the following assistive device(s):  No assistive devices were  required.    BLADDER MANAGEMENT - LEVEL OF ASSIST: Bladder Score = 3. Patient performs 50-74%  of tasks and requires moderate assistance for bladder management. Helper  provides some assist to position the urinal, holds it in place, or empties the  urinal.    BLADDER ACCIDENTS THIS SHIFT:  0 . Patient has not had an accident but used a  urinal this assessment.    BOWEL MANAGEMENT - LEVEL OF ASSIST: Bowel Score = 6.  Patient is modified  independent for bowel management.  Patient did not have bowel movement.  Medication/intervention was provided.    BOWEL ACCIDENTS THIS SHIFT: 0 . Patient has not had an accident, but used a  stool softener.    TRANSFERS BED/CHAIR/WHEELCHAIR: Bed/chair/wheelchair Transfer Score = 4.  Patient performs 75% or more of effort and minimal assistance (little/incidental  help/lifting of one limb/steadying) for transferring to and from the  bed/chair/wheelchair, requiring: Contact guard.  Patient requires the following assistive device(s): No assistive devices were  required.  Walker.    TRANSFER TOILET: Toilet Transfer Score = 4.  Patient performs 75% or more of  effort and minimal assistance (little/incidental help/steadying) for  transferring to and from the toilet/commode, requiring: Contact guard.  Patient requires the following assistive device(s):  No assistive devices were  required.    SOCIAL INTERACTION: Social Interaction Score = 3, Moderate Direction. Patient  interacts appropriately 50-74% of the time.  Patient requires moderate/some  direction for the following behavior(s): No assistive devices were required,  Withdrawn,  Cooperation, Appropriateness    IRFPAI GOALS    Eating: 7  Bladder: 7  Bowel: 6    FUNCTION  Functional Screen: Patient's functional risk limitations include: Decreased use  of one or both lower extremities.  Requires assistance for ambulation.   loss of left visual field Therapy consults were ordered.  Discharge Planning Screen: Potential discharge planning issues include:  Decreased mobility.    Social Services/Case Management consult was ordered.    Interdisciplinary Educational Needs and Learning Preferences:       Learning Preference: The patient's preferred learning method is:  Explanation.  The patient's preferred learning method is: Demonstration.       Barriers to Learning: Cognitive limitations.  Visual deficits.       Learning Needs: Communication/cognition, Safety, Swallowing    Education Provided: Precautions. Plan of care. Safety issues and interventions.  Impaired vision. Supervision requirements. Medication. Name and dosage.       Audience: Patient and significant other.       Mode: Explanation.       Response: Applied knowledge.    ASSESSMENT  Summary of Rehab Nursing-specific Deficits:   Safety: Risk for fall   Self-care deficits (Feeding, Toileting, Bathing and Hygiene, Medication  management)  Rehab Potential: Able to participate in an intensive inpatient interdisciplinary  rehabilitation program, Good family/social support  Barriers to Progress/Discharge: Reduced insight    Long Term Goals:   Time frame to achieve long term goal(s): 2 weeks from  11/19/14       1. Patient will demo increase self awareness on weak/ affected side in  order  to prevent neglect.  Short Term Goals:  Time frame to achieve short term goal(s): 7 days from  11/19/14       1. Pt will initiate use of call ligh at least 50-75% during the day before  getting OOB.  2. Pt will direct safe OOB transferes with Minimum Assistant       3. Family will be able to participate with ADLs, and toileting assistance  at least during  the day.      PLAN  Nursing-specific Interventions:   self awareness on effected side in order to prevent neglect    TEAM CARE PLAN  Identified problems from team documentation:      Identified problems from this assessment:     Safety Risk : self awareness of effected side in order to prevent neglect    Discipline:  Nursing    Please review Integrated Patient View Care Plan Flowsheet for Team identified  Problems, Interventions, and Goals.    Signed by: Carlynn Herald, RN 11/19/2014 11:45:00 PM

## 2014-11-20 NOTE — Assessment & Plan Note (Signed)
[name]  MRN: [id number]  Account: [id number]  Session Start: 11/20/2014 12:00:00 AM  Session Stop: 11/20/2014 12:00:00 AM    Nutrition  Inpatient Rehabilitation Initial Assessment - Limited    Rehab Diagnosis: CVA (Right Brain)  Demographics:            Age: 65Y            Gender: Male  Primary Language: English    Past Medical History: Hypertension  History of Present Illness: Patient admitted through the ER on 11/15/14 with the  following:  1.  New onset of visual disturbance, left face, arm and leg numbness(ongoing  since 1 week). 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 Cannot  rule out migrainous infarction, given prior history of migraines.  2.  Mild to moderate headache.  3.  Hypertension by history  4.  Prior history of migraines with aura.    MRI of the brain revealed a Right Brain CVA and angiogram of the neck revealed:  Localized stenosis of over 90% of the proximal left internal carotid  artery.    Patient has hypertension, left neglect, left visual impairment, hyperlipidemia,  L carotid artery stenosis, moderate cognitive impairments.    Pt was independent prior and would benefit from  Acute Rehab in order to return  to PLOF.    Supportive wife who will be there for patient on discharge.            Date of Onset: 11/15/14            Date of Admission: 11/19/2014 6:50:00 PM    Medications and Allergies: Significant rehabilitation considerations:   NKDA  Rehabilitation Precautions/Restrictions:   Fall Precaution, Swallow Precauiton    SUBJECTIVE  Patient Reports: No octopus. The bed is not good to sleep on. No, I don't want  the informationon the diet.  Social History: Unknown  Patient/Caregiver Goals:  Patient's functional goals: to be able to go back to  my routine    OBJECTIVE  Labs: Pertinent labs wnl.  Weight: 237.6 pounds. 108 kilograms.  Height:  74.4 inches. 1.89 meters.  BMI: 30.23 kilogram per meters squared.  Weight Group:  Obese Class I    Admission Weight:  238  Usual Weight: 238 pounds.    Food Allergies/Intolerances: No known food allergies.  Religious/Cultural Food Practices: None  Current Medical/Nutrition Therapy: Diet: Cardiac   diet with thin liquids.    % of Meals Consumed: >60 %  Feeding Modality and Dentition: Oral, teeth intact  Current Problems/GI Symptoms: No Current GI Problems Noted.  Wounds/Incisions:  Absent    ASSESSMENT  Overall Assessment of Nutritional Status:  Current Nutrition Intake: Adequate.  Current Nutrition Status: Adequate.  Nutritional Risk Assessment:  No nutritional risk present.  Nutrition Diagnosis: No nutrition problems currently.    Interventions Provided/Recommendations:   Assessment Completed.   Monitor Progress.    PLAN  Recommendations for Follow-up: F/U 12/11/13    Care Plan  Identified problems from team documentation:  Problem: Safety Risk and Restraint  Safety: Primary Team Goal: self awareness of effected side in order to prevent  neglect/    Identified problems from this assessment:     No problems identified at this time.    Please review Integrated Patient View Care Plan Flowsheet for Team identified  Problems, Interventions, and Goals.    Signed by: Lurline Hare, M.S., RD 11/20/2014 1:38:00 PM

## 2014-11-20 NOTE — Rehab Evaluation ([facility]) (Addendum)
Corrected 11/21/2014 8:07:09 AM    NAME: Blake [name]  MRN: [id number]  Account: [id number]  Session Start: 11/20/2014 8:00:00 AM  Session Stop: 11/20/2014 9:00:00 AM    Occupational Therapy  Inpatient Rehabilitation Evaluation    Rehab Diagnosis: CVA (Right Brain)  Demographics:            Age: 89Y            Gender: Male  Primary Language: English    Past Medical History: Hypertension  History of Present Illness: Patient admitted through the ER on 11/15/14 with the  following:  1.  New onset of visual disturbance, left face, arm and leg numbness(ongoing  since 1 week). 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 Cannot  rule out migrainous infarction, given prior history of migraines.  2.  Mild to moderate headache.  3.  Hypertension by history  4.  Prior history of migraines with aura.    MRI of the brain revealed a Right Brain CVA and angiogram of the neck revealed:  Localized stenosis of over 90% of the proximal left internal carotid  artery.    Patient has hypertension, left neglect, left visual impairment, hyperlipidemia,  L carotid artery stenosis, moderate cognitive impairments.    Pt was independent prior and would benefit from  Acute Rehab in order to return  to PLOF.    Supportive wife who will be there for patient on discharge.   Date of Onset: 11/15/14   Date of Admission: 11/19/2014 6:50:00 PM  Premorbid Functional Level: Pt and wife report full independence prior to  admission. Pt works full time as a Insurance account manager.  Designer, television/film set History: Pt has a wife and two adult children, a son who  lives nearby and a daughter in [place]. He has four grandchildren. 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  Home Environment: Pt lives in a two-story home with his wife. Per wife, he has 4  STE and approximately 17 stairs to get to the second floor. His main bedroom and  bathroom are on the second floor but he is able to relocate to a first floor  bedroom and bathroom. His wife is  available for 24 hour care.    Medications and Allergies: Significant rehabilitation considerations:   NKDA  Rehabilitation Precautions/Restrictions:   Fall Precaution, Swallow Precaution    SUBJECTIVE  Patient Report: "I am frustrated with this terrible hospital bed and how bright  the lights are."  Patient/Caregiver Goals:  Patient and Spouse/Significant Other's functional  goals: To get back to my routine, spend time with my family and work.  Pain: Patient currently without complaints of pain.  Pain Reassessment: Pain was not reassessed as no pain was reported.    OBJECTIVE  General Observation: Pt sitting unsupported in bed upon initation of session.  Pt's wife present. Pt appeared uncomfortable; reported that bed made it hard to  sleep. Alert and oriented x4. Noted mild impulsivity, getting out of bed and  walking to sofa despite directions. Impaired orientation to space, attempting to  walk out bedroom door instead of in the bathroom.  Pt and wife requesting to  have privacy when completing bathing and dressing tasks.  Pt verbose and  tangential requiring redirection.  Understanding of Current Condition: Patient has fair understanding of functional  status. Patient is able to state that he is impaired by left visual field cut  and has trouble with obstacle negotiation, but has decreased insight into  supervision that is required, that  he needs more training to adapt to vision  issues.  Vital Signs:                        Current Value                Previous Value  Vitals  BP Systolic          -                            126/65  Pulse                -                            58  Respirations         -                            18    Cognition: Pt verbose and tangential throughout session requiring redirection  for attention to tasks. Required cues for safety approx50% of the time 2/2 impaired  awarenes and problem solving deficits. Able to restate safety risks however  required cues for planning route  to/from bathroom. Good short term and long term  memory.  Vision:  L field cut and L inattention; further assessment required  Perception: L inattention but otherwise grossly intact    Upper Extremity Status   Tone: No observed deficits during functional tasks but further assessment  required   Range of Motion: WFL, no observed deficits during self-care   Fine Motor: To be formally assessed; able to perform self-care tasks with  compensatory strategies for L inattention   Other:    Strength/Motor Control: Grossly intact upon functional assessment. Noted  weakness greater on L side of trunk than R. Further assessment required.  Sensation: Hot/cold intact during functional tasks. Further assessment required.      Interventions: None provided today.    Interdisciplinary Educational Needs and Learning Preferences:       Learning Preference: The patient's preferred learning method is:  Explanation.  The patient's preferred learning method is: Demonstration.  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       Barriers to Learning: Acuity of illness.  Visual deficits.       Learning Needs: Bowel/bladder programs/training, Communication/cognition,  Equipment, Functional activities/mobility, Precautions, Rehabilitation  techniques and procedures, Safety, Stroke    Education Provided: Plan of care. Rehab techniques and procedures. Neuro re-ed  Safety issues and interventions. Supervision requirements. Fall protocol.  Activities of daily living. Bed mobility. Functional transfers. Stroke. Stroke  recovery, Emotional adjustments/depression, Caregiver stress,  Supervision/assistance requirements       Audience: Patient and significant other.       Mode: Explanation.  Demonstration.       Response: Applied knowledge.  Verbalized understanding.  Needs practice.  Needs reinforcement.    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 He presents with  mild to moderate cognitive deficits as  well as impaired balance and coordination. He has L visual field and attention  deficits that also may impact functional independence. Recommend skilled IP OT  to increase I with self-care and mobility as well as to provide caregiver  education prior to discharge home with wife.  Rehab Potential:  Able to participate in an intensive inpatient interdisciplinary  rehabilitation program, Good family/social support, Good premorbid functional  status, Good premorbid medical status, Living in the community premorbidly,  Motivated  Barriers to Progress/Discharge: Reduced insight,  Education for pt and caregiver      Functional Measures    EATING: Eating Score = 7. Patient is completely independent for eating.  There  are no activity limitations.    GROOMING: Grooming Score = 5. Patient is supervision/set-up for grooming,  requiring: Stand by assistance.  Verbal cuing, prompting, or instructing.  Initial preparation.  Patient requires the following assistive device(s) No assistive devices were  required. Supervision to perform while standing at sink 2/2 L side neglect and  balance deficits    BATHING: Patient bathed in shower. Patient requires no physical assistance for  washing, rinsing, and drying the right arm. Patient requires moderate assistance  for washing, rinsing, or drying the left arm. Patient requires no physical  assistance for washing, rinsing, and drying the chest. Patient requires no  physical assistance for washing, rinsing, and drying the abdomen. Patient  requires no physical assistance for washing, rinsing, and drying the perineal  area. Patient requires minimal assistance for washing, rinsing, or drying the  buttocks. Patient requires no physical assistance for washing, rinsing, and  drying the right upper leg. Patient requires no physical assistance for washing,  rinsing, and drying the left upper leg. Patient requires no physical assistance  for washing, rinsing,  and drying the right lower leg, including the foot.  Patient requires no physical assistance for washing, rinsing, and drying the  left lower leg, including the foot. Patient performs 80 % of bathing tasks.  Bathing Score = 4, Minimal Assistance.  Patient requires the following assistive device(s): Hand held shower.   Seated on shower bench Completed bathing seated on shower bench with wife's  assist per pt request. L field cut and inattention limited independence;  required steady assist to cleanse buttocks while standing 2/2 balance deficits    UPPER BODY DRESSING: Upper Body Dressing Score = 5. Patient is  supervision/set-up for upper body dressing, requiring: Stand by assistance.  Gathering/setting out clothes.  Patient requires the following assistive device(s):  No assistive devices were  required. Donned pullover shirt with set up    LOWER BODY DRESSING: Lower Body Dressing Score = 4.  Patient requires minimal  assistance for lower body dressing, requiring steadying for balance only.  Patient requires the following assistive device(s): No assistive devices were  required. Steady assist to pull garments over waist while standing 2/2 balance  deficits. Donned socks/shoes at bed level.    TOILETING: Toileting Score = 5.  Patient is supervision/set-up for toileting,  requiring: Stand by assistance.  Patient requires the following assistive device(s):  Grab bar.    BLADDER MANAGEMENT - LEVEL OF ASSIST: Bladder Score = 7. Patient is completely  independent for bladder management. There are no activity limitations.    BLADDER ACCIDENTS THIS SHIFT:  0 . Patient has not had an accident this  assessment and did not require medications or devices.    TRANSFERS BED/CHAIR/WHEELCHAIR: Bed/chair/wheelchair Transfer Score = 5.  Patient is supervision/set-up for transferring to and from the  bed/chair/wheelchair, requiring: Stand by assistance.  Verbal cuing, prompting, or instructing.  Set up (positioning equipment, lock  brakes and/or adjust foot rest).  Patient requires the following assistive device(s): Bed rails. Completed stand  pivot w/c<>hospital bed with use of bedrail for stability and close stand by  assist 2/2 balance deficits    TRANSFER TOILET: Toilet Transfer Score = 5.  Patient is supervision/set-up for  transferring to and from the toilet/commode, requiring: Stand by assistance.  Verbal cuing, prompting, or instructing.  Patient requires the following assistive device(s):  Grab bars. Completed gait  level tfr to/from standard toilet with use of grab bar for stability and supv  for safety 2/2 balance and coordination deficits    TRANSFER SHOWER: Shower Transfer Score = 4. Patient performs 75% or more of  effort and minimal assistance (little/incidental help/lifting of one  limb/steadying) for transferring to and from the shower, requiring: Steadying.  Grab bars.   Shower bench Completed gait level tfr to/from shower bench with steady assist  for safety on wet surface and use of grab bars for stability 2/2 balance  deficits    COMPREHENSION: Auditory comprehension is the usual mode. Comprehension Score =  7, Independent.  Patient comprehends complex/abstract information in their  primary language.  Patient is completely independent for auditory comprehension.   There are no activity limitations.    EXPRESSION: Vocal expression is the usual mode. Patient does not express  complex/abstract information in their primary language without a helper.  Expression Score = 5, Stand By Prompting. Patient expresses basic daily needs or  ideas without prompting.  Patient has the following assistive device(s) or limitations: No assistive  devices were required    SOCIAL INTERACTION: Social Interaction Score = 4, Minimal Direction. Patient  interacts appropriately 75-90% of the time. Patient requires minimal/occasional  direction for the following behavior(s): Agitation, Cooperation, Social  behaviors Pt easily frustrated with  hospitalization, reports agitation that  needs redirection    PROBLEM SOLVING: Patient does not make appropriate decisions in order to solve  complex problems without assistance from a helper. Problem Solving Score = 3,  Moderate Direction. Patient makes appropriate decisions in order to solve  routine problems 50-74% of the time. Patient requires moderate/some direction  for the following behavior(s): Switching from one task to another, Sequencing  tasks, Inhibiting impulsivity, Recognizing risks, Self correcting performance  Difficulty problem solving safety risks for ADLs ie; balance deficits increasing  fall risks    MEMORY: Memory Score = 6, Modified Independence.  Patient is modified  independent for memory, having only mild difficulty and using self-initiated or  environmental cues to remember. The patient's memory is mildly impacted by Using  environmental cues, Using memory aids    IRFPAI Goals    Eating: 7  Grooming: 6  Bathing; 5  Upper Body Dressing: 6  Lower Body Dressing: 6  Toileting: 6  Mode of Bathing: B  Tub/Shower: 5  Toilet Transfers: 6    Short Term Goals:  Not applicable.  Long Term Goals:   Time frame to achieve long term goal(s):   7-10 days       1. Pt will complete gait level transfers to/from toilet with supervision  and use of LRD DME for safety with minimal cues for attention to L side       2. Pt will initate scanning of environment towards L side with minimal cues  in prep for household navigation.       3. Pt will complete self-care routine of upper/lower body dressing and  grooming tasks with mod I for safety considerations in prep for attending  appointments.       4. Pt will complete bathing tasks and transfer to/from shower with LRD DME,  minimal cues, and supervision in prep for  visiting with grandchildren.    Risks/Benefits of Rehabilitation Discussed with Patient/Caregiver: Yes.  Recommendations/Goals for Rehabilitation Discussed with Patient/Caregiver:  Yes.    PLAN  Occupational Therapy Plan: Occupational Therapy is recommended.  Recommended Frequency/Duration/Intensity: 60-120 min/day,   5-6 days/wk, 7-10  days from eval  Activities Contributing Toward Care Plan: Neuro re-ed, visual/perceptual  activities, ther ex, ther act, ADL retraining, AD/AE training, DME recs, [place]  planning, caregiver training/education    Team Care Plan  Please review Integrated Patient View Care Plan Flowsheet for Team identified  Problems, Interventions, and Goals.    Identified problems from team documentation:  Problem: Safety Risk and Restraint  Safety: Primary Team Goal: self awareness of effected side in order to prevent  neglect/    Identified problems from this assessment:     Self Care Management : Pt will complete morning routine with supervision  provided by wife.    Discipline:  Occupational Therapy    3 Hour Rule Minutes: 60 minutes of OT treatment this session count towards  intensity and duration of therapy requirement. Patient was seen for the full  scheduled time of OT treatment this session.  Therapy Mode Minutes: Individual: 60 minutes.    Signed by: Murlean Caller, OTR/L 11/20/2014 9:00:00 AM

## 2014-11-20 NOTE — Progress Notes (Signed)
[facility] Physiatry Attending Face to Face Progress Note   Functional Status/Update:   I reviewed patient's therapy notes to assess functional status and ongoing need for therapies. Of note, increased time for word finding and processing while communicating throughout session. Impaired dynamic balance during standing tasks. Pt demo inattention to L side, requiring cues to attend to L.    Subjective:   No pain. No headache. No constipation. No chest pain. No shortness of breath. Did not sleep well last night due to bed. Otherwise reports no new concerns.    Objective:   Filed Vitals:    11/19/14 2000 11/19/14 2116 11/20/14 0536   BP:   126/65   Pulse:   58   Temp:   97.9 F (36.6 C)   Resp:   18   Height: 1.834 m (6' 0.2") 1.88 m (6\' 2" )    Weight: 107.956 kg (238 lb) 107.956 kg (238 lb)    SpO2:   94%       Physical Examination:   Appears well.  In no acute distress. Overweight.   Conjunctivae non-injected. EOMI.   Symmetric facies. Hearing grossly intact. Moist mucous membranes.   +S1S2 Heart rate and rhythm are regular. No murmurs/rubs/gallops. No significant lower limb edema.   Lungs are clear to auscultation bilaterally. No wheezes, rales, or rhonchi. Good respiratory effort.   Soft. Non-tender. Normoactive bowel sounds.   Alert Pleasant and cooperative     New Labs:  Results     Procedure Component Value Units Date/Time    Urine culture [[id number]] Collected:  11/20/14 0015    Specimen Information:  Urine / Urine, Clean Catch Updated:  11/20/14 0456    MRSA culture [[id number]] Collected:  11/19/14 1956    Specimen Information:  Body Fluid / Nares and Throat Updated:  11/20/14 0047    Urinalysis with microscopic [[id number]] Collected:  11/20/14 0015    Specimen Information:  Urine Updated:  11/20/14 0041     Urine Type Clean Catch      Color, UA Yellow      Clarity, UA Clear      Specific Gravity UA 1.017      Urine pH 6.0      Leukocyte Esterase, UA Negative      Nitrite, UA Negative      Protein, UR Negative       Glucose, UA Negative      Ketones UA Negative      Urobilinogen, UA Negative mg/dL      Bilirubin, UA Negative      Blood, UA Negative      RBC, UA 0 - 5 /hpf      WBC, UA 0 - 5 /hpf           Current medications:   Scheduled Meds:  Current Facility-Administered Medications   Medication Dose Route Frequency   . amLODIPine  10 mg Oral Daily   . aspirin  324 mg Oral Daily   . docusate sodium  100 mg Oral BID   . losartan  50 mg Oral Daily   . rosuvastatin  20 mg Oral QHS     PRN Meds:.    Assessment: 63 y.o. male with h/o HLD, HTN now s/p R PCA CVA.  Plan:   REHAB: Continue comprehensive and intensive inpatient rehab program, including:   Physical therapy 60-120 min daily, 5-6 times per week, Occupational therapy  60-120 min daily, 5-6 times per week, Speech therapy  60-120 min daily, 5-6 times  per week, Case management and Rehabilitation nursing    Will continue to address the following impairments and issues:  Mobility, ADLs, Aphasia, Cognitive impairments, Impaired ROM, Impaired endurance, Adherence to precautions, Caregiver training, Medication management, Adjustment to disability, Community support and resources, Impaired coordination, Impaired balance and Coping strategies    R PCA stroke: rehab above for ADL dysfunction and retraining. May consider rehab psychology for support. Permissive HTN, goal <180 until 12/18, then 130-150. Neuro to plan on doing CTA for better visualization of carotid stenosis.  VTE prophy: full dose aspirin  HTN: decrease to 5 amlodipine, [place] losartan, goals above  HLD: started on rosuvastatin this hospitalization  Dispo: [facility] 1 week, team today  F/U: with neurology, outpatient  I have discussed the plan of care with the resident physician and examined patient independently.  I have reviewed the note above and concur with findings and plan.  Additional comments are:  Doing well in tx.  Discussed with the pt re: status and tx.   Team conf held this pm.  Status and tx plans were discussed  at length.  Report completed.

## 2014-11-20 NOTE — Rehab Evaluation ([facility]) (Addendum)
Corrected 11/20/2014 3:08:10 PM    NAME: Blake [name]  MRN: [id number]  Account: [id number]  Session Start: 11/20/2014 1:00:00 PM  Session Stop: 11/20/2014 2:00:00 PM    Physical Therapy  Inpatient Rehabilitation Eval    Rehab Diagnosis: CVA (Right Brain)  Demographics:            Age: 42Y            Gender: Male  Primary Language: English  Past Medical History: Hypertension  History of Present Illness: Patient admitted through the ER on 11/15/14 with the  following:  1.  New onset of visual disturbance, left face, arm and leg numbness(ongoing  since 1 week). 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 Cannot  rule out migrainous infarction, given prior history of migraines.  2.  Mild to moderate headache.  3.  Hypertension by history  4.  Prior history of migraines with aura.    MRI of the brain revealed a Right Brain CVA and angiogram of the neck revealed:  Localized stenosis of over 90% of the proximal left internal carotid  artery.    Patient has hypertension, left neglect, left visual impairment, hyperlipidemia,  L carotid artery stenosis, moderate cognitive impairments.    Pt was independent prior and would benefit from  Acute Rehab in order to return  to PLOF.    Supportive wife who will be there for patient on discharge.   Date of Onset: 11/15/14   Date of Admission: 11/19/2014 6:50:00 PM  Premorbid Functional Level: Patient worked full time as a Insurance account manager, was  independent with all ADLS, mobility, and was driving.  Social/Education History: Married, lives with wife. Full time neurologist. Very  well-educate medically.  Home Environment: Patient lives with wife in two level home, 5 STE with 1 HR,  and 17 steps up to second floor where main bedroom and bathroom are located with  1 HR. Per wife, patient can stay on first floor in guest bedroom if needed. Wife  will be present to assist patient as much as needed.    Medications and Allergies: Significant rehabilitation considerations:    NKDA  Rehabilitation Precautions/Restrictions:   Fall, left visual field cut/left neglect with issues of obstacle negotiation    SUBJECTIVE  Patient/Caregiver Goals:  Patient's functional goals: "I don't think there are  any physical issues". Patient wants to go back to his routine.  Pain: Patient has "brain freeze" feeling in nasal cavity, no pain elsewhere  Pain Reassessment: Discomfort in right nasal cavity    OBJECTIVE  General Observations: Patient has difficulty looking to the left side without  being cued, often looks away from therapist or down at the floor when speaking  even when therapist is within his field of vision.  Understanding of Current Condition: Patient has fair understanding of functional  status. Patient is able to state that he is impaired by left visual field cut  and has trouble with obstacle negotiation, but has decreased insight into  supervision that is required, that he needs more training to adapt to vision  issues.  Communication/Cognition: Patient is alert and oriented to person, place, time,  and situation. Patient is very educated and works as a Insurance account manager so is able to  hide his cognitive deficits well by carrying on a conversation functionally. He  is very tangential and verbose at times, and has mild word-finding difficulties.  Patient is slightly impulsive and requires cueing for visual scanning and  orientation to left side. Please see speech therapy evaluation  for specific  cognitive/communication issues.  Sensation: Patient denies numbness/tingling on right side. Patient states there  is "disagreeable numbness" and tingling on his upper left arm, left side of  trunk, intermittently on upper and lower leg. "It feels like I have an ankle  brace around my ankle, there is pressure there".  Range of Motion: All range of motion WNL  Strength/Motor Control: All strength B UE and LE WNL; normal tone in all  extremities. Minimal coordination deficits with finger to nose test on L UE  and  heel to shin test on L LE.  Balance: Patient has good static standing balance, fair-good dynamic standing  balance. Requires increased assistance with balance in presence of obstacles and  distracting environment.  Berg Balance Scale:  1. Sitting to Standing: 4 - able to stand without using hands and stabilize  independently  2. Arises: 4 - able to stand safely 2 minutes  3. Sitting Unsupported: 4- did not perform based on ability to stand 2 minutes  unsupported.  4. Standing to Sitting: 4 - sits safely with minimal use of hands  5. Transfers: 4 - able to transfer safely with minor use of hands  6. Standing Unsupported with Eyes Closed: 4 - able to stand 10 seconds safely  7. Standing with Feet Together: 4 - able to place feet together independently  and stand 1 minute safely  8. Reaching Forward in Standing: 4 - can reach forward confidently > 25cm/10in  9. Picking up Objects from Standing: 4 - able to pick up slipper safely and  easily  10. Turning to Look 4 - looks behind from both sides and weight shifts well  11. Turning 360 Degrees: 4 - able to turn 360 degrees safely in 4 seconds or  less  12. Alternating Foot on Step: 4 - able to stand independently/safely and complete  8 steps in 20 seconds  13. Standing Unsupported One Foot in Front: 3 - able to place foot ahead of  other independently and hold 30 seconds  14. Standing On One Leg: 3 - able to lift leg independently and hold 5-10  seconds  Total Score: 54   Patient has good balance based on Berg Balance Scale and is at a low risk for  falls. Patient would benefit from assessment on the Dynamic Gait Index to assess  higher level dynamic balance deficits.  Dorien Chihuahua, Wood-Dauphinee S, Rozycki JI, Gayton D: Measuring balance in the  elderly: Preliminary development of an instrument. Physiotherapy [place],  [id number], 1989.      Therapeutic/Functional Mobility:            Bed Mobility: Supervision for all bed mobility            Transfers: Patient requires  supervision for sit to stand and stand  pivot transfers without AD; requires cueing to visually scan completely to right  and left side to compensate for left field cut.            Locomotion/Wheelchair: Requires supervision for wheelchair locomotion  due to obstacle negotiation issues due to visual field cut and left neglect, up  to 200'.            Locomotion/Gait: Patient ambulates without assistive device with close  supervision required due to left visual field cut and left neglect. Patient came  close to running into door frame, stools in his path of ambulation. Patient  lacks insight regarding his need for supervision due to difficulty with  navigating.  Stairs: Patient ascended/descended 12 steps with 1 HR, reciprocal  pattern with supervision; requires verbal cueing for visual scanning.    Interventions:    Interdisciplinary Educational Needs and Learning Preferences:       Learning Preference: The patient's preferred learning method is:  Explanation.  The patient's preferred learning method is: Demonstration.       Barriers to Learning: Visual deficits.       Learning Needs: Functional activities/mobility, Plan of care, Precautions,  Rehabilitation techniques and procedures, Safety, Stroke    Education Provided: Precautions. Plan of care. Safety issues and interventions.  Fall protocol. Impaired vision. Impulsivity. Bed mobility. Functional transfers.  Safety. Gait. Stair/curb/environmental barrier negotiation.       Audience: Patient.       Mode: Explanation.  Demonstration.       Response: Verbalized understanding.  Demonstrated skill.  Needs practice.  Needs reinforcement.    ASSESSMENT  Summary of Deficits and Prognosis: Mr. Creasy presents to inpatient  rehabilitation with impaired high level balance and coordination, left neglect  and left visual field cuts. The patient is largely requiring CGA-supervision for  obstacle negotiation as his largest barrier is visual issues. Patient also  has  difficulty with ambulation in distracting environments and multi-tasking. He  will benefit from treatment in this setting to address these deficits and allow  for adaptation to visual field cuts to allow for safe discharge to home.  Rehab Potential: Able to participate in an intensive inpatient interdisciplinary  rehabilitation program, Good family/social support, Good premorbid functional  status, Living in the community premorbidly  Barriers to Progress/Discharge:  Visual deficits    Functional Measures      TRANSFERS BED/CHAIR/WHEELCHAIR: Bed/chair/wheelchair Transfer Score = 5.  Patient is supervision/set-up for transferring to and from the  bed/chair/wheelchair, requiring: Stand by assistance.  Verbal cuing, prompting, or instructing.  Set up (positioning equipment, lock brakes and/or adjust foot rest).  Patient requires the following assistive device(s): No assistive devices were  required.    LOCOMOTION WHEELCHAIR:   Wheelchair locomotion was observed using a manual wheelchair. Wheelchair  Distance Scale = 3.  Distance traveled in wheelchair is greater than 150 feet.  Wheelchair Score = 5.  Patient is supervision or set up only for propelling  wheelchair, requiring: Stand by assistance. Patient was able to propel a  distance of 200 feet in a wheelchair. No assistive devices required.    LOCOMOTION WALK:   Walk Distance Scale = 3.  Distance walked is greater than 150 feet. Walk Score  = 5.  Patient requires supervision or set up for walking. Patient walked a  distance of  150 feet. No assistive devices were required.  Patient requires  supervision for ambulation within room, and needs closer supervision with  distracting environments and when there are several obstacles around.    LOCOMOTION STAIRS: Stairs Score = 5.  Patient requires supervision or set up for  negotiating stairs. 12 stairs.  Patient requires the following assistive device(s):  Handrail(s).    COMPREHENSION: Auditory comprehension is the  usual mode. Comprehension Score =  6, Modified Independence.  Patient comprehends complex/abstract information in  their primary language with only mild difficulty.    EXPRESSION: Vocal expression is the usual mode. Patient does not express  complex/abstract information in their primary language without a helper.  Expression Score = 4, Minimal Prompting. Patient expresses basic daily needs or  ideas 75-90% of the time.  Patient requires minimal/occasional prompting.  Patient has the following assistive  device(s) or limitations:  Word-finding  difficulties, extra time required; tangential speech    SOCIAL INTERACTION: Social Interaction Score = 6, Modified Independent.  Patient  is modified independent for social interaction, requiring: Requires additional  time.    PROBLEM SOLVING: Patient does not make appropriate decisions in order to solve  complex problems without assistance from a helper. Problem Solving Score = 4,  Minimal Direction. Patient makes appropriate decisions in order to solve routine  problems 75-90% of the time. Patient requires minimal/occasional direction for  the following behavior(s): Attending to tasks, Attending to self/environment,  Processing information, Sequencing tasks, Inhibiting impulsivity, Recognizing  presence or extent of disability, Planning tasks    PAI Expected Mode of Locomotion at Discharge: The expected mode of most  frequently used locomotion, at discharge, is expected to be walking.    IRFPAI Goals    Bed, Chair, Wheelchair Transfers: 7  Mode of Locomotion: W  Walk/Wheelchair: 5  Stairs: 6    Short Term Goals:  Not applicable.  Long Term Goals:   Time frame to achieve long term goal(s): 7-10 days       1. Patient will be independent for bed mobility and transfers to facilitate  safe return to home environment.       2. Patient will be independent to ambulate short distances within house  (approx50') to facilitate safe home mobility.       3. Patient will require supervision  for ambulation longer distances (up to  1000') in distracting and crowded environments with minimal cueing required for  obstacle negotiation to facilitate safe return to community.       4.  Patient will ascend/descend 12 steps with 1 HR with supervision to  facilitate safely reaching bedroom/bathroom upstairs.       5. Patient will require cues no more than 25% of the time to scan his  environment to compensate for left field cut to facilitate safe return to home  and community.       4.  Patient will ascend/descend 12 steps with 1 HR with modified  independence to facilitate safely reaching bedroom/bathroom upstairs.    Risks/Benefits of Rehabilitation Discussed with Patient/Caregiver: Yes.  Recommendations/Goals for Rehabilitation Discussed with Patient/Caregiver: Yes.    PLAN  Physical Therapy Plan: Physical Therapy is recommended.  Recommended Frequency/Duration/Intensity: 60-120 minutes/day, for 7-10 days from  initial evaluation  Activities Contributing Toward Care Plan: Therapeutic exercise, therapeutic  activities, visual adaptation techniques, balance training, coordination  training, gait and stair negotiation training, obstacle negotiation, and family  training and discharge planning all with an interdisciplinary approach to care    Team Care Plan  Please review Integrated Patient View Care Plan Flowsheet for Team identified  Problems, Interventions, and Goals    Identified problems from team documentation:  Problem: Impaired Bladder Management    Problem: Impaired Bowel Management    Problem: Impaired Pain Management    Problem: Safety Risk and Restraint  Safety: Primary Team Goal: self awareness of effected side in order to prevent  neglect/    Identified problems from this assessment:     Mobility : Patient will ambulate household distances (50') independently and  ascend/descend 12 steps with 1 HR with modified independence to facilitate safe  return to home environment.    Discipline:  Physical  Therapy    3 Hour Rule Minutes: 60 minutes of PT treatment this session count towards  intensity and duration of therapy requirement. Patient was seen for the full  scheduled  time of PT treatment this session.  Therapy Mode Minutes: Individual: 60 minutes.    Signed by: Malachy Mood, PT 11/20/2014 2:00:00 PM

## 2014-11-20 NOTE — Rehab Progress Note ([facility]) (Signed)
NAMEKAIL FRALEY  MRN: [id number]  Account: [id number]  Session Start: 11/20/2014 12:00:00 AM  Session Stop: 11/20/2014 12:00:00 AM    SEVERE SEPSIS SCREEN  INFECTION:  Patient has no indication of infection.  Negative Sepsis Screen.  If you are unable to assess a system's dysfunction because you do not have labs,  or the labs you have are not current (within 24 hours), call physician and  request and order for the lab tests needed.    Signed by: Sherlyn Lick, RN 11/20/2014 9:00:00 PM

## 2014-11-20 NOTE — Rehab Progress Note ([facility]) (Signed)
Blake [name]  MRN: [id number]  Account: [id number]  Session Start: 11/20/2014 12:00:00 AM  Session Stop: 11/20/2014 12:00:00 AM    Rehabilitation Nursing  Inpatient Rehabilitation Shift Assessment    Rehab Diagnosis: CVA (Right Brain)  Demographics:            Age: 63Y            Gender: Male  Primary Language: English    Date of Onset:  11/15/14  Date of Admission: 11/19/2014 6:50:00 PM    Rehabilitation Precautions Restrictions:   Fall Precaution, Swallow Precauiton    Patient Report: "I hate this bed."  Pain: Patient currently without complaints of pain.  Pain Reassessment: Pain was not reassessed as no pain was reported.  Patient/Caregiver Goals:  to be able to go back to my routine    NEURO  Orientation/Awareness: Alert and Oriented x2.  Alert and Oriented x3.   Pt is forgetful at times.  Speech: Clear.  Behavior: Perseverates.   Pt perseverates to staff about  how uncomfortable the is bed. The bed are the  same all over the house.    MEDICATIONS  IV Access: No IV access.  Dialysis Access: Patient does not have dialysis access.      Elopement Risk Level Assessment Tool  Patient Criteria: Patient is not capable of leaving the unit.  Assessment is not  applicable.      RISK ASSESSMENT FOR FALLS/INJURY    MENTAL STATUS CRITERIA:   10 - Impaired memory.   MENTAL STATUS TOTAL: 10    AGE CRITERIA:   63 - < 15 years old  AGE TOTAL: 0    ELIMINATION CRITERIA:   3 - Toileting with Assistance.   ELIMINATION TOTAL: 3    HISTORY OF FALLS CRITERIA:   2 - Unknown History.  HISTORY OF FALLS TOTAL: 2    MEDICATIONS CRITERIA:   2 or more High Risk Medications (*see list below)   MEDICATIONS TOTAL: 2    PHYSICAL MOBILITY CRITERIA:   3 - Decreased balance reaction.   1 - Weakness/impaired physical mobility.   PHYSICAL MOBILITY TOTAL: 4    FALLS RISK ASSESSMENT TOTAL: 21    Patient's Fall Risk: TOTAL SCORE >10: High Risk    Falls Interventions: Clutter removed and clear path to BR.  Call bell, phone, glasses, etc within  reach.  Hourly toileting/safety checks between 6am and 10pm, then every 2 hours.  Initiate Fall care plan and outcome.  Yellow "high risk" patient identification in place: wrist band, socks, chart  sticker, door sign.  Pt and family education.  Assistive devices at [facility].    NUTRITION  Diet:  Type: Cardiac.  Food Consistency: Regular.  Liquid Consistency: Thin.      CARDIOVASCULAR     Bilateral lower extremities  Nail Bed Color: Pink.   No edema or redness present.  Pulses:   Apical Pulse: Regular. Strong. Rate is 67 .   Patient does not have a pacemaker.   Patient does not have a defibrillator.    CARDIOPULMONARY  Lung Sounds:   Upper lobes. Clear.   Lower lobes. Clear.  Type of Respirations: Regular.  Cough: No cough noted.  Respiratory Support: The patient does not require any respiratory support.  Respiratory Equipment: None. 93%    INTEGUMENTARY  Skin:  Temperature: Warm  Turgor: Elastic  Moisture: Dry  Color of skin: Normal for Race/Ethnicity  Capillary Refill: Less than 3 seconds  Wounds/Incisions: No wounds or incisions.  Braden Scale  for Predicting Pressure Sore Risk: Sensory Perception: No  impairment  Moisture: Rarely moist  Activity: Walks occasionally  Mobility: Slightly limited  Nutrition: Adequate  Friction and Shear: No apparent problem  Braden Score: 20  Level of Risk: No risk (19-23). Will reassess every shift.    GASTROINTESTINAL  Abdomen: Soft. Nontender.  Bowel Sounds:  Active bowel sounds audible in all four quadrants.  Date of Last Bowel Movement:  11/19/14   No Problems/Complaints with Bowel Elimination Assessed.    GENITOURINARY  Current Bladder Pattern: Continent  Color:  Yellow   Patient denies problems with urination and/or catheter.    MUSCULOSKELETAL  Upper Body: left sided vission impairment, and left sided neglect  Lower Body: none    Functional Measures    EATING: Eating Score = 5. Patient is supervision/set-up for eating, requiring:  Opening containers.  Patient requires the following  assistive device(s) No assistive devices were  required.    TOILETING: Toileting Score = 4.  Patient requires minimal assistance for  toileting, such as steadying for balance while cleansing or adjusting clothes.  Patient requires the following assistive device(s):  Grab bar.    BLADDER MANAGEMENT - LEVEL OF ASSIST: Bladder Score = 7. Patient is completely  independent for bladder management. There are no activity limitations.    BLADDER ACCIDENTS THIS SHIFT:  0 . Patient has not had an accident this  assessment and did not require medications or devices.    BOWEL MANAGEMENT - LEVEL OF ASSIST: Bowel Score = 6. Patient is modified  independent for bowel management requiring: Stool softeners    BOWEL ACCIDENTS THIS SHIFT: 0 . Patient has not had an accident, but used a  stool softener.    TRANSFERS BED/CHAIR/WHEELCHAIR: Bed/chair/wheelchair Transfer Score = 4.  Patient performs 75% or more of effort and minimal assistance (little/incidental  help/lifting of one limb/steadying) for transferring to and from the  bed/chair/wheelchair, requiring: Contact guard.  Patient requires the following assistive device(s): Bed rails.  Grab bars.  Seating system of wheelchair.    TRANSFER TOILET: Toilet Transfer Score = 4.  Patient performs 75% or more of  effort and minimal assistance (little/incidental help/steadying) for  transferring to and from the toilet/commode, requiring: Contact guard.  Patient requires the following assistive device(s):  Grab bars.    Education Provided:    Education Provided: Safety issues and interventions. Fall protocol. Medication.  Name and dosage. Administration. Purpose. Side Effects.       Audience: Patient.       Mode: Explanation.       Response: Needs reinforcement.    Discharge: Patient is not being discharged at this time.    Long Term Goals:  1. Patient will demo increase self awareness on weak/ affected  side in order  to prevent neglect.  2 weeks from 11/19/14  Short Term Goals:  1. Pt will  initiate use of call ligh at least 50-75% during  the day before getting OOB.   2. Pt will direct safe OOB transferes with Minimum Assistant   3. Family will be able to participate with ADLs, and toileting assistance at  least during the day.  7 days from 11/19/14    PROGRESS TOWARD GOALS: Pt has not attempted to get OOB unassisted. Pt is without  c/o pain or discomfort. Wife at bedside with pt. Will continue to monitor.    PLAN: Nursing Specific Interventions  self awareness on effected side in order to prevent neglect  Continue with the current  Nursing Plan of Care.    TEAM CARE PLAN  Identified problems from team documentation:  Problem: Impaired Bladder Management    Problem: Impaired Bowel Management    Problem: Impaired Cognition  Cognition: Primary Team Goal: Patient will use strategies to compensate for lL  neglect in simple reading/writing/info management with no cues 75% of the  time./Active    Problem: Impaired Communication  Communication: Primary Team Goal: Pt will monitor for clarity, relevancy and  organization to convey mildy complex information (e.g. give directions to his  home) with occasional reminder (< 2X in 10 minute conversation)/Active    Problem: Impaired Mobility  Mobility: Primary Team Goal: Patient will ambulate household distances (50')  independently and ascend/descend 12 steps with 1 HR with modified independence  to facilitate safe return to home environment./Active    Problem: Impaired Pain Management    Problem: Impaired Self-care Mgmt/ADL/IADL  Self Care: Primary Team Goal: Pt will complete morning routine with supervision  provided by wife./    Problem: Safety Risk and Restraint  Safety: Primary Team Goal: self awareness of effected side in order to prevent  neglect/    Add/Update Problems from this assessment:  No updates at this time.    Please review Integrated Patient View Care Plan Flowsheet for Team identified  Problems, Interventions, and Goals.    Signed by: Satira Anis,  RN 11/20/2014 4:21:00 PM

## 2014-11-20 NOTE — Rehab Evaluation ([facility]) (Addendum)
Corrected 11/20/2014 4:10:26 PM    NAME: Blake [name]  MRN: [id number]  Account: [id number]  Session Start: 11/20/2014 9:00:00 AM  Session Stop: 11/20/2014 10:00:00 AM    Speech Language Pathology  Inpatient Rehabilitation Language Cognitive-Dysphagia Evaluation    Rehab Diagnosis: CVA (Right Brain)  Demographics:            Age: 52Y            Gender: Male  Primary Language: English    Past Medical History: Hypertension  History of Present Illness: Patient admitted through the ER on 11/15/14 with the  following:  1.  New onset of visual disturbance, left face, arm and leg numbness(ongoing  since 1 week). 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 Cannot  rule out migrainous infarction, given prior history of migraines.  2.  Mild to moderate headache.  3.  Hypertension by history  4.  Prior history of migraines with aura.    MRI of the brain revealed a Right Brain CVA and angiogram of the neck revealed:  Localized stenosis of over 90% of the proximal left internal carotid  artery.    Patient has hypertension, left neglect, left visual impairment, hyperlipidemia,  L carotid artery stenosis, moderate cognitive impairments.    Pt was independent prior and would benefit from  Acute Rehab in order to return  to PLOF.    Supportive wife who will be there for patient on discharge.   Date of Onset: 11/15/14   Date of Admission: 11/19/2014 6:50:00 PM  Premorbid Functional Level: Prior Level of Function: Pt independent w/ ADLs,  IADLs, functional mobility, drives. 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  Wife will support and assist on discharge.  Home Environment: Pre-hospital Living Environment: Social  History:  Lives with spouse in a house.  Entry Steps: 5 Rails: yes Inside steps: flight Rails: yes  Equipment at home: none    Medications and Allergies: Significant rehabilitation considerations:   See EPIC  Rehabilitation Precautions/Restrictions:   Fall Precaution    SUBJECTIVE  Patient/Caregiver Goals:  Patient's functional goals: "I guess I will need to  find some way to minimize this area of inattention"  Understanding of Current Condition: Pt developing increasingly sophisticated  awareness with each clinical encounter and experience.  Pain: Patient currently without complaints of pain.      OBJECTIVE  Vision and Hearing: Vision: refer to OT for assessment of field cut  Hearing: WNL    Oral Motor Exam: unremarkable  Swallow:   Clinical Assessment: No clinical evidence or reported dysphagia.   Recommended Diet: Regular diet. Thin liquids.      Language/Cognitive Tests Administered:  Cognitive-Linguistic Quick Test   Attention: 179  Mild   Memory  164  WNL   Executive Function  25  Mild   Language 36 WNL   Visuospatial Skills  71 MIld      Communication:  Mild-moderate pragmatic language deficits marked by decreased  organization, cohesion and relevancy of content in unstructured tasks. Eye  contact to L hemispace with cues.    Cognition: Mild  R Hemisphere Dysfunction with neglect and visual field  disturbances significantly impacting spatial orientation and  management of all  graphic/printed information resulting in an overall moderate impact upon simple  functions. Pt is attempting to use compensations and does so fairly well from a  visual perspective but is less successful with his neglect.  Results from formal  testing may not accurately capture the degree of deficit due to pt's high  premorbid level of function and ability to compensate.      Functional Measures      COMPREHENSION: Auditory comprehension is the usual mode. Comprehension Score =  7, Independent.  Patient comprehends complex/abstract  information in their  primary language.  Patient is completely independent for auditory comprehension.   There are no activity limitations.    EXPRESSION: Vocal expression is the usual mode. Patient does not express  complex/abstract information in their primary language without a helper.  Expression Score = 5, Stand By Prompting. Patient expresses basic daily needs or  ideas without prompting.  Patient has the following assistive device(s) or limitations:  Pragmatic  deficits impact comprehensibility of the intended message at complex levels.    SOCIAL INTERACTION: Social Interaction Score = 4, Minimal Direction. Patient  interacts appropriately 75-90% of the time. Patient requires minimal/occasional  direction for the following behavior(s): Adjustment/coping Pt reports anxiety  with cognitive activities requiring support and clinical management.    PROBLEM SOLVING: Patient does not make appropriate decisions in order to solve  complex problems without assistance from a helper. Problem Solving Score = 3,  Moderate Direction. Patient makes appropriate decisions in order to solve  routine problems 50-74% of the time. Patient requires moderate/some direction  for the following behavior(s): Attending to tasks, Attending to  self/environment, Switching from one task to another, Processing information,  Inhibiting impulsivity, Recognizing presence or extent of disability, Making  decisions, Self correcting performance, Planning tasks    MEMORY: Memory Score = 6, Modified Independence.  Patient is modified  independent for memory, having only mild difficulty and using self-initiated or  environmental cues to remember. The patient's memory is mildly impacted by Using  environmental cues    Interventions: None provided today.    Interdisciplinary Educational Needs and Learning Preferences:  Discussed results  of testing and proposed POC    ASSESSMENT  Pt presents with mild-moderate right hemispheric dysfunction with a  significant  neglect and field cut complicating the clinical picture. The degree of impact on  function will become clearer with therapeutic intervention as patient's medical  background and high premorbid intelligence result in adaptations and strategies  during formal testing that might not be utilized in less predictable scenarios.  The patient's insight is improving quickly with each opportunity to experience  an impact and he is already attempting some strategies for dealing with his  field cut. He is less successful in compensating for the neglect but with  practice he should acquire some useful techniques for simple reading/writing/  information management. Other cognitive areas in executive function, attention  and perception are also mildly impaired but take an additional secondary hit  from the visual deficit and neglect which impede the gathering of relevant  information required to accurately problem solve.       From a communication standpoint, the patient does best with constraints and  structure. Pragmatic deficits in organization, word choice and clarity of  message are pronounced in spontaneous conversation. Pt has a tendency toward  over elaboration which confounds even the simpliest message.    Prognosis for meeting goals is good given recency of onset, high premorbid  level  of function, motivation and excellent memory for new learning. Negative  indicators include nature of deficit with neglect's high negative impact upon  all functional measures.    IRFPAI Goals    Mode of Expression: V  Expression: 6  Social Interaction: 5  Problem Solving: 4    Short Term Goals: Time frame to achieve short term goal(s):  1 week from today  11/20/14       1. Pt will utilize edging strategies to compensate for neglect in simple  reading, writing and information management (e.g. filling out a form) with min  cues.       2. Pt will monitor for clarity, relevancy and organization to convey mildy  complex  information (e.g. give directions to his home) with mod cues.  Long Term Goals:  Time frame to achieve long term goal(s):  2 weeks from today  11/21/11       1. 1. Pt will utilize edging strategies to compensate for neglect in simple  reading, writing and information management (e.g. filling out a form) with no  cues 75% of the time       2. 2. Pt will monitor for clarity, relevancy and organization to convey  mildy complex information (e.g. give directions to his home) with occasional  reminder (< 2X in 10 minute conversation)    Risks/Benefits of Rehabilitation Discussed with Patient/Caregiver: Yes.  Recommendations/Goals for Rehabilitation Discussed with Patient/Caregiver: Yes.    PLAN  Speech Pathology Plan: Speech Language Pathology is recommended to address:  Recommended Frequency/Duration/Intensity: 1;1 or group 60-120 minutes daily for  5-6 days week for 1-2 weeks duration then d/c to home  Activities Contributing Toward Care Plan: Strategy training with opportunity for  practice along a hierarchy of difficulty, patient and family education,  collaboration with team including neuropsych.    TEAM CARE PLAN  Please review Integrated Patient View Care Plan Flowsheet for Team identified  Problems, Interventions, and Goals.    Identified problems from team documentation:  Problem: Impaired Bladder Management    Problem: Impaired Bowel Management    Problem: Impaired Mobility  Mobility: Primary Team Goal: Patient will ambulate household distances (50')  independently and ascend/descend 12 steps with 1 HR with modified independence  to facilitate safe return to home environment./Active    Problem: Impaired Pain Management    Problem: Impaired Self-care Mgmt/ADL/IADL  Self Care: Primary Team Goal: Pt will complete morning routine with supervision  provided by wife./    Problem: Safety Risk and Restraint  Safety: Primary Team Goal: self awareness of effected side in order to prevent  neglect/    Identified problems  from this assessment:     Communication : Pt will monitor for clarity, relevancy and organization to  convey mildy complex information (e.g. give directions to his home) with  occasional reminder (< 2X in 10 minute conversation)    Discipline:  Speech/Language Pathology    3 Hour Rule Minutes: 60 minutes of SLP treatment this session count towards  intensity and duration of therapy requirement. Patient was seen for the full  scheduled time of SLP treatment this session.  Therapy Mode Minutes: Individual: 60 minutes.    * Original [place] Cognitive Scale co-authored by Bonney Aid, Ph.D.,  Margot Chimes, M.A., Lanier Ensign, M.A., [facility], [place].  Revised 10/21/73 by Margot Chimes, M.A., and Vernie Shanks, O.T.R.    Signed by: Bonner Puna, M.A., CCC-SLP 11/20/2014 10:00:00 AM

## 2014-11-20 NOTE — H&P (Signed)
Patient Type: I     ATTENDING PHYSICIAN: Candie Echevaria, MD     DATE OF ADMISSION:  November 19, 2014.     This document also represents post-admission physician assessment.  I agree  with the appropriateness of this admission for intensive inpatient  rehabilitation with close medical followup.  The history and physical  examination was completed on November 19, 2014, by 11:15 p.m.     CHIEF COMPLAINT:  Poor balance.     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       HISTORY OF PRESENT ILLNESS:  The patient is a 63 year old right-hand dominant gentleman who was admitted  to [facility] on November 15, 2014, with left visual field  cut as well as left-sided sensory loss and poor coordination.  MRI of the  brain revealed a right-sided occipital stroke.  A few days prior to  admission, he started experiencing headaches and left-sided numbness and  decreased visual field.  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  He has been started on 325 mg of aspirin a day  as well as 20 mg of Crestor.     PAST MEDICAL HISTORY:  Only remarkable for hypertension and hyperlipidemia.     ALLERGIES:  No known drug allergies.     SOCIAL HISTORY:  He worked full-time as a Insurance account manager.  He does not use tobacco, alcohol, or  illegal drugs.     FUNCTIONAL STATUS PRIOR TO HIS ILLNESS:  The patient was independent in all areas of mobility and activities of  daily living.     FUNCTIONAL STATUS AT TIME OF ADMISSION TO REHABILITATION:    He required contact guard to minimal assistance for transfers and  ambulation.     FAMILY HISTORY:  Reviewed and is noncontributory.     REVIEW OF SYSTEMS:  As above.  He denies any nausea, diarrhea, constipation, dysuria,  frequency, urgency, CP, SOB, cough, headaches, auditory disturbances,  symptoms of major depression or anxiety, fever, chills, significant  recent  change in weight or appetite.     PHYSICAL EXAMINATION:  VITAL SIGNS:  Blood pressure was 164/77.  Pulse was 81 beats per minute and  regular.  Respiration 15 per minute.  Temperature was 98.5 degrees F.   Height is 188 cm and weight is 107 kilograms.  BMI is 32.2.  HEENT:  Pupils are equal and reactive to light.  Extraocular muscles are  intact.  Ears, nose, throat, and mouth are clear.  NECK:  Supple, with no jugular venous distention or thyromegaly.  LUNGS:  Clear to auscultation without wheezing, rales, or rhonchi.  HEART:  Regular rhythm without any murmurs or ectopy.  ABDOMEN:  Soft and nontender.  No mass or hepatomegaly was detected.  Bowel  sounds were normal.  EXTREMITIES:  Without edema or cyanosis.  NEUROLOGIC:  The patient is alert and oriented x3.  Left visual field cut  was detected.  Decreased sensation, and coordination was noted on the left  side.  FUNCTIONAL STATUS:  As above.  SKIN:  Intact.     IMPRESSION:  1.  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  2.  Atherosclerotic cerebrovascular disease.  3.  Hypertension.  4.  Hyperlipidemia.  5.  Left visual field cut.     PLAN OF  CARE:  As per orders.  I have reviewed the patient's medical records and imaging  studies.  I have discussed with him the details of his status, prognosis,  precautions, and treatment plans at length.  All questions were answered to  his satisfaction.  The case was also discussed with the patient's  registered nurse, resident physician and neurologist.     POST-ADMISSION ASSESSMENT:  The patient presents as represented by preadmission screening.  He is an  appropriate candidate for intensive inpatient rehabilitation with close  medical followup.  In light of the decline of functional status, he  requires 60 to 120 minutes a day, 5 to 6 days a week of physical and  occupational therapy to maximize mobility, safety, balance,  and  participation in activities of daily living with adaptive and assistive  device evaluation.  No setting of lesser intensity is appropriate for safe,  effective, and efficient care of this gentleman at this time.  He requires  daily visits by physical medicine and rehabilitation physician, who will  coordinate the care while evaluating and managing comorbidities.  The blood  pressure needs to be kept between 130 to 150 systolic.  Daily neurological  evaluation is indicated.  The patient and family education/support will be  emphasized.  He also requires 24 hours a day of inpatient rehabilitation  nursing for safe in-room mobility as well as reinforcing the teachings of  other disciplines.     Discharge planning will be with the help of internal case manager in  collaboration with the rest of the team.     Prognosis for medical rehabilitation is good.     DISPOSITION:  Home with supervision.     ESTIMATED LENGTH OF STAY:  1 week.           D:  11/20/2014 18:13 PM by Dr. Eusebio Friendly, MD [phone])  T:  11/20/2014 18:48 PM by       Everlean Cherry: [id number]) (Doc ID: [id number])

## 2014-11-21 MED ORDER — OXYCODONE HCL 5 MG PO TABS
5.00 mg | ORAL_TABLET | ORAL | Status: DC | PRN
Start: 2014-11-21 — End: 2014-11-27

## 2014-11-21 MED ORDER — FAMOTIDINE 20 MG PO TABS
20.0000 mg | ORAL_TABLET | Freq: Two times a day (BID) | ORAL | Status: DC
Start: 2014-11-21 — End: 2014-11-27
  Administered 2014-11-21 – 2014-11-27 (×12): 20 mg via ORAL
  Filled 2014-11-21 (×12): qty 1

## 2014-11-21 MED ORDER — MECLIZINE HCL 12.5 MG PO TABS
25.00 mg | ORAL_TABLET | Freq: Four times a day (QID) | ORAL | Status: DC | PRN
Start: 2014-11-21 — End: 2014-11-27

## 2014-11-21 NOTE — Rehab Progress Note ([facility]) (Signed)
NAMEMICHELLE VANHISE  MRN: [id number]  Account: [id number]  Session Start: 11/21/2014 12:00:00 AM  Session Stop: 11/21/2014 12:00:00 AM    SEVERE SEPSIS SCREEN  INFECTION:  Patient has no indication of infection.  Negative Sepsis Screen.  If you are unable to assess a system's dysfunction because you do not have labs,  or the labs you have are not current (within 24 hours), call physician and  request and order for the lab tests needed.    Signed by: Ranae Pila, RN 11/21/2014 7:45:00 AM

## 2014-11-21 NOTE — Rehab Progress Note ([facility]) (Signed)
Blake [name]  MRN: [id number]  Account: [id number]  Session Start: 11/20/2014 12:00:00 AM  Session Stop: 11/20/2014 12:00:00 AM    Rehabilitation Nursing  Inpatient Rehabilitation Shift Assessment    Rehab Diagnosis: CVA (Right Brain)  Demographics:            Age: 63Y            Gender: Male  Primary Language: English    Date of Onset:  11/15/14  Date of Admission: 11/19/2014 6:50:00 PM    Rehabilitation Precautions Restrictions:   Fall Precaution, Swallow Precauiton    Patient Report: no pain  Pain: Patient currently without complaints of pain.  Pain Reassessment: Pain was not reassessed as no pain was reported.  Patient/Caregiver Goals:  to be able to go back to my routine    NEURO  Orientation/Awareness: Alert and Oriented x3.  Speech: Clear.  Behavior: Cooperative.  Impulsive.    MEDICATIONS  IV Access: No IV access.  Dialysis Access: Patient does not have dialysis access.      Elopement Risk Level Assessment Tool  Patient Criteria: Patient is not capable of leaving the unit.  Assessment is not  applicable.      RISK ASSESSMENT FOR FALLS/INJURY    MENTAL STATUS CRITERIA:   10 - Impaired memory.   MENTAL STATUS TOTAL: 10    AGE CRITERIA:   49 - < 16 years old  AGE TOTAL: 0    ELIMINATION CRITERIA:   3 - Toileting with Assistance.   ELIMINATION TOTAL: 3    HISTORY OF FALLS CRITERIA:   2 - Unknown History.  HISTORY OF FALLS TOTAL: 2    MEDICATIONS CRITERIA:   2 or more High Risk Medications (*see list below)   MEDICATIONS TOTAL: 2    PHYSICAL MOBILITY CRITERIA:   1 - Weakness/impaired physical mobility.   3 - Decreased balance reaction.   PHYSICAL MOBILITY TOTAL: 4    FALLS RISK ASSESSMENT TOTAL: 21    Patient's Fall Risk: TOTAL SCORE >10: High Risk    Falls Interventions: Clutter removed and clear path to BR.  Call bell, phone, glasses, etc within reach.  Hourly toileting/safety checks between 6am and 10pm, then every 2 hours.    NUTRITION  Diet:  Type: Cardiac.  Food Consistency: Regular.  Liquid  Consistency: Thin.      CARDIOVASCULAR     Bilateral lower extremities  Nail Bed Color: Pink.   No edema or redness present.  Pulses:   Apical Pulse: Regular. Strong. Rate is 58 .   Patient does not have a pacemaker.   Patient does not have a defibrillator.    CARDIOPULMONARY  Lung Sounds:   Upper lobes. Clear.   Lower lobes. Clear.  Type of Respirations: Regular.  Cough: No cough noted.  Respiratory Support: The patient does not require any respiratory support.  Respiratory Equipment: None. 98% room air    INTEGUMENTARY  Skin:  Temperature: Warm  Turgor: Normal for age  Moisture: Dry  Color of skin: Normal for Race/Ethnicity  Capillary Refill: Less than 3 seconds  Wounds/Incisions: No wounds or incisions.  Braden Scale for Predicting Pressure Sore Risk: Sensory Perception: No  impairment  Moisture: Rarely moist  Activity: Walks occasionally  Mobility: Slightly limited  Nutrition: Adequate  Friction and Shear: Potential problem  Braden Score: 19  Level of Risk: No risk (19-23). Will reassess every shift.    GASTROINTESTINAL  Abdomen: Soft. Nontender.  Bowel Sounds:  Active bowel sounds audible in all  four quadrants.  Date of Last Bowel Movement:  11/19/13   No Problems/Complaints with Bowel Elimination Assessed.    GENITOURINARY  Current Bladder Pattern: Continent  Color:  Yellow   Patient denies problems with urination and/or catheter.    MUSCULOSKELETAL  Upper Body: left sided vission impairment, and left sided neglect  Lower Body: none    Functional Measures      BLADDER MANAGEMENT - LEVEL OF ASSIST: Bladder Score = 7. Patient is completely  independent for bladder management. There are no activity limitations.    BLADDER ACCIDENTS THIS SHIFT:  0 . Patient has not had an accident this  assessment and did not require medications or devices.    BOWEL MANAGEMENT - LEVEL OF ASSIST: Bowel Score = 6.  Patient is modified  independent for bowel management.  Patient did not have bowel movement.  Medication/intervention  was provided.    BOWEL ACCIDENTS THIS SHIFT: 0 . Patient has not had an accident, but used a  stool softener.    Education Provided:    Education Provided: Safety issues and interventions. Fall protocol. Fall  protocol. Safety issues and interventions. Medication. Name and dosage. Purpose.         Audience: Patient.       Mode: Explanation.       Response: Verbalized understanding.    Discharge: Patient is not being discharged at this time.    Long Term Goals:  1. Patient will demo increase self awareness on weak/ affected  side in order  to prevent neglect.  2 weeks from 11/19/14  Short Term Goals:  1. Pt will initiate use of call ligh at least 50-75% during  the day before getting OOB.   2. Pt will direct safe OOB transferes with Minimum Assistant   3. Family will be able to participate with ADLs, and toileting assistance at  least during the day.  7 days from 11/19/14    PROGRESS TOWARD GOALS: Pt's wife assisted him with transfers. Pt's wife  participated with toileting assitance.    PLAN: Nursing Specific Interventions  self awareness on effected side in order to prevent neglect  Continue with the current Nursing Plan of Care.    TEAM CARE PLAN  Identified problems from team documentation:  Problem: Impaired Bladder Management    Problem: Impaired Bowel Management    Problem: Impaired Cognition  Cognition: Primary Team Goal: Patient will use strategies to compensate for lL  neglect in simple reading/writing/info management with no cues 75% of the  time./Active    Problem: Impaired Communication  Communication: Primary Team Goal: Pt will monitor for clarity, relevancy and  organization to convey mildy complex information (e.g. give directions to his  home) with occasional reminder (< 2X in 10 minute conversation)/Active    Problem: Impaired Mobility  Mobility: Primary Team Goal: Patient will ambulate household distances (50')  independently and ascend/descend 12 steps with 1 HR with modified independence  to  facilitate safe return to home environment./Active    Problem: Impaired Pain Management    Problem: Impaired Self-care Mgmt/ADL/IADL  Self Care: Primary Team Goal: Pt will complete morning routine with supervision  provided by wife./    Problem: Safety Risk and Restraint  Safety: Primary Team Goal: self awareness of effected side in order to prevent  neglect/    Add/Update Problems from this assessment:  No updates at this time.    Please review Integrated Patient View Care Plan Flowsheet for Team identified  Problems, Interventions, and Goals.  Signed by: Sherlyn Lick, RN 11/20/2014 11:00:00 PM

## 2014-11-21 NOTE — Rehab Progress Note ([facility]) (Signed)
Blake [name]  MRN: [id number]  Account: [id number]  Session Start: 11/21/2014 8:00:00 AM  Session Stop: 11/21/2014 9:00:00 AM    Speech Language Pathology  Inpatient Rehabilitation Progress Note - Brief    Rehab Diagnosis: CVA (Right Brain)  Demographics:            Age: 52Y            Gender: Male  Rehabilitation Precautions/Restrictions:   Fall    SUBJECTIVE  Pain: Patient currently without complaints of pain.    OBJECTIVE  General Observations: Pt lying in bed awake; ready for ST. Wife attended  session.    Interventions:       Speech Treatment: Targeted L inattention and pragmatics via education,  strategy training and exercises.    Visual scanning: min-mod cues to edge; halfway through cancellation task,  cognitive fatigue noted - required increased cues for attention to detail.  Possibly due to increased demand compromising use of field cut strategies.    Pragmatics: min-mod structure and feedback to provide concise questions re:  mildly abstract problem        Education Provided:    Education Provided: Plan of care. Cognitive functioning. Edging strategy for L  attention. Goal for pragmatics. .       Audience: Patient and significant other.       Mode: Explanation.  Demonstration.       Response: Verbalized understanding.   Pt needs practice    ASSESSMENT  Receptive to edging strategy but implementation inconsistent. Needed cues for  attention to detail and sustained attention. Performance likely impacted by  demand on both L attention and field cut strategies.    PLAN  Continued Speech Language Pathology is recommended to address:  Recommended Frequency/Duration/Intensity: 1;1 or group 60-120 minutes daily for  5-6 days week for 1-2 weeks duration then d/c to home  Continued Activities Contributing Toward Care Plan: Strategy training with  opportunity for practice along a hierarchy of difficulty, patient and family  education, collaboration with team including neuropsych.    3 Hour Rule Minutes: 60  minutes of SLP treatment this session count towards  intensity and duration of therapy requirement. Patient was seen for the full  scheduled time of SLP treatment this session.  Therapy Mode Minutes: Individual: 60 minutes.    Signed by: Ladell Heads, M.A., CCC-SLP 11/21/2014 9:00:00 AM

## 2014-11-21 NOTE — Rehab Progress Note ([facility]) (Signed)
NAMEAMYR SLUDER  MRN: [id number]  Account: [id number]  Session Start: 11/21/2014 11:00:00 AM  Session Stop: 11/21/2014 12:00:00 PM    Occupational Therapy  Inpatient Rehabilitation Progress Note - Brief    Rehab Diagnosis: CVA (Right Brain)  Demographics:            Age: 41Y            Gender: Male  Rehabilitation Precautions/Restrictions:   Fall Precaution, Swallow Precaution    SUBJECTIVE  Patient Report: "I'm doing ok. I slept better last night."  Pain: Patient currently has pain.  Location: R frontal/orbital region, lateral aspect of nose  Type: Acute  Quality: Aching.  Throbbing.  Pain Scale: Numeric.  Patient reports a pain level of 5 out of 10.  Patient's acceptable level of pain Unable to articulate. Pt stating he could not  pick a specirc score that was tolerable    OBJECTIVE  General Observation: Pt received seated in w/c with wife and psychologist  present. Pt alert and oriented x4. Continues to be verbose and tangential  requiring redirection throughout session. Required cues throughout for scanning  and attention to L side and ongoing difficulties navigating space    Interventions:       Therapeutic Activities:  See below for vision and coordination assessments  performed this date. Provided education to pt and wife regarding L visual field  cut vs. L neglect; pt demos awareness of L side but was visually unable to  locate objects in L visual field. No observed deficits vertically or toward R.    Pt performed gait level mobility in hallway focusing on identifying edges to  objects on L side in order to safely navigate environment in prep for home.  Required max cues to initiate head turn and eye gaze towards L. Seated rest at  end of session 2/2 head ache and fatigue; monitored vitals. Supine 119/56 HR 62,  seated 112/72 HR 70, standing 108/79 HR 86. Returned pt to bed with call light  and wife present; symptoms resolved.    Fine Motor Coordination:   Right Hand: Fine motor coordination is not  impaired.   Left Hand: Fine motor coordination is impaired. Mild dysmetria noted with  finger knee<>nose and opposition  Gross Motor Coordination: Unable to coordinate quick movements to L side without  compensatory strategies of head and trunk turn toward object  Occulomotor Screen:                       Today  TEST  Smooth Pursuit       Impaired - questionable cognition/attention limiting resu  Saccades             Impaired towards L  Vergence             Mild impairment/delay of L eye with convergence/divergenc  Range of Motion      [facility]  Acuity               L visual field cut    Pain Reassessment:  Response to Pain Intervention: Pt reporting increased comfort when seated and in  a dimly light room  Post Intervention Pain Quality:  None.  Patient Reports Post Intervention Pain Level of: 0 out of 10  Pain Acceptable: Yes    Education Provided:    Education Provided: Stroke. Stroke recovery, Emotional adjustments/depression,  Supervision/assistance requirements,  Visual deficits Safety issues and  interventions. Impaired vision. Impulsivity. Functional transfers.  Audience: Patient and significant other.       Mode: Explanation.  Demonstration.       Response: Applied knowledge.  Verbalized understanding.  Needs reinforcement.    ASSESSMENT  Performed visual and coordination assessments this session, providing education  to pt and wife. Pt demonstrates increased ability to apply compensatory  strategies when performing functional mobility but continues to require cues for  carryover. Continue IP OT POC.    PLAN  Continued Occupational Therapy is recommended.  Recommended Frequency/Duration/Intensity: 60-120 min/day,   5-6 days/wk, 7-10  days from eval  Continued Activities Contributing Toward Care Plan: Neuro re-ed,  visual/perceptual activities, ther ex, ther act, ADL retraining, AD/AE training,  DME recs, [place] planning, caregiver training/education    3 Hour Rule Minutes: 60 minutes of OT treatment this  session count towards  intensity and duration of therapy requirement. Patient was seen for the full  scheduled time of OT treatment this session.  Therapy Mode Minutes: Individual: 60 minutes.    Signed by: Murlean Caller, OTR/L 11/21/2014 12:00:00 PM

## 2014-11-21 NOTE — Rehab Progress Note ([facility]) (Signed)
Blake [name]  MRN: [id number]  Account: [id number]  Session Start: 11/21/2014 12:00:00 AM  Session Stop: 11/21/2014 12:00:00 AM    Rehabilitation Nursing  Inpatient Rehabilitation Shift Assessment    Rehab Diagnosis: CVA (Right Brain)  Demographics:            Age: 63Y            Gender: Male  Primary Language: English    Date of Onset:  11/15/14  Date of Admission: 11/19/2014 6:50:00 PM    Rehabilitation Precautions Restrictions:   Fall Precaution, Swallow Precauiton    Patient Report: "I am doing alright"  Pain: Patient currently without complaints of pain.  Pain Reassessment: Pain was not reassessed as no pain was reported.  Patient/Caregiver Goals:  to be able to go back to my routine    NEURO  Orientation/Awareness: Alert and Oriented x2.  Speech: Clear.  Verbal.  Behavior: Cooperative.    MEDICATIONS  IV Access: No IV access.  Dialysis Access: Patient does not have dialysis access.      Elopement Risk Level Assessment Tool  Patient Criteria: Patient is not capable of leaving the unit.  Assessment is not  applicable.      RISK ASSESSMENT FOR FALLS/INJURY    MENTAL STATUS CRITERIA:   10 - Impaired memory.   MENTAL STATUS TOTAL: 10    AGE CRITERIA:   35 - < 96 years old  AGE TOTAL: 0    ELIMINATION CRITERIA:   3 - Toileting with Assistance.   ELIMINATION TOTAL: 3    HISTORY OF FALLS CRITERIA:   2 - Unknown History.  HISTORY OF FALLS TOTAL: 2    MEDICATIONS CRITERIA:   2 or more High Risk Medications (*see list below)   MEDICATIONS TOTAL: 2    PHYSICAL MOBILITY CRITERIA:   3 - Decreased balance reaction.   1 - Weakness/impaired physical mobility.   PHYSICAL MOBILITY TOTAL: 4    FALLS RISK ASSESSMENT TOTAL: 21    Patient's Fall Risk: TOTAL SCORE >10: High Risk    Falls Interventions: Clutter removed and clear path to BR.  Call bell, phone, glasses, etc within reach.  Hourly toileting/safety checks between 6am and 10pm, then every 2 hours.  Initiate Fall care plan and outcome.  Yellow "high risk" patient  identification in place: wrist band, socks, chart  sticker, door sign.  Pt and family education.  Assistive devices at [facility].  Reoriented PRN.  Family at bedside.  Low bed with mat  Bed alarm    NUTRITION  Diet:  Type: Cardiac.  Food Consistency: Regular.  Liquid Consistency: Thin.      CARDIOVASCULAR     Bilateral lower extremities  Nail Bed Color: Pink.   No edema or redness present.  Pulses:   Apical Pulse: Regular. Strong. Rate is 70 .   Patient does not have a pacemaker.   Patient does not have a defibrillator.    CARDIOPULMONARY  Lung Sounds:   Upper lobes. Clear.   Lower lobes. Clear.  Type of Respirations: Regular.  Cough: No cough noted.  Respiratory Support: The patient does not require any respiratory support.  Respiratory Equipment: None. 94%    INTEGUMENTARY  Skin:  Temperature: Warm  Turgor: Normal for age  Moisture: Dry  Color of skin: Normal for Race/Ethnicity  Capillary Refill: Less than 3 seconds  Wounds/Incisions: No wounds or incisions.  Braden Scale for Predicting Pressure Sore Risk: Sensory Perception: No  impairment  Moisture: Rarely moist  Activity:  Walks occasionally  Mobility: Slightly limited  Nutrition: Adequate  Friction and Shear: No apparent problem  Braden Score: 20  Level of Risk: No risk (19-23). Will reassess every shift.    GASTROINTESTINAL  Abdomen: Soft. Nontender.  Bowel Sounds:  Active bowel sounds audible in all four quadrants.  Date of Last Bowel Movement:  11/20/14   No Problems/Complaints with Bowel Elimination Assessed.    GENITOURINARY  Current Bladder Pattern: Continent  Color:  Yellow   Patient denies problems with urination and/or catheter.    MUSCULOSKELETAL  Upper Body: left sided vission impairment, and left sided neglect  Lower Body: none    Functional Measures    EATING: Eating Score = 5. Patient is supervision/set-up for eating, requiring:  Opening containers.  Patient requires the following assistive device(s) No assistive devices were  required.    TOILETING:  Toileting Score = 4.  Patient requires minimal assistance for  toileting, such as steadying for balance while cleansing or adjusting clothes.  Patient requires the following assistive device(s):  Grab bar.    BLADDER MANAGEMENT - LEVEL OF ASSIST: Bladder Score = 7. Patient is completely  independent for bladder management. There are no activity limitations.    BLADDER ACCIDENTS THIS SHIFT:  0 . Patient has not had an accident this  assessment and did not require medications or devices.    BOWEL MANAGEMENT - LEVEL OF ASSIST: Bowel Score = 6. Patient is modified  independent for bowel management requiring: Stool softeners    BOWEL ACCIDENTS THIS SHIFT: 0 . Patient has not had an accident, but used a  stool softener.    Education Provided:    Education Provided: Precautions. Safety issues and interventions. Fall protocol.  Supervision requirements. Use of adaptive devices. Medication. Name and dosage.  Administration. Purpose. Interaction. Administration of Lovenox injections.       Audience: Patient and significant other.       Mode: Explanation.  Demonstration.       Response: Verbalized understanding.    Discharge: Patient is not being discharged at this time.    Long Term Goals:  1. Patient will demo increase self awareness on weak/ affected  side in order  to prevent neglect.  2 weeks from 11/19/14  Short Term Goals:  1. Pt will initiate use of call ligh at least 50-75% during  the day before getting OOB.   2. Pt will direct safe OOB transferes with Minimum Assistant   3. Family will be able to participate with ADLs, and toileting assistance at  least during the day.  7 days from 11/19/14    PROGRESS TOWARD GOALS: SHORT TERM GOAL REVIEW:       1. Pt will initiate use of call ligh at least 50-75% during the day before  getting OOB. - Not Met: Patient is forgetful and doesnot always remember to call  for assistance. His wife remain at his bedside for safety  Time frame to achieve short term goal(s):  7 days from  11/19/14   LONG TERM GOAL REVIEW:       1. Patient will demo increase self awareness on weak/ affected side in  order  to prevent neglect. - Not Met: Remain the same  Timeframe to achieve long term goal(s):  2 weeks from 11/19/14    PLAN: Nursing Specific Interventions  self awareness on effected side in order to prevent neglect  Continue with the current Nursing Plan of Care.    TEAM CARE PLAN  Identified problems  from team documentation:  Problem: Impaired Bladder Management    Problem: Impaired Bowel Management    Problem: Impaired Cognition  Cognition: Primary Team Goal: Patient will use strategies to compensate for lL  neglect in simple reading/writing/info management with no cues 75% of the  time./Active    Problem: Impaired Communication  Communication: Primary Team Goal: Pt will monitor for clarity, relevancy and  organization to convey mildy complex information (e.g. give directions to his  home) with occasional reminder (< 2X in 10 minute conversation)/Active    Problem: Impaired Mobility  Mobility: Primary Team Goal: Patient will ambulate household distances (50')  independently and ascend/descend 12 steps with 1 HR with modified independence  to facilitate safe return to home environment./Active    Problem: Impaired Pain Management    Problem: Impaired Self-care Mgmt/ADL/IADL  Self Care: Primary Team Goal: Pt will complete morning routine with supervision  provided by wife./Active    Problem: Safety Risk and Restraint  Safety: Primary Team Goal: self awareness of effected side in order to prevent  neglect/    Add/Update Problems from this assessment:  No updates at this time.    Please review Integrated Patient View Care Plan Flowsheet for Team identified  Problems, Interventions, and Goals.    Signed by: Ranae Pila, RN 11/21/2014 10:51:00 AM

## 2014-11-21 NOTE — Progress Notes (Signed)
PHYSICAL MEDICINE AND REHABILITATION  PROGRESS NOTE    Date Time: 11/21/2014 11:40 PM  Patient Name: Blake [name], IRION, MD    Admission date:  11/19/2014      Subjective:     Some right sided headaches/periorbital headache also described as a "brain freeze."  He is able to eat and drink.  No problems with bowel or bladder.    Received a new bed mattress that seems to be more comfortable.    Functional Status:     PT:  Therapeutic Activities: Patient requires supervision for bed mobility and  stand pivot transfers; requires cueing to negotiate legrests and bedside table  to avoid tripping over them. Continuing to remind patient to use visual scanning  before taking steps to avoid tripping. Patient requires frequent reminders of  this throughout session because when he goes to sit down, he frequently runs  into the legrest with his legs but is able to maintain his balance without  falling. Patient practiced ambulation on treadmill at 0.8 mph x6 minutes with  initial 1 UE assist because patient stated he felt uncomfortable with not having  any hand support; after first 3 mins, patient was able to let other hand off of  rail and ambulate without UE support; noted increased postural sway and  difficulty taking normal steps, very shortened shuffling steps. Patient stood in  // bars and practiced dynamic balance activities, including holding 6# med ball  and performing trunk rotations and raising up then squatting down while standing  on Blue airex pad, and stepping from one blue airex pad to another without UE  assist; requires supervision for all dynamic balance activities.  At end of session patient c/o increased pressure and discomfort in right sinus  and orbital area and asked to end the session and lay down to attempt to make it  better.    OT: Therapeutic Activities: See below for vision and coordination assessments  performed this date. Provided education to pt and wife regarding L visual field  cut vs. L neglect;  pt demos awareness of L side but was visually unable to  locate objects in L visual field. No observed deficits vertically or toward R.  Pt performed gait level mobility in hallway focusing on identifying edges to  objects on L side in order to safely navigate environment in prep for home.  Required max cues to initiate head turn and eye gaze towards L. Seated rest at  end of session 2/2 head ache and fatigue; monitored vitals. Supine 119/56 HR 62,  seated 112/72 HR 70, standing 108/79 HR 86. Returned pt to bed with call light  and wife present; symptoms resolved.     SLP:  Speech Treatment: Targeted L inattention and pragmatics via education,  strategy training and exercises.  Visual scanning: min-mod cues to edge; halfway through cancellation task,  cognitive fatigue noted - required increased cues for attention to detail.  Possibly due to increased demand compromising use of field cut strategies.  Pragmatics: min-mod structure and feedback to provide concise questions re:  mildly abstract problem    Medications:   Medication reviewed by me:     Scheduled Meds: PRN Meds:      amLODIPine 5 mg Oral Daily   aspirin 324 mg Oral Daily   docusate sodium 100 mg Oral BID   enoxaparin 30 mg Subcutaneous Q12H SCH   famotidine 20 mg Oral Q12H SCH   losartan 50 mg Oral Daily   rosuvastatin 20 mg Oral QHS  Continuous Infusions:      meclizine 25 mg Q6H PRN   oxyCODONE 5 mg Q3H PRN           Review of Systems:   A comprehensive review of systems was: No fevers, chills, nausea, vomiting, chest pain, shortness of breath, cough, headache, double vision.  All others negative.    Physical Exam:     Filed Vitals:    11/21/14 1148 11/21/14 1155 11/21/14 1844 11/21/14 1845   BP: 112/72 108/79 140/76    Pulse: 70 86 70    Temp:   98.6 F (37 C)    TempSrc:       Resp:   20    Height:       Weight:       SpO2:   91% 93%       Intake and Output Summary (Last 24 hours) at Date Time  No intake or output data in the 24 hours ending  11/21/14 2340                    Cardiac: regular rhythm  Chest / Lungs:  Clear to auscultation.  Abdomen:  + bowel sounds, Soft, non-tender, non-distended.  Extremities: no calf tenderness.    Labs:   No results for input(s): GLUCOSEWHOLE in the last 24 hours.      Recent Labs  Lab 11/15/14  1608   WBC 8.70   HEMOGLOBIN 18.1*   HEMATOCRIT 51.4   PLATELETS 259          Recent Labs  Lab 11/19/14  0352 11/16/14  0356 11/15/14  1609   SODIUM 140 142 141   POTASSIUM 4.0 4.0 4.2   CHLORIDE 108 111 106   CO2 24 23 23    BUN 22 23 27    CREATININE 1.1 1.1 1.5*   CALCIUM 9.5 9.4 10.6*   ALBUMIN  --   --  4.9   PROTEIN, TOTAL  --   --  7.9   BILIRUBIN, TOTAL  --   --  0.5   ALKALINE PHOSPHATASE  --   --  83   ALT  --   --  49   AST (SGOT)  --   --  28   GLUCOSE 100 101* 146*         Recent Labs  Lab 11/15/14  1608   PT INR 1.0       Results     Procedure Component Value Units Date/Time    Urine culture [[id number]] Collected:  11/20/14 0015    Specimen Information:  Urine / Urine, Clean Catch Updated:  11/21/14 0258    Narrative:      ORDER#: [id number]                                    ORDERED BY: Ellen Henri, ALI  SOURCE: Urine, Clean Catch                           COLLECTED:  11/20/14 00:15  ANTIBIOTICS AT COLL.:                                RECEIVED :  11/20/14 04:56  Culture Urine  FINAL       11/21/14 02:58   +  11/21/14   1,000 - 9,000 CFU/ML Enterococcus species               No further work,             Questionable significance due to low quantity          Previous quantitation was 10,000 - 30,000 CFU/ML, verified by [id number] at 02:39 on 11/21/14      MRSA culture [[id number]] Collected:  11/19/14 1956    Specimen Information:  Body Fluid / Nasal/Throat ASC Admission Updated:  11/21/14 0043    Narrative:      ORDER#: [id number]                                    ORDERED BY: Ellen Henri, ALI  SOURCE: Nares and Throat                             COLLECTED:  11/19/14 19:56  ANTIBIOTICS AT COLL.:                                 RECEIVED :  11/20/14 00:47  Culture MRSA Surveillance                  FINAL       11/21/14 00:43  11/21/14   Negative for Methicillin Resistant Staph aureus from Nares and             Negative for Methicillin Resistant Staph aureus from Throat                 Rads:   Radiological Procedure reviewed.  Radiology Results (24 Hour)     ** No results found for the last 24 hours. **              Assessment and Plan:     1. 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  - Continue with PT, OT, and speech therapy.  Continue with psychology/neuropsychology.  - Right-sided headache/discomfort--has been ongoing  - We had a long discussion about medications:  - Meclizine 25 mg every 6 hours as needed and oxycodone 5 mg as needed for pain--we agreed upon these medications.  - Pepcid 20 mg twice a day to help with his stomach symptoms.  2. Atherosclerotic cerebrovascular disease.  - Continue with aspirin and Crestor.  3. Hypertension.  - Followup trend with Norvasc 5 mg a day and losartan 50 mg a day.  4. Hyperlipidemia.  - Continue with Crestor.  5. Left visual field cut.  - noted.    Patient Active Problem List   Diagnosis   . Loss of peripheral visual field, left   . Acute CVA (cerebrovascular accident)   . Essential hypertension   . Stroke       Continue comprehensive and intensive inpatient rehab program, including:   Physical therapy 60-120 min daily, 5-6 times per week, Occupational therapy 60-120 min daily, 5-6 times per week, Case management and Rehabilitation nursing    Signed by: Virl Cagey MD    [facility]

## 2014-11-21 NOTE — Rehab Progress Note ([facility]) (Signed)
[name]  MRN: [id number]  Account: [id number]  Session Start: 11/21/2014 1:00:00 PM  Session Stop: 11/21/2014 1:45:00 PM    Physical Therapy  Inpatient Rehabilitation Progress Note - Brief    Rehab Diagnosis: CVA (Right Brain)  Demographics:            Age: 27Y            Gender: Male  Rehabilitation Precautions/Restrictions:   Fall    SUBJECTIVE  Patient Report: "I was having this disagreeable sensation in my head that has  lessened now that I have laid down and taken a nap, but it gets worse when I'm  up doing activity"  Pain: "Discomfort" in right sinus region    OBJECTIVE  Interventions:       Therapeutic Activities:  Patient requires supervision for bed mobility and  stand pivot transfers; requires cueing to negotiate legrests and bedside table  to avoid tripping over them. Continuing to remind patient to use visual scanning  before taking steps to avoid tripping. Patient requires frequent reminders of  this throughout session because when he goes to sit down, he frequently runs  into the legrest with his legs but is able to maintain his balance without  falling. Patient practiced ambulation on treadmill at 0.8 mph x6 minutes with  initial 1 UE assist because patient stated he felt uncomfortable with not having  any hand support; after first 3 mins, patient was able to let other hand off of  rail and ambulate without UE support; noted increased postural sway and  difficulty taking normal steps, very shortened shuffling steps. Patient stood in  // bars and practiced dynamic balance activities, including holding 6# med ball  and performing trunk rotations and raising up then squatting down while standing  on Blue airex pad, and stepping from one blue airex pad to another without UE  assist; requires supervision for all dynamic balance activities.  At end of session patient c/o increased pressure and discomfort in right sinus  and orbital area and asked to end the session and lay down to attempt to  make it  better. RN notified.  Pain Reassessment: Increased discomfort and pain, pressure in right sinus and  orbital region. RN notified.    ASSESSMENT  Patient unable to complete therapy session due to facial pain. Stated it gets  better when he lays down and that he would feel better laying down and resting  to help it go away.    PLAN  Continued Physical Therapy is recommended.  Recommended Frequency/Duration/Intensity: 60-120 minutes/day, for 7-10 days from  initial evaluation  Activities Contributing Toward Care Plan: Therapeutic exercise, therapeutic  activities, visual adaptation techniques, balance training, coordination  training, gait and stair negotiation training, obstacle negotiation, and family  training and discharge planning all with an interdisciplinary approach to care    3 Hour Rule Minutes: 45 minutes of PT treatment this session count towards  intensity and duration of therapy requirement. Patient was not seen for the full  scheduled time of PT treatment this session secondary to: Patient not tolerate  secondary to pain. Notified nurse and MD.  Will attempt to see patient tomorrow,  as scheduled  Therapy Mode Minutes: Individual: 45 minutes.    Signed by: Malachy Mood, PT 11/21/2014 1:45:00 PM

## 2014-11-22 NOTE — Rehab PSY Consult ([facility]) (Addendum)
Corrected 11/23/2014 6:05:44 PM    NAME: Blake [name]  MRN: [id number]  Account: [id number]  Session Start: 11/21/2014 10:00:00 AM  Session Stop: 11/21/2014 11:00:00 AM    Psychology Services  Inpatient Rehabilitation Consultation    Rehab Diagnosis: CVA (Right Brain)  Demographics:            Age: 63Y            Gender: Male    Past Medical History: Hypertension and hyperlipidemia  History of Present Illness: per HandP: The patient is a 63 year old right-hand  dominant gentleman who was admitted  to [facility] on November 15, 2014, with left visual field  cut as well as left-sided sensory loss and poor coordination. MRI of the  brain revealed a right-sided occipital stroke. A few days prior to  admission, he started experiencing headaches and left-sided numbness and  decreased visual field. 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 He has been started on 325 mg of aspirin a day  as well as 20 mg of Crestor.            Date of Onset: 11/15/14            Date of Admission: 11/19/2014 6:50:00 PM    Medications and Allergies: Significant rehabilitation considerations:   NKDA  Rehabilitation Precautions/Restrictions:   Fall    SUBJECTIVE  Premorbid Functional Level: Patient reported independent, gainfully employed, +  driving  Understanding of Current Condition: superficial, but improving  Patient/Caregiver Goals:  Patient's functional goals: to be able to go back to  my routine  Pain: Patient currently without complaints of pain.  Social History: Patient is a Insurance account manager.  He lives in [place] with his  supportive wife.  Their son, who has boy and girl, lives in [place] as well.  Their daughter with her 2 boys lives in [place].    OBJECTIVE  Education Level:  MD  Behavioral Observations and Mental Status:  Pt seen for initial psychological  consultation to assess adjustment to medical condition  and rehab  hospitalization.  Chart reviewed.  Wife present.   Clarified my role. Pt. is  awake and alert and Ox4 w/ cog sufficient for psych eval. He needs redirection  to sustain  attention in a structured interview, as thinking is somewhat  tangential.  Language is extremely verbose and circular, fraught with over  elaboration.  Underneath, there  is somewhat superficial understanding of the  problems at hand.  Remote episodic memory appears overall intact and patient is  well aware of therapies and current medical issues.  There are no signs of  perseveration, confabulations, delusions, or hallucinations.  Patient reports  mild memory difficulties, mostly pertaining to a blurred time line, difficulty  explaining himself and recognition that he goes ?on and on.?  He also reports  both a field cut and neglect.  While he does show intellectual awareness of at  least some deficits, he shows poor predictive awareness.  He is however open to  feedback regarding readiness to return to work, as is his wife.    Patient presents with overall neutral mood and denies any depression.  However,  he does endorse anxiety regarding the uncertainty of the situation, particularly  in terms of returning to work.  It appears that given patient?s ?Money phobia?  as his wife labels it, the family is in financial difficulties, and therefore  his return to work is that much more relevant.  Patient reports good sleep  premorbidly but current difficulties secondary to hospital environment,  particularly the bed.  Patient does not know smoke and does not drink at all  secondary to migraines.  He denies any psychiatric or substance abuse history.    Pain Reassessment: Pain was not reassessed as no pain was reported.    Interdisciplinary Educational Needs and Learning Preferences:       Learning Preference:       Barriers to Learning: Cognitive limitations.       Learning Needs: Leisure/vocational, Plan of care, Rehabilitation techniques  and procedures, Stroke    Education Provided: Plan of care. Rehab  techniques and procedures. . Stroke.  Stroke recovery       Audience: Patient and significant other.       Mode: Explanation.       Response: Verbalized understanding.    Interventions:   NPSY HLTH ASSESS INI EA 15 MIN    ASSESSMENT  Impressions:  Patient presents with higher level cognitive deficits, see SLP for  more detail, as well as field cut and possibly neglect, impeding his return to  work in the short?medium term and  possibly in the Alveria Mcglaughlin-term as well.  Patient  has some good intellectual awareness while predictive awareness needs to be  further developed.  He is however responsive to feedback.    Patient and wife present with anxiety regarding his recovery.    Patient with poor sleep secondary to hospital environment.  Potential to Benefit: Able to participate in an intensive inpatient  interdisciplinary rehabilitation program, Good family/social support, Good  premorbid functional status, Living in the community premorbidly, Motivated  Barriers to Progress/Discharge: No potential barriers to progress.      Short Term Goals:  Time frame to achieve short term goal(s): LOS       1. Pt. will improve adjustment to medical condition and rehab process to  function at the min A level       2. Pt. and family will process ed re CVA, including any psych/adjustment  implications and return to work issues as evidenced by verbalization  Rolanda Campa Term Goals:  Not applicable.    PLAN  Psychology services are recommended to address: adjustment and ed  Recommendations:  counseling      Care Plan  Identified problems from team documentation:  Problem: Impaired Bladder Management    Problem: Impaired Bowel Management    Problem: Impaired Cognition  Cognition: Primary Team Goal: Patient will use strategies to compensate for lL  neglect in simple reading/writing/info management with no cues 75% of the  time./Active    Problem: Impaired Communication  Communication: Primary Team Goal: Pt will monitor for clarity, relevancy  and  organization to convey mildy complex information (e.g. give directions to his  home) with occasional reminder (< 2X in 10 minute conversation)/Active    Problem: Impaired Leisure Skills  Leisure Skills: Primary Team Goal: Pt will demonstrate appropriate safety  awareness, mobility, and cognition in order to participate in a leisure activity  of interest at mod I level at discharge/Active    Problem: Impaired Mobility  Mobility: Primary Team Goal: Patient will ambulate household distances (50')  independently and ascend/descend 12 steps with 1 HR with modified independence  to facilitate safe return to home environment./Active    Problem: Impaired Pain Management    Problem: Impaired Self-care Mgmt/ADL/IADL  Self Care: Primary Team Goal: Pt will complete morning routine with supervision  provided by wife./Active    Problem:  Safety Risk and Restraint  Safety: Primary Team Goal: self awareness of effected side in order to prevent  neglect/    Identified problems from this assessment:     Psychosocial : Pt. will improve adjustment to medical condition and rehab  process to function at the min A level    Discipline:  Neuropsychology/Psychology    Please review Integrated Patient View Care Plan Flowsheet for Team identified  Problems, Interventions, and Goals.    Signed by: Albertina Senegal, Psy.D, Clinical Neuropsychologist 11/21/2014 11:00:00 AM

## 2014-11-22 NOTE — Rehab Progress Note ([facility]) (Signed)
[name]  MRN: [id number]  Account: [id number]  Session Start: 11/22/2014 12:00:00 AM  Session Stop: 11/22/2014 12:00:00 AM    Rehabilitation Nursing  Inpatient Rehabilitation Shift Assessment    Rehab Diagnosis: CVA (Right Brain)  Demographics:            Age: 63Y            Gender: Male  Primary Language: English    Date of Onset:  11/15/14  Date of Admission: 11/19/2014 6:50:00 PM    Rehabilitation Precautions Restrictions:   Fall    Patient Report: Hello  Pain: Patient currently without complaints of pain.  Pain Reassessment: Pain was not reassessed as no pain was reported.  Patient/Caregiver Goals:  to be able to go back to my routine    NEURO  Orientation/Awareness: Alert and Oriented x4.  Speech: No deficits noted at this time.  Behavior: Cooperative.    MEDICATIONS  IV Access: No IV access.  Dialysis Access: Patient does not have dialysis access.      Elopement Risk Level Assessment Tool  Patient Criteria: Patient is not capable of leaving the unit.  Assessment is not  applicable.      RISK ASSESSMENT FOR FALLS/INJURY    MENTAL STATUS CRITERIA:   0 - None identified.  MENTAL STATUS TOTAL: 0    AGE CRITERIA:   63 - < 65 years old  AGE TOTAL: 0    ELIMINATION CRITERIA:   0 - None identified.  ELIMINATION TOTAL: 0    HISTORY OF FALLS CRITERIA:   2 - Unknown History.  HISTORY OF FALLS TOTAL: 2    MEDICATIONS CRITERIA:   2 or more High Risk Medications (*see list below)   MEDICATIONS TOTAL: 2    PHYSICAL MOBILITY CRITERIA:   1 - Weakness/impaired physical mobility.   3 - Decreased balance reaction.   PHYSICAL MOBILITY TOTAL: 4    FALLS RISK ASSESSMENT TOTAL: 8    Patient's Fall Risk: TOTAL SCORE is <=10:  Low Risk.    Falls Interventions: Clutter removed and clear path to BR.  Call bell, phone, glasses, etc within reach.  Hourly toileting/safety checks between 6am and 10pm, then every 2 hours.  Initiate Fall care plan and outcome.    NUTRITION  Diet:  Type: Cardiac. Cardiac.  Food Consistency:  Regular.  Liquid Consistency: Thin.      CARDIOVASCULAR     Bilateral lower extremities  Nail Bed Color: Pink.   No edema or redness present.  Pulses:   Apical Pulse: Regular. Strong. Rate is 68 .   Patient does not have a pacemaker.   Patient does not have a defibrillator.    CARDIOPULMONARY  Lung Sounds:   Upper lobes. Clear.   Lower lobes. Clear.  Type of Respirations: Regular.  Cough: No cough noted.  Respiratory Support: The patient does not require any respiratory support.  Respiratory Equipment: None. 97% on ra    INTEGUMENTARY  Skin:  Temperature: Warm  Turgor: Normal for age  Moisture: Dry  Color of skin: Normal for Race/Ethnicity  Capillary Refill: Less than 3 seconds  Wounds/Incisions: No wounds or incisions.  Braden Scale for Predicting Pressure Sore Risk: Sensory Perception: No  impairment  Moisture: Rarely moist  Activity: Walks occasionally  Mobility: Slightly limited  Nutrition: Adequate  Friction and Shear: No apparent problem  Braden Score: 20  Level of Risk: No risk (19-23). Will reassess every shift.    GASTROINTESTINAL  Abdomen: Soft. Nontender.  Bowel Sounds:  Active bowel sounds audible in all four quadrants.  Date of Last Bowel Movement:  12/15   No Problems/Complaints with Bowel Elimination Assessed.    GENITOURINARY  Current Bladder Pattern: Continent  Color:  Yellow   Patient denies problems with urination and/or catheter.    MUSCULOSKELETAL  Upper Body: left sided vision impairment, and left sided neglect  Lower Body: none    Functional Measures    EATING: Eating Score = 5. Patient is supervision/set-up for eating, requiring:  Opening containers.  Cutting meat.  Patient requires the following assistive device(s) No assistive devices were  required.    TOILETING: Toileting Score = 5.  Patient is supervision/set-up for toileting,  requiring: Stand by assistance.  Patient requires the following assistive device(s):  Grab bar.    BLADDER MANAGEMENT - LEVEL OF ASSIST: Bladder Score = 7.  Patient is completely  independent for bladder management. There are no activity limitations.    BLADDER ACCIDENTS THIS SHIFT:  0 . Patient has not had an accident this  assessment and did not require medications or devices.    Education Provided:    Education Provided: Safety issues and interventions. Fall protocol. Impaired  vision. Supervision requirements. Medication. Name and dosage. Medication. Name  and dosage.       Audience: Patient.       Mode: Explanation.       Response: Verbalized understanding.    Discharge: Patient is not being discharged at this time.    Long Term Goals:  1. Patient will demo increase self awareness on weak/ affected  side in order  to prevent neglect. - Goal Not Met  2 weeks from 11/19/14  Short Term Goals:  1. Pt will initiate use of call ligh at least 50-75% during  the day before getting OOB. - Goal Not Met   2. Pt will direct safe OOB transferes with Minimum Assistant   3. Family will be able to participate with ADLs, and toileting assistance at  least during the day.  7 days from 11/19/14    PROGRESS TOWARD GOALS: Patient's cognition was clear and he followed clear and  logical sequence of thought,,    PLAN: Nursing Specific Interventions  self awareness on effected side in order to prevent neglect  Continue with the current Nursing Plan of Care.    TEAM CARE PLAN  Identified problems from team documentation:  Problem: Impaired Bladder Management    Problem: Impaired Bowel Management    Problem: Impaired Cognition  Cognition: Primary Team Goal: Patient will use strategies to compensate for lL  neglect in simple reading/writing/info management with no cues 75% of the  time./Active    Problem: Impaired Communication  Communication: Primary Team Goal: Pt will monitor for clarity, relevancy and  organization to convey mildy complex information (e.g. give directions to his  home) with occasional reminder (< 2X in 10 minute conversation)/Active    Problem: Impaired  Mobility  Mobility: Primary Team Goal: Patient will ambulate household distances (50')  independently and ascend/descend 12 steps with 1 HR with modified independence  to facilitate safe return to home environment./Active    Problem: Impaired Pain Management    Problem: Impaired Self-care Mgmt/ADL/IADL  Self Care: Primary Team Goal: Pt will complete morning routine with supervision  provided by wife./Active    Problem: Safety Risk and Restraint  Safety: Primary Team Goal: self awareness of effected side in order to prevent  neglect/    Add/Update Problems from this assessment:  No updates at  this time.    Please review Integrated Patient View Care Plan Flowsheet for Team identified  Problems, Interventions, and Goals.    Signed by: Dolores Hoose, RN 11/22/2014 1:36:00 AM

## 2014-11-22 NOTE — Progress Notes (Signed)
PROGRESS NOTE    Date Time: 11/22/2014 3:12 PM  Patient Name: [name], MD      Assessment:   SP CVA  Hypertension history, controlled  Symptoms of lightheadedness at times with rehab, amlodipine was stopped this am.      Plan:   No changes from the cardiac view  Will follow as needed.  Eventual cardiac stress nuclear scan  Subjective:   Feels OK    Medications:     Current Facility-Administered Medications   Medication Dose Route Frequency   . aspirin  324 mg Oral Daily   . docusate sodium  100 mg Oral BID   . enoxaparin  30 mg Subcutaneous Q12H SCH   . famotidine  20 mg Oral Q12H SCH   . losartan  50 mg Oral Daily   . rosuvastatin  20 mg Oral QHS       Review of Systems:   A comprehensive review of systems was: History obtained from the patient  General ROS: negative  Respiratory ROS: no cough, shortness of breath, or wheezing  Cardiovascular ROS: no chest pain or dyspnea on exertion    Physical Exam:     Filed Vitals:    11/22/14 0843   BP: 133/88   Pulse:    Temp:    Resp:    SpO2:        Intake and Output Summary (Last 24 hours) at Date Time    Intake/Output Summary (Last 24 hours) at 11/22/14 1512  Last data filed at 11/22/14 0900   Gross per 24 hour   Intake    180 ml   Output      0 ml   Net    180 ml       General appearance - alert, well appearing, and in no distress  Mental status - alert, oriented to person, place, and time  Chest - clear to auscultation, no wheezes, rales or rhonchi, symmetric air entry  Heart - normal rate, regular rhythm, normal S1, S2, no murmurs, rubs, clicks or gallops    Labs:     Results     ** No results found for the last 24 hours. **          Recent CBC No results for input(s): RBC, HGB, HCT, MCV, MCH, MCHC, RDW, MPV, LABPLAT in the last 24 hours.    Invalid input(s): WHITEBLOODCE,  NRBCA,  REFLX,  ANRBA    Rads:   Radiological Procedure reviewed.    Signed by: Royann Shivers

## 2014-11-22 NOTE — Rehab Progress Note ([facility]) (Signed)
NAMEMICCO BOURBEAU  MRN: [id number]  Account: [id number]  Session Start: 11/22/2014 12:00:00 AM  Session Stop: 11/22/2014 12:00:00 AM    SEVERE SEPSIS SCREEN  INFECTION:  Patient has no indication of infection.  Negative Sepsis Screen.  If you are unable to assess a system's dysfunction because you do not have labs,  or the labs you have are not current (within 24 hours), call physician and  request and order for the lab tests needed.    Signed by: Sherlyn Lick, RN 11/22/2014 9:00:00 PM

## 2014-11-22 NOTE — Rehab Progress Note ([facility]) (Signed)
NAMEAABAN GRIEP  MRN: [id number]  Account: [id number]  Session Start: 11/22/2014 9:00:00 AM  Session Stop: 11/22/2014 10:00:00 AM    Speech Language Pathology  Inpatient Rehabilitation Progress Note - Brief    Rehab Diagnosis: CVA (Right Brain)  Demographics:            Age: 73Y            Gender: Male  Rehabilitation Precautions/Restrictions:   Fall    SUBJECTIVE  Patient Report: "I have no reason to believe so" (agreeing that pragmatic  exercises are not intended to change baseline personality)  Pain: Patient currently without complaints of pain.    OBJECTIVE  General Observations: Pt lying in bed, listening to iPod. Ready for ST.    Interventions:       Speech Treatment: Targeted pragmatics for clear, concise communication.  Needed mod structure to organize thoughts, self-monitor and terminate when  communicating mod-complex info.    Visual scanning: cue x 1 for scanning "big picture"; cues 25% of time due to  errors in working memory      ASSESSMENT  Benefits from written preplanning to organize thoughts prior to verbalizing.  Needs reminders to reference aides for monitoring/adhering to time constraints.    Good L attention for minimally structured scanning but accuracy limited by  working memory deficits.      PLAN  Continued Speech Language Pathology is recommended to address:  Recommended Frequency/Duration/Intensity: 1;1 or group 60-120 minutes daily for  5-6 days week for 1-2 weeks duration then d/c to home  Continued Activities Contributing Toward Care Plan: Strategy training with  opportunity for practice along a hierarchy of difficulty, patient and family  education, collaboration with team including neuropsych.    3 Hour Rule Minutes: 60 minutes of SLP treatment this session count towards  intensity and duration of therapy requirement. Patient was seen for the full  scheduled time of SLP treatment this session.  Therapy Mode Minutes: Individual: 60 minutes.    Signed by: Ladell Heads, M.A.,  CCC-SLP 11/22/2014 10:00:00 AM

## 2014-11-22 NOTE — Progress Notes (Signed)
IRF Physiatry Attending Face to Face Progress Note   Functional Status/Update:   I reviewed patient's therapy notes to assess functional status and ongoing need for therapies. Of note, patient doing well in therapy and is learning to cope and adjust to impairments quickly.    Subjective:   Feels well and is without complaints. No pain. No headache. No constipation. No chest pain. No shortness of breath. Therapy limited yesterday due to head pain that was helped with rest. Similar sensation compared to prodrome of stroke vs migraine.     Objective:   Filed Vitals:    11/21/14 1844 11/21/14 1845 11/22/14 0517 11/22/14 0843   BP: 140/76  138/73 133/88   Pulse: 70  59    Temp: 98.6 F (37 C)  97 F (36.1 C)    TempSrc:   Oral    Resp: 20  17    Height:       Weight:       SpO2: 91% 93% 97%        Physical Examination:   Appears well.  In no acute distress. Overweight.   Conjunctivae non-injected. EOMI.   Symmetric facies. Hearing grossly intact. Moist mucous membranes.   +S1S2 Heart rate and rhythm are regular. No murmurs/rubs/gallops. No significant lower limb edema.   Lungs are clear to auscultation bilaterally. No wheezes, rales, or rhonchi. Good respiratory effort.   Soft. Non-tender. Normoactive bowel sounds.   Alert Pleasant and cooperative. Slow methodical thinking/speech, non tangential, more circular pattern. Perseverates.    New Labs:  Results     ** No results found for the last 24 hours. **          Current medications:   Scheduled Meds:  Current Facility-Administered Medications   Medication Dose Route Frequency   . aspirin  324 mg Oral Daily   . docusate sodium  100 mg Oral BID   . enoxaparin  30 mg Subcutaneous Q12H SCH   . famotidine  20 mg Oral Q12H SCH   . losartan  50 mg Oral Daily   . rosuvastatin  20 mg Oral QHS     PRN Meds:.meclizine, oxyCODONE      Assessment: 63 y.o. male with h/o HLD, HTN now s/p R PCA CVA.  Plan:   REHAB: Continue comprehensive and intensive inpatient rehab program,  including:   Physical therapy 60-120 min daily, 5-6 times per week, Occupational therapy 60-120 min daily, 5-6 times per week, Speech therapy 60-120 min daily, 5-6 times per week, Case management and Rehabilitation nursing    Will continue to address the following impairments and issues:  Mobility, ADLs, Aphasia, Cognitive impairments, Impaired ROM, Impaired endurance, Adherence to precautions, Caregiver training, Medication management, Adjustment to disability, Community support and resources, Impaired coordination, Impaired balance and Coping strategies    R PCA stroke: rehab above for ADL dysfunction and retraining.Rehab psychology following. Bp goal 130-150. Neuro to plan on doing CTA for better visualization of carotid stenosis.  VTE prophy: full dose aspirin  HTN: on losartan, holding amlodipine for above goal  HLD: started on rosuvastatin this hospitalization  Dispo: [facility] 1 week,  F/U: with neurology, outpatient  I have discussed the plan of care with the resident physician and examined patient independently.  I have reviewed the note above and concur with findings and plan.  Additional comments are:  Doing well in tx.  Discussed with the pt re: status and tx.

## 2014-11-22 NOTE — Rehab Evaluation ([facility]) (Addendum)
Corrected 11/22/2014 4:10:37 PM    NAME: Blake [name]  MRN: [id number]  Account: [id number]  Session Start: 11/22/2014 2:00:00 PM  Session Stop: 11/22/2014 3:00:00 PM    Therapeutic Recreation  Inpatient Rehabilitation Initial Evaluation    Risks/Benefits of Rehabilitation Discussed with Patient/Caregiver: Yes.    Rehab Diagnosis: CVA (Right Brain)  Demographics:            Age: 63Y            Gender: Male  Primary Language: English    Past Medical History: Hypertension and hyperlipidemia  History of Present Illness: per HandP: The patient is a 63 year old right-hand  dominant gentleman who was admitted  to [facility] on November 15, 2014, with left visual field  cut as well as left-sided sensory loss and poor coordination. MRI of the  brain revealed a right-sided occipital stroke. A few days prior to  admission, he started experiencing headaches and left-sided numbness and  decreased visual field. 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 He has been started on 325 mg of aspirin a day  as well as 20 mg of Crestor.            Date of Onset: 11/15/14            Date of Admission: 11/19/2014 6:50:00 PM    Medications and Allergies: Significant rehabilitation considerations:   NKDA  Rehabilitation Precautions/Restrictions:   Fall, left visual field cut/left neglect with issues of obstacle negotiation    SUBJECTIVE  Premorbid Functional Level: Patient reported Independent, gainfully employed, +  driving  Understanding of Current Condition: Patient has fair understanding of functional  status. Patient is able to state that he is impaired by left visual field cut  and has trouble with obstacle negotiation, but has decreased insight into  supervision that is required, that he needs more training to adapt to vision  issues.  Patient/Caregiver Goals:  Patient's functional goals: to be able to go back to  my routine  Pain: Patient currently without complaints of  pain.  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  Recreational Activities/Hobbies:  Active, works, independent, drives    OBJECTIVE  Physical Limitations: Pt with decreased balance and decreased coordination, L  visual field cut and L inattention which impact safety and functional mobility    Cognition/Behavior: Pt is agreeable to eval and activity participation as "[he]  sees the benefit of such activity participation." Pt is verbose and tangential  in conversation, highly educated and clarity of ideas is sometimes lost due to  word choice. Pt asks appropriate, though lengthy, questions.    Leisure Interests:  Pt reports an interest in caligraphy. Pt also enjoys reading. Pt is interested  to learn the IPAD and Wii.    Pain Reassessment: Pain was not reassessed as no pain was reported.    Interdisciplinary Educational Needs and Learning Preferences:       Learning Preference: The patient's preferred learning method is:  Explanation.  The patient's preferred learning method is: Demonstration.  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       Barriers to Learning: Cognitive limitations.  Visual deficits.       Learning Needs: Leisure/vocational    Education Provided: Benefit and value of leisure activity. Strategies for  adapting activities.       Audience: Patient.       Mode: Explanation.  Demonstration.       Response: Verbalized understanding.  Demonstrated skill.  Needs practice.    ASSESSMENT  Summary of Deficits and Related Problems: Patient presents with the following  deficits: Decreased Activity Level. Decreased Community Functioning. Decreased  Functional Mobility. Decreased Leisure Independence.  These deficits are secondary to: Impaired Balance.  Impaired Coordination.  Cognitive Deficits. Visual Deficits.  Rehab Potential: Able to participate in an intensive inpatient interdisciplinary  rehabilitation program, Good family/social support, Good premorbid functional  status, Good premorbid medical status, Living in the community premorbidly,  Motivated  Barriers to Progress/Discharge: No potential barriers to progress.    Long Term Goals: Time frame to achieve long term goal(s): 2 weeks       1. Pt will demonstrate improved safety awareness through the ability to  navigate busy dayroom setting at ambulatory level req no more than min VCs       2. Pt will incorporate edging strategies throughout visual and written  leisure tasks with no more than min VCs       3. Pt will identify 1 new leisure activity of interest and strategies for  continued participation postdischarge       4.  Pt will participate in leisure activity of interest at mod I level at  discharge  Short Term Goals: Not applicable.    Recommendations/Goals for Rehabilitation Discussed with Patient/Caregiver: Yes.    Pt educated on TR role, program, and activity options and oriented to TR dayroom  setting. Pt demonstrating decreased safety awareness (pt found amb in room  to/from restroom without supervision). Pt reports "the current reports are  overly careful and I feel I can get myself around safely" despite therapists'  education. Pt is interested to learn Wii and IPAD activities and is eager to use  the dayroom setting for family visits. Pt may benefit from TR services to  promote balance, coordination, leisure education and support, L attention,  safety, and socialization in preparation for community re-entry.    PLAN  Therapeutic Recreation services are recommended to address: balance,  coordination, safety, L attention, leisure education and support, socialization,  community re-entry    Care Plan  Identified problems from team documentation:  Problem: Impaired Bladder Management    Problem:  Impaired Bowel Management    Problem: Impaired Cognition  Cognition: Primary Team Goal: Patient will use strategies to compensate for lL  neglect in simple reading/writing/info management with no cues 75% of the  time./Active    Problem: Impaired Communication  Communication: Primary Team Goal: Pt will monitor for clarity, relevancy and  organization to convey mildy complex information (e.g. give directions to his  home) with occasional reminder (< 2X in 10 minute conversation)/Active    Problem: Impaired Mobility  Mobility: Primary Team Goal: Patient will ambulate household distances (50')  independently and ascend/descend 12 steps with 1 HR with modified independence  to facilitate safe return to home environment./Active    Problem: Impaired Pain Management    Problem: Impaired Self-care Mgmt/ADL/IADL  Self Care: Primary Team Goal: Pt will complete morning routine with supervision  provided by wife./Active    Problem: Safety Risk and Restraint  Safety: Primary Team Goal: self awareness of effected side in order to prevent  neglect/    Identified problems from this assessment:     Leisure Skills : Pt will demonstrate appropriate safety awareness, mobility,  and cognition in order to participate in a leisure activity of interest at mod I  level at discharge    Discipline:  Therapeutic Recreation    Please review Integrated Patient View Care Plan Flowsheet for Team identified  Problems, Interventions, and Goals.    Signed by: Conception Oms, CTRS 11/22/2014 3:00:00 PM

## 2014-11-22 NOTE — Rehab Progress Note ([facility]) (Signed)
NAMEXZAVIEN HARADA  MRN: [id number]  Account: [id number]  Session Start: 11/22/2014 1:05:00 PM  Session Stop: 11/22/2014 1:45:00 PM    Psychology Services  Inpatient Rehabilitation Progress Note    Rehab Diagnosis: CVA (Right Brain)  Demographics:            Age: 53Y            Gender: Male    Medications and Allergies: Significant rehabilitation considerations:   NKDA  Rehabilitation Precautions/Restrictions:   Fall    SUBJECTIVE  Patient Reports: ?I guess I?m able to compartmentalize.?  Patient/Caregiver Goals:  to be able to go back to my routine  Pain: Patient currently without complaints of pain.    OBJECTIVE  General Observation: Following up with patient regarding adjustment and sleep w/   supportive therapy.  Pain Reassessment: Pain was not reassessed as no pain was reported.    Education Provided:    Education Provided: Stroke. Stroke recovery       Audience: Patient.       Mode: Explanation.       Response: Verbalized understanding.    Interventions:   NPSY INDIVID HLTH INTERV    ASSESSMENT  Impressions:  The patient presents with improved clinical picture, particularly  improvement in language and demonstrates no tangential thinking.  Mood is  euthymic, and although he continues to endorse concerns regarding his  rehabilitation and ability to return to work, he is more calm and reports ?s.?  He is open to delegating financial aspects to a trusted person of choice and  believes  this would lift a weight off his shoulders.  He reports sleeping  better with a regular bed.    PLAN  Continued Psychology services are recommended to address: adjustment    Signed by: Albertina Senegal, Psy.D, Clinical Neuropsychologist 11/22/2014 1:45:00 PM

## 2014-11-22 NOTE — Rehab PPS CMG ([facility]) (Addendum)
Corrected 01/22/2015 4:49:15 PM    NAME: Blake [name]  MRN: [id number]  Account: [id number]  Session Start: 11/21/2014 12:00:00 AM  Session Stop: 11/21/2014 12:00:00 AM    PPS CMG Coordinator  Inpatient Rehabilitation Admission    IRF Admission Date:  11/19/2014      Admission Class: Initial Rehab.  [facility]  Pre-Hospital Living: Home. Pre-Hospital Living  With: (2) Family/Relatives.  Payer Source: Primary: Not Listed.  Additional Information: M[employer]-HMO.  Secondary: Not Listed.  Additional Information: None.  Ethnic Group:  Caucasian.  Marital Status:  Marital Status: Married.    Impairment Group: 01.4 No Paresis  Date of Onset of Impairment: 11/15/2014    Etiologic Diagnosis Code(s):   Rank Code      Description  1    I63.531   Cerebral infarction due to unspecified                 occlusion or stenosis of right posterior                 cerebral artery    Comorbidities:   Rank Code      Description    1    I10.      Essential (primary) hypertension  2    E78.5     Hyperlipidemia, unspecified    Are there any arthritis conditions recorded for Impairment Group, Etiologic  Diagnosis, or Comorbid Conditions that meet all of the regulatory requirements  for IRF classification (in 42 CFR 412.29(b)(2)(x), (xi), and xii))? No    PAI Bladder Accidents:  0 - Accidents.    Patient used medications/device this  shift.  11/19/2014 11:45:00 PM  Bladder Score = 6. Patient has not had an accident, but uses a  device/medication.  PAI Bowel Accident: 0 -Accidents.    Patient used medications/device this shift.   11/19/2014 11:45:00 PM  Bowel Score = 6. Patient has no accidents, but uses a device/medications.    MEDICAL NEEDS  Height on Admission: 74.4 inches.  Weight on Admission: 238 pounds.  Swallowing Status: Swallowing Status: Regular Food: solids and liquids swallowed  safely without supervision or modified food consistencies.    QUALITY INDICATORS  Pressure Ulcers: Unhealed Pressure Ulcer(s)  Present on Admission: No.  Pressure Ulcer Risk Condition on Admission: Peripheral Vascular Disease (PVD):  No.  Peripheral Arterial Disease (PAD): No.  Diabetes Mellitus (DM): No.    Signed by: Blenda Mounts, RN 11/21/2014 4:00:00 PM

## 2014-11-22 NOTE — Rehab Progress Note ([facility]) (Signed)
NAMETALEN POSER  MRN: [id number]  Account: [id number]  Session Start: 11/21/2014 3:30:00 PM  Session Stop: 11/21/2014 3:40:00 PM    Physical Therapy  Inpatient Rehabilitation Exception Note    Patient was unable to complete planned therapy session.  0 minutes of PT treatment this session count towards intensity and duration of  therapy requirement. Patient was not seen for the full scheduled time of PT  treatment this session secondary to:   Attempted to see pt for missed minutes. Pt states that his headache is better,  but he is not sure if physical activity will increase his headache. Pt sat at  EOB for 5 mins. He states that he would like to get back to bed and rest and  declined PT at this time.  Will attempt to see patient tomorrow,  as scheduled    Signed by: Blima Dessert, PT, DPT 11/21/2014 3:40:00 PM

## 2014-11-22 NOTE — Rehab Progress Note ([facility]) (Signed)
Blake [name]  MRN: [id number]  Account: [id number]  Session Start: 11/22/2014 12:00:00 AM  Session Stop: 11/22/2014 12:00:00 AM    Rehabilitation Nursing  Inpatient Rehabilitation Shift Assessment    Rehab Diagnosis: CVA (Right Brain)  Demographics:            Age: 63Y            Gender: Male  Primary Language: English    Date of Onset:  11/15/14  Date of Admission: 11/19/2014 6:50:00 PM    Rehabilitation Precautions Restrictions:   Fall    Patient Report: I am doing well  Pain: Patient currently without complaints of pain.  Pain Reassessment: Pain was not reassessed as no pain was reported.  Patient/Caregiver Goals:  to be able to go back to my routine    NEURO  Orientation/Awareness: Alert and Oriented x3.  Speech: Clear.  Behavior: Cooperative.    MEDICATIONS  IV Access: No IV access.  Dialysis Access: Patient does not have dialysis access.      Elopement Risk Level Assessment Tool  Patient Criteria: Patient is not capable of leaving the unit.  Assessment is not  applicable.      RISK ASSESSMENT FOR FALLS/INJURY    MENTAL STATUS CRITERIA:   0 - None identified.  MENTAL STATUS TOTAL: 0    AGE CRITERIA:   56 - < 78 years old  AGE TOTAL: 0    ELIMINATION CRITERIA:   3 - Toileting with Assistance.   ELIMINATION TOTAL: 3    HISTORY OF FALLS CRITERIA:   2 - Unknown History.  HISTORY OF FALLS TOTAL: 2    MEDICATIONS CRITERIA:   1 High Risk Medication (*see list below)   MEDICATIONS TOTAL: 1    PHYSICAL MOBILITY CRITERIA:   3 - Decreased balance reaction.   1 - Weakness/impaired physical mobility.   PHYSICAL MOBILITY TOTAL: 4    FALLS RISK ASSESSMENT TOTAL: 10    Patient's Fall Risk: TOTAL SCORE is <=10:  Low Risk.    Falls Interventions: Clutter removed and clear path to BR.  Call bell, phone, glasses, etc within reach.  Hourly toileting/safety checks between 6am and 10pm, then every 2 hours.    NUTRITION  Diet:  Type: Regular.  Food Consistency: Regular.  Liquid Consistency: Thin.      CARDIOVASCULAR      Bilateral lower extremities  Nail Bed Color: Pink.   No edema or redness present.  Pulses:   Apical Pulse: Regular. Strong. Rate is .   Patient does not have a pacemaker.   Patient does not have a defibrillator.    CARDIOPULMONARY  Lung Sounds:   Upper lobes. Clear.   Lower lobes. Clear.  Type of Respirations: Regular.  Cough: No cough noted.  Respiratory Support: The patient does not require any respiratory support.  Respiratory Equipment: None.    INTEGUMENTARY  Skin:  Temperature: Warm  Turgor: Normal for age  Moisture: Dry  Color of skin: Normal for Race/Ethnicity  Capillary Refill: Less than 3 seconds  Wounds/Incisions: No wounds or incisions.  Braden Scale for Predicting Pressure Sore Risk: Sensory Perception: No  impairment  Moisture: Rarely moist  Activity: Walks occasionally  Mobility: No limitation  Nutrition: Adequate  Friction and Shear: No apparent problem  Braden Score: 21  Level of Risk: No risk (19-23). Will reassess every shift.    GASTROINTESTINAL  Abdomen: Soft.  Bowel Sounds:  Active bowel sounds audible in all four quadrants.  Date of Last Bowel Movement:  12/15   No Problems/Complaints with Bowel Elimination Assessed.    GENITOURINARY  Current Bladder Pattern: Continent  Color:  Yellow   Patient denies problems with urination and/or catheter.    MUSCULOSKELETAL  Upper Body: left sided vision impairment, and left sided neglect  Lower Body: none    Functional Measures    EATING: Eating Score = 5. Patient is supervision/set-up for eating, requiring:  Opening containers.  Buttering bread.  Cutting meat.  Pouring liquids.  Patient requires the following assistive device(s) No assistive devices were  required.    BLADDER MANAGEMENT - LEVEL OF ASSIST: Bladder Score = 7. Patient is completely  independent for bladder management. There are no activity limitations.    BLADDER ACCIDENTS THIS SHIFT:  0 . Patient has not had an accident this  assessment and did not require medications or devices.    BOWEL  MANAGEMENT - LEVEL OF ASSIST: Bowel Score = 6.  Patient is modified  independent for bowel management.  Patient did not have bowel movement.  Medication/intervention was provided.    BOWEL ACCIDENTS THIS SHIFT: 0 . Patient has not had an accident, but used a  stool softener.    Education Provided:    Education Provided: Safety issues and interventions. Fall protocol.       Audience: Patient.       Mode: Explanation.       Response: Verbalized understanding.    Discharge: Patient is not being discharged at this time.    Long Term Goals:  1. Patient will demo increase self awareness on weak/ affected  side in order  to prevent neglect. - Goal Not Met  2 weeks from 11/19/14  Short Term Goals:  1. Pt will initiate use of call ligh at least 50-75% during  the day before getting OOB. - Goal Not Met   2. Pt will direct safe OOB transferes with Minimum Assistant   3. Family will be able to participate with ADLs, and toileting assistance at  least during the day.  7 days from 11/19/14    PROGRESS TOWARD GOALS: SHORT TERM GOAL REVIEW:       1. Pt will initiate use of call ligh at least 50-75% during the day before  getting OOB. - Not Met: in progress       2. Pt will direct safe OOB transferes with Minimum Assistant - Not Met: in  progress  Time frame to achieve short term goal(s):  7 days from 11/19/14    PLAN: Nursing Specific Interventions  self awareness on effected side in order to prevent neglect  Continue with the current Nursing Plan of Care.    TEAM CARE PLAN  Identified problems from team documentation:  Problem: Impaired Bladder Management    Problem: Impaired Bowel Management    Problem: Impaired Cognition  Cognition: Primary Team Goal: Patient will use strategies to compensate for lL  neglect in simple reading/writing/info management with no cues 75% of the  time./Active    Problem: Impaired Communication  Communication: Primary Team Goal: Pt will monitor for clarity, relevancy and  organization to convey mildy  complex information (e.g. give directions to his  home) with occasional reminder (< 2X in 10 minute conversation)/Active    Problem: Impaired Mobility  Mobility: Primary Team Goal: Patient will ambulate household distances (50')  independently and ascend/descend 12 steps with 1 HR with modified independence  to facilitate safe return to home environment./Active    Problem: Impaired Pain Management    Problem:  Impaired Self-care Mgmt/ADL/IADL  Self Care: Primary Team Goal: Pt will complete morning routine with supervision  provided by wife./Active    Problem: Safety Risk and Restraint  Safety: Primary Team Goal: self awareness of effected side in order to prevent  neglect/    Add/Update Problems from this assessment:  No updates at this time.    Please review Integrated Patient View Care Plan Flowsheet for Team identified  Problems, Interventions, and Goals.    Signed by: Eddie Dibbles, RN 11/22/2014 8:30:00 AM

## 2014-11-22 NOTE — Rehab Progress Note ([facility]) (Signed)
NAMEFLINT HAKEEM  MRN: [id number]  Account: [id number]  Session Start: 11/22/2014 12:00:00 AM  Session Stop: 11/22/2014 12:00:00 AM    SEVERE SEPSIS SCREEN  INFECTION:  Patient has no indication of infection.  Negative Sepsis Screen.  If you are unable to assess a system's dysfunction because you do not have labs,  or the labs you have are not current (within 24 hours), call physician and  request and order for the lab tests needed.    Signed by: Eddie Dibbles, RN 11/22/2014 8:00:00 AM

## 2014-11-22 NOTE — Rehab Progress Note ([facility]) (Signed)
Blake [name]  MRN: [id number]  Account: [id number]  Session Start: 11/22/2014 10:00:00 AM  Session Stop: 11/22/2014 11:00:00 AM    Physical Therapy  Inpatient Rehabilitation Progress Note - Brief    Rehab Diagnosis: CVA (Right Brain)  Demographics:            Age: 31Y            Gender: Male  Rehabilitation Precautions/Restrictions:   Fall    SUBJECTIVE  Patient Report: "I'm not having the headache that I had yesterday. I think I  just need to drink more water."  Pain: Patient currently without complaints of pain.    OBJECTIVE    Interventions:       Therapeutic Activities:  Pt. practiced ambulation in hospital, while  holding cup of water with supervision. Pt. required cues for direction. Pt.  reported having poor sense of spatial direction, since the stroke. Pt. practiced  negotiating 2 flights of stairs, while holding cup of water with handrail and  supervision. Pt. performed squats, D1/D2 bilateral UE patterns with 6 lb.  medicine ball, while squatting on foam, lunges on foam, lunges with trunk  rotation using 6 lb. medicine ball.  Pain Reassessment: Pain was not reassessed as no pain was reported.    Education Provided:    Education Provided: Direction/navigation during gait .       Audience: Patient.       Mode: Explanation.       Response: Needs reinforcement.    ASSESSMENT  Pt. demonstrating good balance with ambulation and functional mobility tasks,  but does have decreased visual attention to the left. Pt. has difficulty with  pathfinding, as well.    PLAN  Continued Physical Therapy is recommended.  Recommended Frequency/Duration/Intensity: 60-120 minutes/day, for 7-10 days from  initial evaluation  Activities Contributing Toward Care Plan: Therapeutic exercise, therapeutic  activities, visual adaptation techniques, balance training, coordination  training, gait and stair negotiation training, obstacle negotiation, and family  training and discharge planning all with an interdisciplinary approach  to care    3 Hour Rule Minutes: 60 minutes of PT treatment this session count towards  intensity and duration of therapy requirement. Patient was seen for the full  scheduled time of PT treatment this session.  Therapy Mode Minutes: Individual: 60 minutes.    Signed by: Chauncey Cruel, PT 11/22/2014 11:00:00 AM

## 2014-11-22 NOTE — Rehab IRF Data Coll Rights Priv Act ([facility]) (Signed)
NAMEJAMESEN [name]  MRN: [id number]  Account: [id number]  Session Start: 11/20/2014 12:00:00 AM  Session Stop: 11/20/2014 12:00:00 AM      IRFPPS Data Collection Rights and Rehab Privacy Act Notice: IRF-PAI Data  Collection Rights and Rehab Privacy Act Statement provided/given to patient and  family.    Signed by: Peggye Pitt, Rehab Tech 11/20/2014 8:00:00 AM

## 2014-11-23 ENCOUNTER — Inpatient Hospital Stay: Payer: BLUE CROSS/BLUE SHIELD

## 2014-11-23 MED ORDER — MAGNESIUM HYDROXIDE 400 MG/5ML PO SUSP
30.00 mL | Freq: Every evening | ORAL | Status: DC | PRN
Start: 2014-11-23 — End: 2014-11-27
  Administered 2014-11-25 – 2014-11-26 (×2): 30 mL via ORAL
  Filled 2014-11-23 (×2): qty 30

## 2014-11-23 MED ORDER — IOHEXOL 350 MG/ML IV SOLN
INTRAVENOUS | Status: AC
Start: 2014-11-23 — End: 2014-11-23
  Administered 2014-11-23: 100 mL via INTRAVENOUS
  Filled 2014-11-23: qty 100

## 2014-11-23 NOTE — Rehab Progress Note ([facility]) (Addendum)
Corrected 11/23/2014 9:43:49 AM    NAME: Blake [name]  MRN: [id number]  Account: [id number]  Session Start: 11/23/2014 11:00:00 AM  Session Stop: 11/23/2014 12:00:00 PM    Occupational Therapy  Inpatient Rehabilitation Progress Note - Brief    Rehab Diagnosis: CVA (Right Brain)  Demographics:            Age: 2Y            Gender: Male  Rehabilitation Precautions/Restrictions:   Fall    SUBJECTIVE  Patient Report: "I feel pretty good. I slept OK last night."  Pain: Patient currently without complaints of pain.    OBJECTIVE  General Observation: Received pt seated in w/c in room. Pt in NAD, AandOx4.  Session focused on visual assessment and identification of strengths/weaknesses  in preparation for functional activities; pt intermittently frustrated  throughout session 2/2 visual deficits    Interventions:       Therapeutic Activities:  Completed [place] Driving Battery assessment to  identify perceptual, attention, praxis, executive functioning, and comprehension  skills. Results:    Shape perception: Fail  Visual Spatial Skills: Fail  Visual Attention: Pass  Divided Attention: Fail  Praxis Skills: [employer] Functioning: Passed key search, action program, and sorting. Failed  rule shift.  Comprehension: Pass    Pt failed 5 subtests with a total battery score of 9. A score of 10 indicates  strong recommendation that pt will fail a formal driving test. Pt demonstrated  strong praxis and comrepehension skills. He was unable to complete the  perceptual analysis subtests due to increased difficulty. Results to be  discussed with pt.    Pt returned to room seated in w/c with call light in hand.  Pain Reassessment: Pain was not reassessed as no pain was reported.    Education Provided:    Education Provided: Stroke. Signs and Symptoms associated with stroke, Emotional  adjustments/depression,  Visual deficits       Audience: Patient.       Mode: Explanation.  Demonstration.       Response: Applied  knowledge.  Verbalized understanding.  Needs practice.    ASSESSMENT  Identified visual perceptual and attention deficits as well as strengths in  problem solving and praxis. Provided education to pt in order to increase safety  and I with ADLs and functional mobility through visual perceptual exercises and  compensatory strategies as appropriate. Continue OT POC.    PLAN  Continued Occupational Therapy is recommended.  Recommended Frequency/Duration/Intensity: 60-120 min/day,   5-6 days/wk, 7-10  days from eval  Continued Activities Contributing Toward Care Plan: Neuro re-ed,  visual/perceptual activities, ther ex, ther act, ADL retraining, AD/AE training,  DME recs, [place] planning, caregiver training/education    3 Hour Rule Minutes: 60 minutes of OT treatment this session count towards  intensity and duration of therapy requirement. Patient was seen for the full  scheduled time of OT treatment this session.  Therapy Mode Minutes: Individual: 60 minutes.    Signed by: Murlean Caller, OTR/L 11/22/2014 12:00:00 PM

## 2014-11-23 NOTE — Rehab Progress Note ([facility]) (Signed)
[name]  MRN: [id number]  Account: [id number]  Session Start: 11/23/2014 12:00:00 AM  Session Stop: 11/23/2014 12:00:00 AM    Rehabilitation Nursing  Inpatient Rehabilitation Shift Assessment    Rehab Diagnosis: CVA (Right Brain)  Demographics:            Age: 63Y            Gender: Male  Primary Language: English    Date of Onset:  11/15/14  Date of Admission: 11/19/2014 6:50:00 PM    Rehabilitation Precautions Restrictions:   Fall, left visual field cut/left neglect with issues of obstacle negotiation    Patient Report: I wish the noise would not happen today  Pain: Patient currently without complaints of pain.  Pain Reassessment: Pain was not reassessed as no pain was reported.  Patient/Caregiver Goals:  to be able to go back to my routine    NEURO  Orientation/Awareness: Alert and Oriented x4.  Speech: Clear.  Behavior: Cooperative.    MEDICATIONS  IV Access: No IV access.  Dialysis Access: Patient does not have dialysis access.      Elopement Risk Level Assessment Tool  Patient Criteria: Patient is not capable of leaving the unit.  Assessment is not  applicable.      RISK ASSESSMENT FOR FALLS/INJURY    MENTAL STATUS CRITERIA:   0 - None identified.  MENTAL STATUS TOTAL: 0    AGE CRITERIA:   63 - < 63 years old  AGE TOTAL: 0    ELIMINATION CRITERIA:   3 - Toileting with Assistance.   ELIMINATION TOTAL: 3    HISTORY OF FALLS CRITERIA:   2 - Unknown History.  HISTORY OF FALLS TOTAL: 2    MEDICATIONS CRITERIA:   2 or more High Risk Medications (*see list below)   MEDICATIONS TOTAL: 2    PHYSICAL MOBILITY CRITERIA:   1 - Weakness/impaired physical mobility.   PHYSICAL MOBILITY TOTAL: 1    FALLS RISK ASSESSMENT TOTAL: 8    Patient's Fall Risk: TOTAL SCORE is <=10:  Low Risk.    Falls Interventions: Clutter removed and clear path to BR.  Call bell, phone, glasses, etc within reach.  Hourly toileting/safety checks between 6am and 10pm, then every 2 hours.    NUTRITION  Diet:  Type: Regular.  Food  Consistency: Regular.  Liquid Consistency: Thin.      CARDIOVASCULAR     Bilateral lower extremities  Nail Bed Color: Pink.   No edema or redness present.  Pulses:   Apical Pulse: Regular. Strong. Rate is .   Patient does not have a pacemaker.   Patient does not have a defibrillator.    CARDIOPULMONARY  Lung Sounds:   Upper lobes. Clear.   Lower lobes. Clear.  Type of Respirations: Regular.  Cough: No cough noted.  Respiratory Support: The patient does not require any respiratory support.  Respiratory Equipment: None.    INTEGUMENTARY  Skin:  Temperature: Warm  Turgor: Normal for age  Moisture: Dry  Color of skin: Normal for Race/Ethnicity  Capillary Refill: Less than 3 seconds  Wounds/Incisions: No wounds or incisions.  Braden Scale for Predicting Pressure Sore Risk: Sensory Perception: No  impairment  Moisture: Rarely moist  Activity: Walks occasionally  Mobility: Slightly limited  Nutrition: Adequate  Friction and Shear: No apparent problem  Braden Score: 20  Level of Risk: No risk (19-23). Will reassess every shift.    GASTROINTESTINAL  Abdomen: Soft.  Bowel Sounds:  Active bowel sounds audible in  all four quadrants.  Date of Last Bowel Movement:  12/15   No Problems/Complaints with Bowel Elimination Assessed.    GENITOURINARY  Current Bladder Pattern: Continent  Color:  Yellow   Patient denies problems with urination and/or catheter.    MUSCULOSKELETAL  Upper Body: left sided vision impairment, and left sided neglect  Lower Body: none    Functional Measures    EATING: Eating Score = 7. Patient is completely independent for eating.  There  are no activity limitations.    BLADDER MANAGEMENT - LEVEL OF ASSIST: Bladder Score = 7. Patient is completely  independent for bladder management. There are no activity limitations.    BLADDER ACCIDENTS THIS SHIFT:  0 . Patient has not had an accident this  assessment and did not require medications or devices.    BOWEL MANAGEMENT - LEVEL OF ASSIST: Bowel Score = 6.  Patient  is modified  independent for bowel management.  Patient did not have bowel movement.  Medication/intervention was provided.    BOWEL ACCIDENTS THIS SHIFT: 0 . Patient has not had an accident, but used a  stool softener.    Education Provided:    Education Provided: Safety issues and interventions. Fall protocol.       Audience: Patient.       Mode: Explanation.       Response: Verbalized understanding.    Discharge: Patient is not being discharged at this time.    Long Term Goals:  1. Patient will demo increase self awareness on weak/ affected  side in order  to prevent neglect. - Goal Not Met  2 weeks from 11/19/14  Short Term Goals:  1. Pt will initiate use of call ligh at least 50-75% during  the day before getting OOB. - Goal Not Met   2. Pt will direct safe OOB transferes with Minimum Assistant - Goal Not Met   3. Family will be able to participate with ADLs, and toileting assistance at  least during the day.  7 days from 11/19/14    PROGRESS TOWARD GOALS:    PLAN: Nursing Specific Interventions  self awareness on effected side in order to prevent neglect  Continue with the current Nursing Plan of Care.    TEAM CARE PLAN  Identified problems from team documentation:  Problem: Impaired Bladder Management    Problem: Impaired Bowel Management    Problem: Impaired Cognition  Cognition: Primary Team Goal: Patient will use strategies to compensate for lL  neglect in simple reading/writing/info management with no cues 75% of the  time./Active    Problem: Impaired Communication  Communication: Primary Team Goal: Pt will monitor for clarity, relevancy and  organization to convey mildy complex information (e.g. give directions to his  home) with occasional reminder (< 2X in 10 minute conversation)/Active    Problem: Impaired Leisure Skills  Leisure Skills: Primary Team Goal: Pt will demonstrate appropriate safety  awareness, mobility, and cognition in order to participate in a leisure activity  of interest at mod I  level at discharge/Active    Problem: Impaired Mobility  Mobility: Primary Team Goal: Patient will ambulate household distances (50')  independently and ascend/descend 12 steps with 1 HR with modified independence  to facilitate safe return to home environment./Active    Problem: Impaired Pain Management    Problem: Impaired Self-care Mgmt/ADL/IADL  Self Care: Primary Team Goal: Pt will complete morning routine with supervision  provided by wife./Active    Problem: Safety Risk and Restraint  Safety: Primary Team  Goal: self awareness of effected side in order to prevent  neglect/    Add/Update Problems from this assessment:  No updates at this time.    Please review Integrated Patient View Care Plan Flowsheet for Team identified  Problems, Interventions, and Goals.    Signed by: Eddie Dibbles, RN 11/23/2014 9:00:00 AM

## 2014-11-23 NOTE — Progress Notes (Signed)
IRF Physiatry Attending Face to Face Progress Note   Functional Status/Update:   I reviewed patient's therapy notes to assess functional status and ongoing need for therapies. Of note, tolerating therapy without issue.    Subjective:   Feels well and is without complaints. No pain. No headache. Sleeping well. No constipation. No chest pain. No shortness of breath. Does report some annoyance with ongoing construction.    Objective:   Filed Vitals:    11/22/14 0843 11/22/14 1936 11/23/14 0544 11/23/14 0937   BP: 133/88 156/75 136/69 158/93   Pulse:  58 55 66   Temp:  97.7 F (36.5 C) 97 F (36.1 C)    TempSrc:       Resp:  18 18    Height:       Weight:       SpO2:  94% 98%        Physical Examination:   Appears well.  In no acute distress. Overweight.   Conjunctivae non-injected. EOMI.   Symmetric facies. Hearing grossly intact. Moist mucous membranes.   +S1S2 Heart rate and rhythm are regular. No significant lower limb edema.   No iwob   Soft. Non-tender. Normoactive bowel sounds.   Alert Pleasant and cooperative     New Labs:  Results     ** No results found for the last 24 hours. **          Current medications:   Scheduled Meds:  Current Facility-Administered Medications   Medication Dose Route Frequency   . aspirin  324 mg Oral Daily   . docusate sodium  100 mg Oral BID   . enoxaparin  30 mg Subcutaneous Q12H SCH   . famotidine  20 mg Oral Q12H SCH   . losartan  50 mg Oral Daily   . rosuvastatin  20 mg Oral QHS     PRN Meds:.meclizine, oxyCODONE      Assessment: 63 y.o. male with h/o HLD, HTN now s/p R PCA CVA.  Plan:   REHAB: Continue comprehensive and intensive inpatient rehab program, including:   Physical therapy 60-120 min daily, 5-6 times per week, Occupational therapy 60-120 min daily, 5-6 times per week, Speech therapy 60-120 min daily, 5-6 times per week, Case management and Rehabilitation nursing    Will continue to address the following impairments and issues:  Mobility, ADLs, Aphasia, Cognitive  impairments, Impaired ROM, Impaired endurance, Adherence to precautions, Caregiver training, Medication management, Adjustment to disability, Community support and resources, Impaired coordination, Impaired balance and Coping strategies    R PCA stroke: rehab above for ADL dysfunction and retraining.Rehab psychology following. Bp goal 130-150. Neuro to plan on doing CTA for better visualization of carotid stenosis.  VTE prophy: full dose aspirin  HTN: on losartan, holding amlodipine for above goal  HLD: started on rosuvastatin this hospitalization  Dispo: [facility] 1 week,  F/U: with neurology, outpatient  I have discussed the plan of care with the resident physician and examined patient independently.  I have reviewed the note above and concur with findings and plan.  Additional comments are:  Observed in tx.  Doing well.  Discussed with the pt re: status and tx.  Discussed with ICM.   IOPC completed.

## 2014-11-23 NOTE — Progress Notes (Signed)
Blake [name]  MRN: [id number]  Account: [id number]  Session Start: 11/23/2014 12:00:00 AM  Session Stop: 11/23/2014 12:00:00 AM    Physical Medicine and Rehabilitation  Initial Individualized Interdisciplinary Plan Of Care    Rehab Diagnosis: CVA (Right Brain)  Demographics:            Age: 62Y            Gender: Male    Plan Of Care  Team Members Attending : Dr. Delma Officer MD, Antonietta Breach CM, [facility] RN,  Malachy Mood PT, [place]our OT, Ladell Heads SLP  Anticipated Discharge Date/Estimated Length of Stay: 1-2wks  Anticipated Discharge Destination: Community discharge with assistance  Discharge Plan : home  Medical Necessity Expected Level Rationale: daily MD eandm for VS, Neuro eval.....  Intensity and Duration: an average of 3 hours/5 days per week  Medical Supervision and 24 Hour Rehab Nursing: x  Physical Therapy: x  PT Intensity/Duration: 60-197min/day5-6days/wk  Occupational Therapy: x  OT Intensity/Duration: 60-129min/day5-6days/wk  Speech and Language Therapy: x  SLP Intensity/Duration: 60-134min/day5-6days/wk  Therapeutic Recreation: x  Psychology: x  Registered Dietician: x  Other: evals in progress    The following is a list of patient problems that have been identified by the  interdisciplinary team:    Problem: Impaired Bladder Management  Bladder Management Status Update: Pt is continent of bladder.    Problem: Impaired Bowel Management  Bowel Management Status Update: Pt is continent of bowel.    Problem: Impaired Cognition  Cognition Status Update: eval completed  Team Identified Barrier to Discharge: Yes  Interventions:  Decrease environmental stimuli: Active  Safety awareness training: Active  Compensatory strategies: Active  Cognition: Primary Team Goal/Status: Patient will use strategies to compensate  for lL neglect in simple reading/writing/info management with no cues 75% of the  time. / Active    Problem: Impaired Communication  Communication Status Update: eval  completed  Team Identified Barrier to Discharge: No  Interventions:  Compensatory strategies: Active  Communication device: Active  Use simple brief communication techniques: Active  Communication: Primary Team Goal/Status: Pt will monitor for clarity, relevancy  and organization to convey mildy complex information (e.g. give directions to  his home) with occasional reminder (< 2X in 10 minute conversation) / Active    Problem: Impaired Leisure Skills  Team Identified Barrier to Discharge: No  Interventions:  Provide leisure activity alternatives within functional limitations: Active  Identify community resources: Active  Other Leisure Intervention 1 - Text: Activities emphasizing L attention, safety  awareness, mobility- balance and coordination  Other Leisure Intervention 1 - Status: Active  Leisure Skills: Primary Team Goal/Status: Pt will demonstrate appropriate safety  awareness, mobility, and cognition in order to participate in a leisure activity  of interest at mod I level at discharge / Active    Problem: Impaired Mobility  Team Identified Barrier to Discharge: Yes  Interventions:  Transfer training: Active  Gait training: Active  Mobility: Primary Team Goal/Status: Patient will ambulate household distances  (50') independently and ascend/descend 12 steps with 1 HR with modified  independence to facilitate safe return to home environment. / Active    Problem: Impaired Pain Management  Pain Management Status Update: No voiced c/o pain.    Problem: Impaired Self-care Mgmt/ADL/IADL  Self Care/ADL/IADL Status Update: Pt is independent for eating, requires set-up  for grooming, set-up for UE dressing, minimal assist for bathing, minimal assist  for LE dressing, supervision for gait level transfers to/from toilet with grab  bars  and shower bench with minimal assist to steady  Team Identified Barrier to Discharge: Yes  Interventions:  Adaptive equipment training: Active  Compensatory strategies: Active  Encourage  patient to participate in activities of daily living: Active  Self Care: Primary Team Goal/Status: Pt will complete morning routine with  supervision provided by wife. / Active    Problem: Safety Risk and Restraint  Safety Risk Status Update: Pt has left visual field disburbance and is at risk  for fallls. Instructed to call prior to getting OOB.  Team Identified Barrier to Discharge: No  Safety: Primary Team Goal/Status: self awareness of effected side in order to  prevent neglect /    Comments: 11-23-14   5:09pm    Signed by: Candie Echevaria, MD 11/23/2014 5:08:00 PM    Physician CoSigned By: Candie Echevaria 11/23/2014 17:08:37

## 2014-11-23 NOTE — Rehab Progress Note ([facility]) (Signed)
Blake [name]  MRN: [id number]  Account: [id number]  Session Start: 11/23/2014 10:00:00 AM  Session Stop: 11/23/2014 11:00:00 AM    Physical Therapy  Inpatient Rehabilitation Progress Note - Brief    Rehab Diagnosis: CVA (Right Brain)  Demographics:            Age: 76Y            Gender: Male  Rehabilitation Precautions/Restrictions:   Fall, left visual field cut, patient is able to compensate within room but  struggles in unfamiliar environments and busy, distracting hallways    SUBJECTIVE  Patient Report: "I have been getting up in my room by myself for days. I don't  think my balance or vision is an issue but I guess that's for you to decide"  Pain: Patient currently without complaints of pain.    OBJECTIVE  Inpatient Rehabilitation Facility - [facility]    Functional Measures      TRANSFERS BED/CHAIR/WHEELCHAIR: Bed/chair/wheelchair Transfer Score = 6.  Patient is modified independent for transferring to and from the  bed/chair/wheelchair, requiring: More than reasonable time.   Compensation techniques including scanning due to left visual field cut    LOCOMOTION WHEELCHAIR:   Wheelchair did not occur. Patient fully ambulatory    LOCOMOTION WALK:   Walk Distance Scale = 3.  Distance walked is greater than 150 feet. Walk Score  = 5.  Patient requires supervision or set up for walking. Patient walked a  distance of  1000 feet. No assistive devices were required.  Patient is  independent to ambulate within room as this is a familiar environment for him  and he is able to use scanning techniques to effectively negotiate obstacles;  patient requires supervision in more unpredictable and distracting environments  outside of room    LOCOMOTION STAIRS: Stairs Score = 5.  Patient requires supervision or set up for  negotiating stairs. 20 stairs.  Patient requires the following assistive device(s):  Handrail(s).  Interventions:       Therapeutic Activities:  Patient found standing in bathroom performing self  care  activities without supervision, even though patient has been instructed to  have someone standing by. Educated that at this point, therapists had not  released him to walk around the room on his own, and patient was very resistant  to not being able to get up on his own, feeling that he has compensated enough  for his left visual field cut to navigate around his room safely. Therapist  assessed and watched patient get out of bed, ambulate around the bed, open the  door to the bathroom, walk inside and get on toilet, come back out of bathroom  safely without running into obstacles and using proper scanning techniques.  Patient is now released to ambulate within room independently without AD but  must have supervision when showering due to wet environment and balance  challenges.  Patient participated in large obstacle negotiation; practiced using scanning and  touching techniques to navigate around several chairs, poles, and other large  objects placed in his path without running into any of them.  Several fruits and vegetables were placed around a very busy, unfamiliar room to  patient and he was instructed to scan around the room and retrieve these  objects, sometimes requiring him to step in very small spaces and bend over or  reach high. Patient was able to find all objects without assistance and without  LOB.  Patient practiced ascending,/descending 20 steps with 1 HR and  reciprocal  pattern with supervision required.  Pain Reassessment: Pain was not reassessed as no pain was reported.      ASSESSMENT  Patient has adapted well to visual deficits within familiar environment of room  and is able to navigate safely without running into objects. In more unfamiliar  or busy environment when other people will be unexpectedly walking around him,  patient continues to require supervision and cueing for complete scanning  techniques. Spoke with patient about going on an outing to the grocery store to  challenge his  visuospatial and cognitive systems; patient is agreeable to  participate.    PLAN  Continued Physical Therapy is recommended.  Recommended Frequency/Duration/Intensity: 60-120 minutes/day, for 7-10 days from  initial evaluation  Activities Contributing Toward Care Plan: Therapeutic exercise, therapeutic  activities, visual adaptation techniques, balance training, coordination  training, gait and stair negotiation training, obstacle negotiation, and family  training and discharge planning all with an interdisciplinary approach to care    3 Hour Rule Minutes: 60 minutes of PT treatment this session count towards  intensity and duration of therapy requirement. Patient was seen for the full  scheduled time of PT treatment this session.  Therapy Mode Minutes: Individual: 60 minutes.    Signed by: Malachy Mood, PT 11/23/2014 11:00:00 AM

## 2014-11-23 NOTE — Rehab Progress Note ([facility]) (Signed)
Blake [name]  MRN: [id number]  Account: [id number]  Session Start: 11/22/2014 12:00:00 AM  Session Stop: 11/22/2014 12:00:00 AM    Rehabilitation Nursing  Inpatient Rehabilitation Shift Assessment    Rehab Diagnosis: CVA (Right Brain)  Demographics:            Age: 63Y            Gender: Male  Primary Language: English    Date of Onset:  11/15/14  Date of Admission: 11/19/2014 6:50:00 PM    Rehabilitation Precautions Restrictions:   Fall, left visual field cut/left neglect with issues of obstacle negotiation    Patient Report: no pain  Pain: Patient currently without complaints of pain.  Pain Reassessment: Pain was not reassessed as no pain was reported.  Patient/Caregiver Goals:  to be able to go back to my routine    NEURO  Orientation/Awareness: Alert and Oriented x3.  Speech: Clear.  Behavior: Cooperative.    MEDICATIONS  IV Access: No IV access.  Dialysis Access: Patient does not have dialysis access.      Elopement Risk Level Assessment Tool  Patient Criteria: Patient is not capable of leaving the unit.  Assessment is not  applicable.      RISK ASSESSMENT FOR FALLS/INJURY    MENTAL STATUS CRITERIA:   0 - None identified.  MENTAL STATUS TOTAL: 0    AGE CRITERIA:   5 - < 39 years old  AGE TOTAL: 0    ELIMINATION CRITERIA:   3 - Toileting with Assistance.   ELIMINATION TOTAL: 3    HISTORY OF FALLS CRITERIA:   2 - Unknown History.  HISTORY OF FALLS TOTAL: 2    MEDICATIONS CRITERIA:   2 or more High Risk Medications (*see list below)   MEDICATIONS TOTAL: 2    PHYSICAL MOBILITY CRITERIA:   3 - Decreased balance reaction.   1 - Weakness/impaired physical mobility.   PHYSICAL MOBILITY TOTAL: 4    FALLS RISK ASSESSMENT TOTAL: 11    Patient's Fall Risk: TOTAL SCORE >10: High Risk    Falls Interventions: Clutter removed and clear path to BR.  Call bell, phone, glasses, etc within reach.  Hourly toileting/safety checks between 6am and 10pm, then every 2 hours.    NUTRITION  Diet:  Type: Regular.  Food Consistency:  Regular.  Liquid Consistency: Thin.      CARDIOVASCULAR     Bilateral lower extremities  Nail Bed Color: Pink.   No edema or redness present.  Pulses:   Apical Pulse: Regular. Strong. Rate is 89 .   Patient does not have a pacemaker.   Patient does not have a defibrillator.    CARDIOPULMONARY  Lung Sounds:   Upper lobes. Clear.   Upper lobes. Clear.  Type of Respirations: Regular.  Cough: No cough noted.  Respiratory Support: The patient does not require any respiratory support.  Respiratory Equipment: None. 94% room air    INTEGUMENTARY  Skin:  Temperature: Warm  Turgor: Normal for age  Moisture: Dry  Color of skin: Normal for Race/Ethnicity  Capillary Refill: Less than 3 seconds  Wounds/Incisions: No wounds or incisions.  Braden Scale for Predicting Pressure Sore Risk: Sensory Perception: No  impairment  Moisture: Rarely moist  Activity: Walks occasionally  Mobility: Slightly limited  Nutrition: Adequate  Friction and Shear: No apparent problem  Braden Score: 20  Level of Risk: No risk (19-23). Will reassess every shift.    GASTROINTESTINAL  Abdomen: Soft. Nontender.  Bowel Sounds:  Active  bowel sounds audible in all four quadrants.  Date of Last Bowel Movement:  11/20/14   No Problems/Complaints with Bowel Elimination Assessed.    GENITOURINARY  Current Bladder Pattern: Continent  Color:  Yellow   Patient denies problems with urination and/or catheter.    MUSCULOSKELETAL  Upper Body: left sided vision impairment, and left sided neglect  Lower Body: none    Functional Measures      BLADDER MANAGEMENT - LEVEL OF ASSIST: Bladder Score = 7. Patient is completely  independent for bladder management. There are no activity limitations.    BLADDER ACCIDENTS THIS SHIFT:  0 . Patient has not had an accident this  assessment and did not require medications or devices.    BOWEL MANAGEMENT - LEVEL OF ASSIST: Bowel Score = 6.  Patient is modified  independent for bowel management.  Patient did not have bowel  movement.  Medication/intervention was provided.    BOWEL ACCIDENTS THIS SHIFT: 0 . Patient has not had an accident and did not  require medications or devices.    Education Provided:    Education Provided: Safety issues and interventions. Fall protocol.       Audience: Patient.       Mode: Explanation.       Response: Verbalized understanding.    Discharge: Patient is not being discharged at this time.    Long Term Goals:  1. Patient will demo increase self awareness on weak/ affected  side in order  to prevent neglect. - Goal Not Met  2 weeks from 11/19/14  Short Term Goals:  1. Pt will initiate use of call ligh at least 50-75% during  the day before getting OOB. - Goal Not Met   2. Pt will direct safe OOB transferes with Minimum Assistant - Goal Not Met   3. Family will be able to participate with ADLs, and toileting assistance at  least during the day.  7 days from 11/19/14    PROGRESS TOWARD GOALS: Pt's wife assisted with taking pt to the bathroom.    PLAN: Nursing Specific Interventions  self awareness on effected side in order to prevent neglect  Continue with the current Nursing Plan of Care.    TEAM CARE PLAN  Identified problems from team documentation:  Problem: Impaired Bladder Management    Problem: Impaired Bowel Management    Problem: Impaired Cognition  Cognition: Primary Team Goal: Patient will use strategies to compensate for lL  neglect in simple reading/writing/info management with no cues 75% of the  time./Active    Problem: Impaired Communication  Communication: Primary Team Goal: Pt will monitor for clarity, relevancy and  organization to convey mildy complex information (e.g. give directions to his  home) with occasional reminder (< 2X in 10 minute conversation)/Active    Problem: Impaired Leisure Skills  Leisure Skills: Primary Team Goal: Pt will demonstrate appropriate safety  awareness, mobility, and cognition in order to participate in a leisure activity  of interest at mod I level at  discharge/Active    Problem: Impaired Mobility  Mobility: Primary Team Goal: Patient will ambulate household distances (50')  independently and ascend/descend 12 steps with 1 HR with modified independence  to facilitate safe return to home environment./Active    Problem: Impaired Pain Management    Problem: Impaired Self-care Mgmt/ADL/IADL  Self Care: Primary Team Goal: Pt will complete morning routine with supervision  provided by wife./Active    Problem: Safety Risk and Restraint  Safety: Primary Team Goal: self awareness of  effected side in order to prevent  neglect/    Add/Update Problems from this assessment:  No updates at this time.    Please review Integrated Patient View Care Plan Flowsheet for Team identified  Problems, Interventions, and Goals.    Signed by: Sherlyn Lick, RN 11/22/2014 9:00:00 PM

## 2014-11-23 NOTE — Rehab Progress Note ([facility]) (Addendum)
Corrected 11/25/2014 8:38:54 AM    NAME: Blake [name]  MRN: [id number]  Account: [id number]  Session Start: 11/23/2014 8:10:00 AM  Session Stop: 11/23/2014 9:30:00 AM    Speech Language Pathology  Inpatient Rehabilitation Progress Note - Brief    Rehab Diagnosis: CVA (Right Brain)  Demographics:            Age: 19Y            Gender: Male  Rehabilitation Precautions/Restrictions:   Fall, left visual field cut/left neglect with issues of obstacle negotiation    SUBJECTIVE  Patient Report: "I couldn't remember if I did that line."  Pain: Patient currently without complaints of pain.    OBJECTIVE  General Observations: Pt resting in bed, agreeable to therapy.    Interventions:       Speech Treatment: Targeted pragmatics for clear, concise communication.  Verbose but generally maintained topic; needed verbal clarification of message x  1 due to lengthy examples and description confusing listener.    Visual scanning:  structured line scanning (cancellation task): error x 1 but I'ly corrected on  double check  functional reading (advertisement): error x 1 (adding extra number when reading  item code); able to correct with verbal prompt      ASSESSMENT  With minimal cueing from SLP, pt able to use visual scanning strategies in  functional reading with minimal errors.        PLAN  Continued Speech Language Pathology is recommended to address:  Recommended Frequency/Duration/Intensity: 1:1 or group 60-120 minutes daily for  5-6 days week for 1-2 weeks duration then d/c to home  Continued Activities Contributing Toward Care Plan: Strategy training with  opportunity for practice along a hierarchy of difficulty, patient and family  education, collaboration with team including neuropsych.    3 Hour Rule Minutes: 80 minutes of SLP treatment this session count towards  intensity and duration of therapy requirement. Patient was seen for the full  scheduled time of SLP treatment this session.  Therapy Mode Minutes: Individual: 80  minutes.    Signed by: Ladell Heads, M.A., CCC-SLP 11/23/2014 9:30:00 AM

## 2014-11-23 NOTE — Rehab Progress Note ([facility]) (Signed)
NAMESUMIT BRANHAM  MRN: [id number]  Account: [id number]  Session Start: 11/23/2014 12:00:00 AM  Session Stop: 11/23/2014 12:00:00 AM    SEVERE SEPSIS SCREEN  INFECTION:  Patient has no indication of infection.  Negative Sepsis Screen.  If you are unable to assess a system's dysfunction because you do not have labs,  or the labs you have are not current (within 24 hours), call physician and  request and order for the lab tests needed.    Signed by: Parks Neptune, RN 11/23/2014 8:20:00 PM

## 2014-11-23 NOTE — Consults (Signed)
NEUROLOGY CONSULATION    Date Time: 11/23/2014 4:46 PM  Patient Name: Blake Sheer, MD  Attending Physician: Eusebio Friendly, MD      Assessment & Plan:   Right PCA distribution stroke.  Lumbar right thalamic area stroke.  Left carotid stenosis.  Left intracranial  aneurysm.   Proceed with CT angiogram to better assess the degree of carotid stenosis and the aneurysm.   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  He was found to have intracranial stenosis and a right PCA distribution stroke with involvement of the occipital lobe, and the right thalamic area    Is currently on rehabilitation, and I have been asked to evaluate him regarding his carotid stenosis, and the intracranial aneurysm.  CT angiogram or angiogram was not done during the last hospitalization because of kidney dysfunction.    Past Medical History:     Past Medical History   Diagnosis Date   . Hypertension        Meds:      Scheduled Meds: PRN Meds:      aspirin 324 mg Oral Daily   docusate sodium 100 mg Oral BID   enoxaparin 30 mg Subcutaneous Q12H SCH   famotidine 20 mg Oral Q12H SCH   losartan 50 mg Oral Daily   rosuvastatin 20 mg Oral QHS       Continuous Infusions:      meclizine 25 mg Q6H PRN   oxyCODONE 5 mg Q3H PRN         I personally reviewed all of the medications    No Known Allergies    Social & Family History:     History     Social History   . Marital Status: Married     Spouse Name: N/A     Number of Children: N/A   . Years of Education: N/A     Occupational History   . Not on file.     Social History Main Topics   . Smoking status: Never Smoker    . Smokeless tobacco: Not on file   . Alcohol Use: No   . Drug Use: No   . Sexual Activity: Not on file     Other Topics Concern   . Not on file     Social History Narrative   . No narrative on  file     No family history on file.    Review of Systems:   No headache, eye, ear nose, throat problems; no coughing or wheezing or shortness of breath, No chest pain or orthopnea, no abdominal pain, nausea or vomiting, No pain in the body or extremities, no psychiatric, neurological, endocrine, hematological or cardiac complaints except as noted above.         Physical Exam:   Blood pressure 158/93, pulse 66, temperature 97 F (36.1 C), temperature source Oral, resp. rate 18, height 1.88 m (6\' 2" ), weight 107.956 kg (238 lb), SpO2 98 %.    HEENT: Normocephalic. Non-icter, no congestion, no carotid bruits  Lungs:  CTA bil  Cardiac:  S1,S2, normal rate and rhythm  Neck: supple, no lymphadenopathy, no thyromegaly, no JVD, no cartoid bruits  Extremities: no clubbing, cyanosis, or edema  Skin: no rashes or lesions noted    Neuro:  Level of consciousness:  Alert and  appropriate  Oriented:  X 3  Cognition:  Intact naming, recognition, concentration and following  commands  Cranial Nerves: Dense left-sided visual field deficit noted.    Strength:  No upper extremity drift, 5/5 strength x 4 extremities  Coordination:  Intact FTN testing      Labs:     Recent Labs  Lab 11/19/14  0352   GLUCOSE 100   BUN 22   CREATININE 1.1   CALCIUM 9.5   SODIUM 140   POTASSIUM 4.0   CHLORIDE 108   CO2 24     No results for input(s): WBC, HGB, HCT, MCV, MCH, MCHC, PLT in the last 168 hours.      No results for input(s): PTT, PT, INR in the last 72 hours.       Radiology Results (24 Hour)     ** No results found for the last 24 hours. **           All recent brain and spine imaging (MRI, CT) personally reviewed.    Chart reviewed    Case discussed with: pt       Signed by: Cathe Mons, MD  Spectralink: [phone]      Answering Service: [phone]

## 2014-11-23 NOTE — Rehab Progress Note ([facility]) (Signed)
Blake [name]  MRN: [id number]  Account: [id number]  Session Start: 11/23/2014 12:00:00 AM  Session Stop: 11/23/2014 12:00:00 AM    Rehabilitation Nursing  Inpatient Rehabilitation Shift Assessment    Rehab Diagnosis: CVA (Right Brain)  Demographics:            Age: 63Y            Gender: Male  Primary Language: English    Date of Onset:  11/15/14  Date of Admission: 11/19/2014 6:50:00 PM    Rehabilitation Precautions Restrictions:   Fall, left visual field cut/left neglect with issues of obstacle negotiation    Patient Report: "Am oing wll"  Pain: Patient currently without complaints of pain.  Pain Reassessment: Pain was not reassessed as no pain was reported.  Patient/Caregiver Goals:  to be able to go back to my routine    NEURO  Orientation/Awareness: Alert and Oriented x4.  Speech: Clear.  Behavior: Cooperative.    MEDICATIONS  IV Access: No IV access.  Dialysis Access: Patient does not have dialysis access.      Elopement Risk Level Assessment Tool  Patient Criteria: Patient is not capable of leaving the unit.  Assessment is not  applicable.      RISK ASSESSMENT FOR FALLS/INJURY    MENTAL STATUS CRITERIA:   0 - None identified.  MENTAL STATUS TOTAL: 0    AGE CRITERIA:   50 - < 66 years old  AGE TOTAL: 0    ELIMINATION CRITERIA:   3 - Toileting with Assistance.   ELIMINATION TOTAL: 3    HISTORY OF FALLS CRITERIA:   2 - Unknown History.  HISTORY OF FALLS TOTAL: 2    MEDICATIONS CRITERIA:   2 or more High Risk Medications (*see list below)   MEDICATIONS TOTAL: 2    PHYSICAL MOBILITY CRITERIA:   1 - Weakness/impaired physical mobility.   3 - Decreased balance reaction.   PHYSICAL MOBILITY TOTAL: 4    FALLS RISK ASSESSMENT TOTAL: 11    Patient's Fall Risk: TOTAL SCORE >10: High Risk    Falls Interventions: Clutter removed and clear path to BR.  Call bell, phone, glasses, etc within reach.  Yellow "high risk" patient identification in place: wrist band, socks, chart  sticker, door sign.  Assistive devices at  [facility].  Reoriented PRN.  Low bed with mat    NUTRITION  Diet:  Type: Cardiac.  Food Consistency: Regular.  Liquid Consistency: Thin.      CARDIOVASCULAR     Bilateral lower extremities  Nail Bed Color: Pink.   No edema or redness present.  Pulses:   Apical Pulse: Regular. Strong. Rate is .   Patient does not have a pacemaker.   Patient does not have a defibrillator.    CARDIOPULMONARY  Lung Sounds:   Upper lobes. Clear.   Lower lobes. Clear.  Type of Respirations: Regular.  Cough: No cough noted.  Respiratory Support: The patient does not require any respiratory support.  Respiratory Equipment: None.    INTEGUMENTARY  Skin:  Temperature: Cool  Turgor: Elastic  Moisture: Dry  Color of skin: Normal for Race/Ethnicity  Capillary Refill: Less than 3 seconds  Wounds/Incisions: No wounds or incisions.  Braden Scale for Predicting Pressure Sore Risk: Sensory Perception: No  impairment  Moisture: Rarely moist  Activity: Chairfast  Mobility: Slightly limited  Nutrition: Adequate  Friction and Shear: No apparent problem  Braden Score: 19  Level of Risk: No risk (19-23). Will reassess every shift.  GASTROINTESTINAL  Abdomen: Soft. Nontender.  Bowel Sounds:  Active bowel sounds audible in all four quadrants.  Date of Last Bowel Movement:  11/20/14   No Problems/Complaints with Bowel Elimination Assessed.    GENITOURINARY  Current Bladder Pattern: Continent  Color:  Yellow   Patient denies problems with urination and/or catheter.    MUSCULOSKELETAL  Upper Body: left sided vision impairment, and left sided neglect  Lower Body: none    Functional Measures      TOILETING: Toileting Score = 7.  Patient is completely independent for  toileting. There are no activity limitations.    BLADDER MANAGEMENT - LEVEL OF ASSIST: Bladder Score = 7. Patient is completely  independent for bladder management. There are no activity limitations.    BLADDER ACCIDENTS THIS SHIFT:  0 . Patient has not had an accident this  assessment and did not require  medications or devices.    BOWEL MANAGEMENT - LEVEL OF ASSIST: Bowel Score = 6. Patient is modified  independent for bowel management requiring: Stool softeners    BOWEL ACCIDENTS THIS SHIFT: 0 . Patient has not had an accident, but used a  non-natural laxative.    Education Provided:    Education Provided: Safety issues and interventions.       Audience: Patient.       Mode: Explanation.       Response: Applied knowledge.    Discharge: Patient is not being discharged at this time.    Long Term Goals:  1. Patient will demo increase self awareness on weak/ affected  side in order  to prevent neglect. - Goal Not Met  2 weeks from 11/19/14  Short Term Goals:  1. Pt will initiate use of call ligh at least 50-75% during  the day before getting OOB. - Goal Not Met   2. Pt will direct safe OOB transferes with Minimum Assistant - Goal Not Met   3. Family will be able to participate with ADLs, and toileting assistance at  least during the day.  7 days from 11/19/14    PROGRESS TOWARD GOALS:    PLAN: Nursing Specific Interventions  self awareness on effected side in order to prevent neglect  Continue with the current Nursing Plan of Care.    TEAM CARE PLAN  Identified problems from team documentation:  Problem: Impaired Bladder Management    Problem: Impaired Bowel Management    Problem: Impaired Cognition  Cognition: Primary Team Goal: Patient will use strategies to compensate for lL  neglect in simple reading/writing/info management with no cues 75% of the  time./Active    Problem: Impaired Communication  Communication: Primary Team Goal: Pt will monitor for clarity, relevancy and  organization to convey mildy complex information (e.g. give directions to his  home) with occasional reminder (< 2X in 10 minute conversation)/Active    Problem: Impaired Leisure Skills  Leisure Skills: Primary Team Goal: Pt will demonstrate appropriate safety  awareness, mobility, and cognition in order to participate in a leisure activity  of  interest at mod I level at discharge/Active    Problem: Impaired Mobility  Mobility: Primary Team Goal: Patient will ambulate household distances (50')  independently and ascend/descend 12 steps with 1 HR with modified independence  to facilitate safe return to home environment./Active    Problem: Impaired Pain Management    Problem: Impaired Psychosocial Skills/Behavior  PsychoSocial: Primary Team Goal: Pt. will improve adjustment to medical  condition and rehab process to function at the min A level/Active  PsychoSocial: Additional Team Goal:  Pt. and family will process ed re CVA,  including any psych/adjustment implications and return to work issues as  evidenced by verbalization /    Problem: Impaired Self-care Mgmt/ADL/IADL  Self Care: Primary Team Goal: Pt will complete morning routine with supervision  provided by wife./Active    Problem: Safety Risk and Restraint  Safety: Primary Team Goal: self awareness of effected side in order to prevent  neglect/    Add/Update Problems from this assessment:  No updates at this time.    Please review Integrated Patient View Care Plan Flowsheet for Team identified  Problems, Interventions, and Goals.    Signed by: Parks Neptune, RN 11/23/2014 11:21:00 PM

## 2014-11-23 NOTE — Rehab Progress Note ([facility]) (Signed)
Blake [name]  MRN: [id number]  Account: [id number]  Session Start: 11/23/2014 1:00:00 PM  Session Stop: 11/23/2014 2:00:00 PM    Occupational Therapy  Inpatient Rehabilitation Progress Note - Brief    Rehab Diagnosis: CVA (Right Brain)  Demographics:            Age: 74Y            Gender: Male  Rehabilitation Precautions/Restrictions:   Fall, left visual field cut/left neglect with issues of obstacle negotiation    SUBJECTIVE  Patient Report: "I am liking my new mattress. I feel pretty good, no headaches.  Just fatigue."  Pain: Patient currently without complaints of pain.    OBJECTIVE  General Observation: Initiated tx session with pt seated in bed speaking with CM  Lisa. Pt and case manager discussing discharge preparation. Pt AandOx4. Decreased  tangential speech noted during session. Pt pleasant throughout.    Interventions:       Therapeutic Activities:  Tx focused on gait level mobility incorporating  spatial orientation and navigation, as well as reviewing results of [place]  Driving Battery (RDB) assessment.    Completed gait level transfer to/from hospital bed and standard toilet with mod  I for increased time to identify obstacles on L side and application of head  turn and scanning techniques. Pt organized items in room with stand by assist  and min cues to prompt use of edging technique ie; edge of table, wall, book for  increased safety.    Educated pt on impact of visual-perceptual deficits on function through review  of RDB. Trialed oculomotor exercises including fixation, scanning, and  convergence/divergence with both eyes and single eye. Mod cues to prompt  self-correction of errors. Provided pt with oculomotor activity handout.  Pain Reassessment: Pain was not reassessed as no pain was reported.    Education Provided:    Education Provided: Personal assistant. Plan of care. Functional  transfers. Cognitive functioning.       Audience: Patient.       Mode:  Explanation.  Demonstration.  Teacher, English as a foreign language provided.       Response: Applied knowledge.  Verbalized understanding.  Demonstrated skill.  Needs practice.    ASSESSMENT  Pt educated on strategies for improving L attention and visual-perceptual  skills. Pt demonstrates good carryover of strategies during sessions and  provided with written reminders for performing outside of therapy. Continue IP  OT to maximize independence and carryover of techniques.    PLAN  Continued Occupational Therapy is recommended.  Recommended Frequency/Duration/Intensity: 60-120 min/day,   5-6 days/wk, 7-10  days from eval  Continued Activities Contributing Toward Care Plan: Neuro re-ed,  visual/perceptual activities, ther ex, ther act, ADL retraining, AD/AE training,  DME recs, [address] planning, caregiver training/education    3 Hour Rule Minutes: 60 minutes of OT treatment this session count towards  intensity and duration of therapy requirement. Patient was seen for the full  scheduled time of OT treatment this session.  Therapy Mode Minutes: Individual: 60 minutes.    Signed by: Murlean Caller, OTR/L 11/23/2014 2:00:00 PM

## 2014-11-23 NOTE — Rehab Progress Note ([facility]) (Signed)
Blake [name]  MRN: [id number]  Account: [id number]  Session Start: 11/23/2014 12:00:00 AM  Session Stop: 11/23/2014 12:00:00 AM    SEVERE SEPSIS SCREEN  INFECTION:  Patient has no indication of infection.  Negative Sepsis Screen.  If you are unable to assess a system's dysfunction because you do not have labs,  or the labs you have are not current (within 24 hours), call physician and  request and order for the lab tests needed.    Signed by: Eddie Dibbles, RN 11/23/2014 8:00:00 AM

## 2014-11-24 NOTE — Rehab Progress Note ([facility]) (Signed)
Blake [name]  MRN: [id number]  Account: [id number]  Session Start: 11/24/2014 12:00:00 AM  Session Stop: 11/24/2014 12:00:00 AM    Rehabilitation Nursing  Inpatient Rehabilitation Shift Assessment    Rehab Diagnosis: CVA (Right Brain)  Demographics:            Age: 63Y            Gender: Male  Primary Language: English    Date of Onset:  11/15/14  Date of Admission: 11/19/2014 6:50:00 PM    Rehabilitation Precautions Restrictions:   Fall, left visual field cut    Patient Report: "I am having a headache"  Pain: Patient currently has pain.  Location: Headache  Type: Acute  Quality: Aching.  Pain Scale: Numeric.  Patient reports a pain level of 4 out of 10.  Patient's acceptable level of pain 2 out of 10.   Pain does not interfere with any activity at this time.  Pain is alleviated by: Rest  Pain is exacerbated by: Movement No pain intervention was facilitated because  patient refused.  Pain Reassessment: Response to Pain Intervention: I feel better after resting  Post Intervention Pain Quality:  None.  Patient Reports Post Intervention Pain Level of: 0 out of 10  Pain Acceptable: Yes  Patient/Caregiver Goals:  to be able to go back to my routine    NEURO  Orientation/Awareness: Alert and Oriented x3.  Speech: Clear.  Verbal.  Verbalizes Clear Sentences.  Behavior: Cooperative.  Impulsive.    MEDICATIONS  IV Access: No IV access.  Dialysis Access: Patient does not have dialysis access.      Elopement Risk Level Assessment Tool  Patient Criteria: Patient is not capable of leaving the unit.  Assessment is not  applicable.      RISK ASSESSMENT FOR FALLS/INJURY    MENTAL STATUS CRITERIA:   10 - Impaired Judgement/impulsive.   MENTAL STATUS TOTAL: 10    AGE CRITERIA:   63 - < 10 years old  AGE TOTAL: 0    ELIMINATION CRITERIA:   3 - Toileting with Assistance.   ELIMINATION TOTAL: 3    HISTORY OF FALLS CRITERIA:   2 - Unknown History.  HISTORY OF FALLS TOTAL: 2    MEDICATIONS CRITERIA:   2 or more High Risk  Medications (*see list below)   MEDICATIONS TOTAL: 2    PHYSICAL MOBILITY CRITERIA:   3 - Decreased balance reaction.   1 - Weakness/impaired physical mobility.   PHYSICAL MOBILITY TOTAL: 4    FALLS RISK ASSESSMENT TOTAL: 21    Patient's Fall Risk: TOTAL SCORE >10: High Risk    Falls Interventions: Clutter removed and clear path to BR.  Call bell, phone, glasses, etc within reach.  Hourly toileting/safety checks between 6am and 10pm, then every 2 hours.  Initiate Fall care plan and outcome.  Yellow "high risk" patient identification in place: wrist band, socks, chart  sticker, door sign.  Pt and family education.  Assistive devices at [facility].  Family at bedside.  Low bed with mat    NUTRITION  Diet:  Type: Regular.  Food Consistency: Regular.  Liquid Consistency: Thin.      CARDIOVASCULAR     Bilateral lower extremities  Nail Bed Color: Pink.   No edema or redness present.  Pulses:   Apical Pulse: Regular. Strong. Rate is 75 .   Patient does not have a pacemaker.   Patient does not have a defibrillator.    CARDIOPULMONARY  Lung Sounds:  Upper lobes. Clear.   Lower lobes. Clear.  Type of Respirations: Regular.  Cough: No cough noted.  Respiratory Support: The patient does not require any respiratory support.  Respiratory Equipment: None. 95%    INTEGUMENTARY  Skin:  Temperature: Warm  Turgor: Normal for age  Moisture: Dry  Color of skin: Normal for Race/Ethnicity  Capillary Refill: Less than 3 seconds   Bruising: Lower abdominal bruises from Lovenox therapy  Wounds/Incisions: No wounds or incisions.  Braden Scale for Predicting Pressure Sore Risk: Sensory Perception: No  impairment  Moisture: Rarely moist  Activity: Walks occasionally  Mobility: Slightly limited  Nutrition: Adequate  Friction and Shear: No apparent problem  Braden Score: 20  Level of Risk: No risk (19-23). Will reassess every shift.    GASTROINTESTINAL  Abdomen: Soft. Nontender.  Bowel Sounds:  Active bowel sounds audible in all four quadrants.  Date of  Last Bowel Movement:  11/24/14   No Problems/Complaints with Bowel Elimination Assessed.    GENITOURINARY  Current Bladder Pattern: Continent  Color:  Yellow   Patient denies problems with urination and/or catheter.    MUSCULOSKELETAL  Upper Body: left sided vision impairment, and left sided neglect  Lower Body: none    Functional Measures    EATING: Eating Score = 7. Patient is completely independent for eating.  There  are no activity limitations.    BLADDER MANAGEMENT - LEVEL OF ASSIST: Bladder Score = 7. Patient is completely  independent for bladder management. There are no activity limitations.    BLADDER ACCIDENTS THIS SHIFT:  0 . Patient has not had an accident this  assessment and did not require medications or devices.    BOWEL MANAGEMENT - LEVEL OF ASSIST: Bowel Score = 6. Patient is modified  independent for bowel management requiring: Stool softeners    BOWEL ACCIDENTS THIS SHIFT: 0 . Patient has not had an accident, but used a  stool softener.    Education Provided:    Education Provided: Precautions. Safety issues and interventions. Fall protocol.  Supervision requirements. Use of adaptive devices. Pain management. Pain scale.  Medication options. Side effects. Clinical indicators of pain. Medication. Name  and dosage. Administration. Purpose. Side Effects. Administration of Lovenox  injections.       Audience: Patient.       Mode: Explanation.  Demonstration.       Response: Verbalized understanding.    Discharge: Patient is not being discharged at this time.    Long Term Goals:  1. Patient will demo increase self awareness on weak/ affected  side in order  to prevent neglect. - Goal Not Met  2 weeks from 11/19/14  Short Term Goals:  1. Pt will initiate use of call ligh at least 50-75% during  the day before getting OOB. - Goal Not Met   2. Pt will direct safe OOB transferes with Minimum Assistant - Goal Not Met   3. Family will be able to participate with ADLs, and toileting assistance at  least  during the day.  7 days from 11/19/14    PROGRESS TOWARD GOALS: SHORT TERM GOAL REVIEW:  Time frame to achieve short term goal(s):  7 days from 11/19/14   LONG TERM GOAL REVIEW:  Timeframe to achieve long term goal(s):  2 weeks from 11/19/14    PLAN: Nursing Specific Interventions  self awareness on effected side in order to prevent neglect  Continue with the current Nursing Plan of Care.    TEAM CARE PLAN  Identified  problems from team documentation:  Problem: Impaired Bladder Management    Problem: Impaired Bowel Management    Problem: Impaired Cognition  Cognition: Primary Team Goal: Patient will use strategies to compensate for lL  neglect in simple reading/writing/info management with no cues 75% of the  time./Active    Problem: Impaired Communication  Communication: Primary Team Goal: Pt will monitor for clarity, relevancy and  organization to convey mildy complex information (e.g. give directions to his  home) with occasional reminder (< 2X in 10 minute conversation)/Active    Problem: Impaired Leisure Skills  Leisure Skills: Primary Team Goal: Pt will demonstrate appropriate safety  awareness, mobility, and cognition in order to participate in a leisure activity  of interest at mod I level at discharge/Active    Problem: Impaired Mobility  Mobility: Primary Team Goal: Patient will ambulate household distances (50')  independently and ascend/descend 12 steps with 1 HR with modified independence  to facilitate safe return to home environment./Active    Problem: Impaired Pain Management    Problem: Impaired Psychosocial Skills/Behavior  PsychoSocial: Primary Team Goal: Pt. will improve adjustment to medical  condition and rehab process to function at the min A level/Active  PsychoSocial: Additional Team Goal:  Pt. and family will process ed re CVA,  including any psych/adjustment implications and return to work issues as  evidenced by verbalization /    Problem: Impaired Self-care Mgmt/ADL/IADL  Self Care:  Primary Team Goal: Pt will complete morning routine with supervision  provided by wife./Active    Problem: Safety Risk and Restraint  Safety: Primary Team Goal: self awareness of effected side in order to prevent  neglect/    Add/Update Problems from this assessment:  No updates at this time.    Please review Integrated Patient View Care Plan Flowsheet for Team identified  Problems, Interventions, and Goals.    Signed by: Ranae Pila, RN 11/24/2014 6:34:00 PM

## 2014-11-24 NOTE — Rehab Progress Note ([facility]) (Signed)
NAMEGREGORY [name]  MRN: [id number]  Account: [id number]  Session Start: 11/24/2014 9:00:00 AM  Session Stop: 11/24/2014 10:00:00 AM    Speech Language Pathology  Inpatient Rehabilitation Progress Note - Brief    Rehab Diagnosis: CVA (Right Brain)  Demographics:            Age: 27Y            Gender: Male  Rehabilitation Precautions/Restrictions:   Fall, left visual field cut/left neglect with issues of obstacle negotiation    SUBJECTIVE  Patient Report: "I certainly have some visual deficits"  Pain: Patient currently without complaints of pain.    OBJECTIVE  General Observations: Pt resting in bed, agreeable to therapy.    Interventions:       Speech Treatment: Verbal expression: Frequently run on and tangential.  Min-mod cues to sustain appropriate topic maintenance and communicative  exchange.    Structured complex attention drills using ipad: Numerical feedback was very  usefull in comparing status to basleine and highlighting some specific areas of  cognitive deficit. Mild-Moderate difficulty.  Pain Reassessment: Pain was not reassessed as no pain was reported.    Education Provided:    Education Provided: No education provided this session.    ASSESSMENT  With some gentle counselling and guided therapeutic tasks today, pt was able to  improve insight into some of his more tangible cognitive deficits (ie working  memory / complex attention) - use of high structure tasks, with numerical  feedback, like this may prove to be a useful component to improving self  assessment as he progresses through his recovery.    PLAN  Continued Speech Language Pathology is recommended to address:  Recommended Frequency/Duration/Intensity: 1:1 or group 60-120 minutes daily for  5-6 days week for 1-2 weeks duration then d/c to home  Continued Activities Contributing Toward Care Plan: Strategy training with  opportunity for practice along a hierarchy of difficulty, patient and family  education, collaboration with team including  neuropsych.    3 Hour Rule Minutes: 60 minutes of SLP treatment this session count towards  intensity and duration of therapy requirement. Patient was seen for the full  scheduled time of SLP treatment this session.  Therapy Mode Minutes: Individual: 60 minutes.    Signed by: Lawson Fiscal, M.S., CCC/SLP 11/24/2014 10:00:00 AM

## 2014-11-24 NOTE — Progress Notes (Signed)
[facility] Attending Face to Face Progress  Note  [X] Discussed with nurse  .[x] No new events    Subjective:  Feels well;  has no complaints.    No headache. No constipation. No chest pain. No shortness of breath.  Objective:  Vitals: BP 158/84 mmHg  Pulse 66  Temp(Src) 98.1 F (36.7 C) (Oral)  Resp 18  Ht 1.88 m (6\' 2" )  Wt 107.956 kg (238 lb)  BMI 30.54 kg/m2  SpO2 94%  Appears well.In no apparent distress.   Awake .Alert with normal mood and affect.Sclerae anicteric.   Moist mucous membranes.  Resting comfortably, No respiratory distress .      New labs   Results     ** No results found for the last 24 hours. **          Medications:     Current Facility-Administered Medications   Medication Dose Route Frequency   . aspirin  324 mg Oral Daily   . docusate sodium  100 mg Oral BID   . enoxaparin  30 mg Subcutaneous Q12H SCH   . famotidine  20 mg Oral Q12H SCH   . losartan  50 mg Oral Daily   . rosuvastatin  20 mg Oral QHS       Assessment/Plan: 63 y.o. 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   Continue comprehensive intensive inpatient rehab program. Medically stable. Continue current management.   Hendricks Limes, DO  PM&R

## 2014-11-24 NOTE — Rehab Progress Note ([facility]) (Signed)
Blake [name]  MRN: [id number]  Account: [id number]  Session Start: 11/24/2014 10:00:00 AM  Session Stop: 11/24/2014 11:00:00 AM    Physical Therapy  Inpatient Rehabilitation Progress Note - Brief    Rehab Diagnosis: CVA (Right Brain)  Demographics:            Age: 75Y            Gender: Male  Rehabilitation Precautions/Restrictions:   Fall, left visual field cut    SUBJECTIVE  Patient Report: "I have more numbness on the left side of my body today so if  I'm more clumsy that's what is going on"  Pain: Patient currently without complaints of pain.    OBJECTIVE    Interventions:       Therapeutic Activities:  Patient participated in route finding activity  using map of hospital and was instructed to go to three separate places on three  separate floors. Patient was instructed to orient his position on the map, then  verbally process with therapist the strategy he was using to find his way to the  designated location. Patient required moderate-maximal cueing to orient himself  on the map, moderate cueing to determine which direction he should turn based on  map position. Patient requires extra time and frequently stops while walking to  think. Patient required close supervision for navigation of obstacles during  ambulation without AD, and throughout entire session ambulated >2000' at one  time. Patient ran into 2-3 pieces of furniture during activity but had no LOB  and was able to recover and move out of the way without issue.  Pain Reassessment: Pain was not reassessed as no pain was reported.    ASSESSMENT  Patient requires frequent cueing for route finding and extended time to process  information. Patient becomes frustrated at times when he realizes that he used  to have no issue with finding places on a map. Educated patient regarding the  different strategies that he could use to compensate for the difficulty he is  having with this task.    PLAN  Continued Physical Therapy is recommended.  Recommended  Frequency/Duration/Intensity: 60-120 minutes/day, for 7-10 days from  initial evaluation  Activities Contributing Toward Care Plan: Therapeutic exercise, therapeutic  activities, visual adaptation techniques, balance training, coordination  training, gait and stair negotiation training, obstacle negotiation, and family  training and discharge planning all with an interdisciplinary approach to care    3 Hour Rule Minutes: 60 minutes of PT treatment this session count towards  intensity and duration of therapy requirement. Patient was seen for the full  scheduled time of PT treatment this session.  Therapy Mode Minutes: Individual: 60 minutes.    Signed by: Malachy Mood, PT 11/24/2014 11:00:00 AM

## 2014-11-24 NOTE — Rehab Progress Note ([facility]) (Signed)
Blake [name]  MRN: [id number]  Account: [id number]  Session Start: 11/24/2014 1:00:00 PM  Session Stop: 11/24/2014 2:00:00 PM    Physical Therapy  Inpatient Rehabilitation Progress Note - Brief    Rehab Diagnosis: CVA (Right Brain)  Demographics:            Age: 73Y            Gender: Male  Rehabilitation Precautions/Restrictions:   Fall, left visual field cut    SUBJECTIVE  Patient Report: Pt. stated that he had a very busy morning and then had his  family visiting him at lunch, so the pt. was very tired. Pt. agreeable to  session.  Pain: Patient currently without complaints of pain.    OBJECTIVE  General Observations: Patient has difficulty looking to the left side without  being cued, often looks away from therapist or down at the floor when speaking  even when therapist is within his field of vision.  Vital Signs:                       Current Value                Previous Value  Vitals  BP Systolic          131                          163/107  BP Diastolic         81                           -  Pulse                73                           61  Respirations         -                            18    Interventions:       Therapeutic Activities:  Today's session focused on balance activities,  left attention, and stairs. Pt. Indep transferred out of bed and amb >300' to  the gym without an AD and close S. Pt. amb with increased toe out B and veers  side to side occasionally. Pt. had a self-corrected LOB during amb. Pt.  performed 16 step negotiation with 1 HR and S. Pt. performed balance activities  in the parallel bars that included: standing on the airex foam pt. performed  head turns to the R/L and up/down without BUE support, Pt. performed diagonal  chops holding onto a therapy ball B directions without a LOB, pt. performed cone  taps B and had increased difficulty tapping with L foot and required cues to tap  all of the cones to his L. Pt. performed high knee walking x 30'x 2 with CGA and  had  multiple LOB that he self corrected. Pt. initially had difficultly carrying  on a conversation during amb and appeared to have word finding difficulties. Pt.  was able to converse better during amb at the end of the session. Pt. amb back  up to room and was left seated on bed with call light in reach and needs met.  Pain Reassessment: Pain was not reassessed  as no pain was reported.      ASSESSMENT  Pt. tolerated the higher level balance activities and will continue to benefit  from balance training to improve his functional independence. Pt. has increased  difficulty with directional cues and requires increased processing time.    PLAN  Continued Physical Therapy is recommended.  Recommended Frequency/Duration/Intensity: 60-120 minutes/day, for 7-10 days from  initial evaluation  Activities Contributing Toward Care Plan: Therapeutic exercise, therapeutic  activities, visual adaptation techniques, balance training, coordination  training, gait and stair negotiation training, obstacle negotiation, and family  training and discharge planning all with an interdisciplinary approach to care    3 Hour Rule Minutes: 60 minutes of PT treatment this session count towards  intensity and duration of therapy requirement. Patient was seen for the full  scheduled time of PT treatment this session.  Therapy Mode Minutes: Individual: 60 minutes.    Signed by: Maylon Peppers, PT, DPT 11/24/2014 2:00:00 PM

## 2014-11-24 NOTE — Rehab Progress Note ([facility]) (Signed)
NAMEJAMIEON LANNEN  MRN: [id number]  Account: [id number]  Session Start: 11/24/2014 12:00:00 AM  Session Stop: 11/24/2014 12:00:00 AM    SEVERE SEPSIS SCREEN  INFECTION:  Patient has no indication of infection.  Negative Sepsis Screen.  If you are unable to assess a system's dysfunction because you do not have labs,  or the labs you have are not current (within 24 hours), call physician and  request and order for the lab tests needed.    Signed by: Ranae Pila, RN 11/24/2014 8:00:00 AM

## 2014-11-25 NOTE — Rehab Progress Note ([facility]) (Signed)
[name]  MRN: [id number]  Account: [id number]  Session Start: 11/25/2014 12:00:00 AM  Session Stop: 11/25/2014 12:00:00 AM    Rehabilitation Nursing  Inpatient Rehabilitation Shift Assessment    Rehab Diagnosis: CVA (Right Brain)  Demographics:            Age: 63Y            Gender: Male  Primary Language: English    Date of Onset:  11/15/14  Date of Admission: 11/19/2014 6:50:00 PM    Rehabilitation Precautions Restrictions:   Fall, left visual field cut    Patient Report: " My headache is better today"  Pain: Patient currently without complaints of pain.  Pain Reassessment: Pain was not reassessed as no pain was reported.  Patient/Caregiver Goals:  to be able to go back to my routine    NEURO  Orientation/Awareness: Alert and Oriented x3.  Speech: Clear.  Verbal.  Verbalizes Clear Sentences.  Behavior: Cooperative.  Impulsive.    MEDICATIONS  IV Access: No IV access.  Dialysis Access: Patient does not have dialysis access.      Elopement Risk Level Assessment Tool  Patient Criteria: Patient is not capable of leaving the unit.  Assessment is not  applicable.      RISK ASSESSMENT FOR FALLS/INJURY    MENTAL STATUS CRITERIA:   10 - Impaired Judgement/impulsive.   10 - Impaired memory.   MENTAL STATUS TOTAL: 20    AGE CRITERIA:   48 - < 41 years old  AGE TOTAL: 0    ELIMINATION CRITERIA:   3 - Toileting with Assistance.   ELIMINATION TOTAL: 3    HISTORY OF FALLS CRITERIA:   2 - Unknown History.  HISTORY OF FALLS TOTAL: 2    MEDICATIONS CRITERIA:   2 or more High Risk Medications (*see list below)   MEDICATIONS TOTAL: 2    PHYSICAL MOBILITY CRITERIA:   3 - Decreased balance reaction.   1 - Weakness/impaired physical mobility.   PHYSICAL MOBILITY TOTAL: 4    FALLS RISK ASSESSMENT TOTAL: 31    Patient's Fall Risk: TOTAL SCORE >10: High Risk    Falls Interventions: Clutter removed and clear path to BR.  Call bell, phone, glasses, etc within reach.  Hourly toileting/safety checks between 6am and 10pm, then every  2 hours.  Initiate Fall care plan and outcome.  Yellow "high risk" patient identification in place: wrist band, socks, chart  sticker, door sign.  Pt and family education.  Assistive devices at [facility].  Reoriented PRN.  Family at bedside.  Low bed with mat  Bed alarm    NUTRITION  Diet:  Type: Regular.  Food Consistency: Regular.  Liquid Consistency: Thin.      CARDIOVASCULAR     Bilateral lower extremities  Nail Bed Color: Pink.   No edema or redness present.  Pulses:   Apical Pulse: Regular. Strong. Rate is 66 .   Patient does not have a pacemaker.   Patient does not have a defibrillator.    CARDIOPULMONARY  Lung Sounds:   Upper lobes. Clear.   Lower lobes. Clear.  Type of Respirations: Regular.  Cough: No cough noted.  Respiratory Support: The patient does not require any respiratory support.  Respiratory Equipment: None. 96%    INTEGUMENTARY  Skin:  Temperature: Warm  Turgor: Normal for age  Moisture: Dry  Color of skin: Normal for Race/Ethnicity  Capillary Refill: Less than 3 seconds   Bruising: Lower abdominal bruises from Lovenox therapy  Wounds/Incisions: No wounds or incisions.  Braden Scale for Predicting Pressure Sore Risk: Sensory Perception: No  impairment  Moisture: Rarely moist  Activity: Walks frequently  Mobility: Slightly limited  Nutrition: Adequate  Friction and Shear: No apparent problem  Braden Score: 21  Level of Risk: No risk (19-23). Will reassess every shift.    GASTROINTESTINAL  Abdomen: Soft. Nontender.  Bowel Sounds:  Active bowel sounds audible in all four quadrants.  Date of Last Bowel Movement:  11/25/14   No Problems/Complaints with Bowel Elimination Assessed.    GENITOURINARY  Current Bladder Pattern: Continent  Color:  Yellow   Patient denies problems with urination and/or catheter.    MUSCULOSKELETAL  Upper Body: left sided vision impairment, and left sided neglect  Lower Body: none    Functional Measures    EATING: Eating Score = 7. Patient is completely independent for eating.   There  are no activity limitations.    BLADDER MANAGEMENT - LEVEL OF ASSIST: Bladder Score = 7. Patient is completely  independent for bladder management. There are no activity limitations.    BLADDER ACCIDENTS THIS SHIFT:  0 . Patient has not had an accident this  assessment and did not require medications or devices.    BOWEL MANAGEMENT - LEVEL OF ASSIST: Bowel Score = 6. Patient is modified  independent for bowel management requiring: Stool softeners    BOWEL ACCIDENTS THIS SHIFT: 0 . Patient has not had an accident, but used a  stool softener.    Education Provided:    Education Provided: Precautions. Safety issues and interventions. Fall protocol.  Supervision requirements. Use of adaptive devices. Pain management. Pain scale.  Medication options. Side effects. Medication. Name and dosage. Administration.  Purpose. Side Effects. Administration of Lovenox injections.       Audience: Patient.       Mode: Explanation.  Demonstration.       Response: Verbalized understanding.    Discharge: Patient is not being discharged at this time.    Long Term Goals:  1. Patient will demo increase self awareness on weak/ affected  side in order  to prevent neglect. - Goal Not Met  2 weeks from 11/19/14  Short Term Goals:  1. Pt will initiate use of call ligh at least 50-75% during  the day before getting OOB. - Goal Not Met   2. Pt will direct safe OOB transferes with Minimum Assistant - Goal Not Met   3. Family will be able to participate with ADLs, and toileting assistance at  least during the day.  7 days from 11/19/14    PROGRESS TOWARD GOALS: SHORT TERM GOAL REVIEW:  Time frame to achieve short term goal(s):  7 days from 11/19/14   LONG TERM GOAL REVIEW:  Timeframe to achieve long term goal(s):  2 weeks from 11/19/14    PLAN: Nursing Specific Interventions  self awareness on effected side in order to prevent neglect  Continue with the current Nursing Plan of Care.    TEAM CARE PLAN  Identified problems from team  documentation:  Problem: Impaired Bladder Management    Problem: Impaired Bowel Management    Problem: Impaired Cognition  Cognition: Primary Team Goal: Patient will use strategies to compensate for lL  neglect in simple reading/writing/info management with no cues 75% of the  time./Active    Problem: Impaired Communication  Communication: Primary Team Goal: Pt will monitor for clarity, relevancy and  organization to convey mildy complex information (e.g. give directions to his  home) with occasional reminder (< 2X in 10 minute conversation)/Active    Problem: Impaired Leisure Skills  Leisure Skills: Primary Team Goal: Pt will demonstrate appropriate safety  awareness, mobility, and cognition in order to participate in a leisure activity  of interest at mod I level at discharge/Active    Problem: Impaired Mobility  Mobility: Primary Team Goal: Patient will ambulate household distances (50')  independently and ascend/descend 12 steps with 1 HR with modified independence  to facilitate safe return to home environment./Active    Problem: Impaired Pain Management    Problem: Impaired Psychosocial Skills/Behavior  PsychoSocial: Primary Team Goal: Pt. will improve adjustment to medical  condition and rehab process to function at the min A level/Active  PsychoSocial: Additional Team Goal:  Pt. and family will process ed re CVA,  including any psych/adjustment implications and return to work issues as  evidenced by verbalization /    Problem: Impaired Self-care Mgmt/ADL/IADL  Self Care: Primary Team Goal: Pt will complete morning routine with supervision  provided by wife./Active    Problem: Safety Risk and Restraint  Safety: Primary Team Goal: self awareness of effected side in order to prevent  neglect/    Add/Update Problems from this assessment:  No updates at this time.    Please review Integrated Patient View Care Plan Flowsheet for Team identified  Problems, Interventions, and Goals.    Signed by: Ranae Pila, RN  11/25/2014 6:50:00 PM

## 2014-11-25 NOTE — Rehab Progress Note ([facility]) (Signed)
[name]  MRN: [id number]  Account: [id number]  Session Start: 11/24/2014 12:00:00 AM  Session Stop: 11/24/2014 12:00:00 AM    SEVERE SEPSIS SCREEN  INFECTION:  Patient has no indication of infection.  Negative Sepsis Screen.  If you are unable to assess a system's dysfunction because you do not have labs,  or the labs you have are not current (within 24 hours), call physician and  request and order for the lab tests needed.    Signed by: Dolores Hoose, RN 11/24/2014 8:10:00 PM

## 2014-11-25 NOTE — Rehab Progress Note ([facility]) (Signed)
Blake [name]  MRN: [id number]  Account: [id number]  Session Start: 11/25/2014 9:00:00 AM  Session Stop: 11/25/2014 10:00:00 AM    Speech Language Pathology  Inpatient Rehabilitation Progress Note - Brief    Rehab Diagnosis: CVA (Right Brain)  Demographics:            Age: 38Y            Gender: Male  Rehabilitation Precautions/Restrictions:   Fall, left visual field cut    SUBJECTIVE  Patient Report: (re: verbosity/tangential communication) "Generally I'm aware of  it because people are giving me feedback."  Pain: Patient currently without complaints of pain.    OBJECTIVE  General Observations: Pt resting in bed, agreeable to therapy.    Interventions:       Speech Treatment: Targeted pragmatics via feedback and exercises for clear,  concise communication. Tasked with explaining "visual field cut" in 2 mins;  given 5 mins to prepare. Chose to not plan out on paper. Dem'd good word  selection for audience but had difficulty maintaining timeframe.      ASSESSMENT  Continues to need structure, cues and feedback for clear, concise verbal  communication. Vast medical knowledge and baseline personality (verified by  wife) predispose pt to verbosity. Recent impairments related to CVA  limit his  ability to monitor and adapt to fit constraints.      PLAN  Continued Speech Language Pathology is recommended to address:  Recommended Frequency/Duration/Intensity: 1:1 or group 60-120 minutes daily for  5-6 days week for 1-2 weeks duration then d/c to home  Continued Activities Contributing Toward Care Plan: Strategy training with  opportunity for practice along a hierarchy of difficulty, patient and family  education, collaboration with team including neuropsych.    3 Hour Rule Minutes: 60 minutes of SLP treatment this session count towards  intensity and duration of therapy requirement. Patient was seen for the full  scheduled time of SLP treatment this session.  Therapy Mode Minutes: Individual: 60 minutes.    Signed  by: Ladell Heads, M.A., CCC-SLP 11/25/2014 10:00:00 AM

## 2014-11-25 NOTE — Progress Notes (Signed)
[facility] Physiatry Attending Face to Face Progress  Note  [X] Discussed with nurse  .[x] No new events    Subjective:  Denies HA, slept better; c/o mild numbness in the L UE proximally     No constipation. No chest pain. No shortness of breath.  Objective:  Vitals: BP 126/70 mmHg  Pulse 56  Temp(Src) 96.6 F (35.9 C) (Oral)  Resp 18  Ht 1.88 m (6\' 2" )  Wt 107.956 kg (238 lb)  BMI 30.54 kg/m2  SpO2 96%  Appears well.In no apparent distress.   Awake .Alert with normal mood and affect.Sclerae anicteric.   Moist mucous membranes.  Resting comfortably, No respiratory distress .      New labs   Results     ** No results found for the last 24 hours. **          Medications:     Current Facility-Administered Medications   Medication Dose Route Frequency   . aspirin  324 mg Oral Daily   . docusate sodium  100 mg Oral BID   . enoxaparin  30 mg Subcutaneous Q12H SCH   . famotidine  20 mg Oral Q12H SCH   . losartan  50 mg Oral Daily   . rosuvastatin  20 mg Oral QHS       Assessment/Plan: 63 y.o. 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   Continue comprehensive intensive inpatient rehab program. Medically stable. Continue current management.   Hendricks Limes, DO  PM&R

## 2014-11-25 NOTE — Rehab Progress Note ([facility]) (Signed)
Blake [name]  MRN: [id number]  Account: [id number]  Session Start: 11/25/2014 12:00:00 AM  Session Stop: 11/25/2014 12:00:00 AM    Rehabilitation Nursing  Inpatient Rehabilitation Shift Assessment    Rehab Diagnosis: CVA (Right Brain)  Demographics:            Age: 63Y            Gender: Male  Primary Language: English    Date of Onset:  11/15/14  Date of Admission: 11/19/2014 6:50:00 PM    Rehabilitation Precautions Restrictions:   Fall, left visual field cut    Patient Report: The noise in here is unbearable  Pain: Patient currently without complaints of pain.  Pain Reassessment: Pain was not reassessed as no pain was reported.  Patient/Caregiver Goals:  to be able to go back to my routine    NEURO  Orientation/Awareness: Alert and Oriented x2.  Speech: No deficits noted at this time.  Expresses Needs Functionally.  Behavior: Cooperative.    MEDICATIONS  IV Access: No IV access.  Dialysis Access: Patient does not have dialysis access.      Elopement Risk Level Assessment Tool  Patient Criteria: Patient is not capable of leaving the unit.  Assessment is not  applicable.      RISK ASSESSMENT FOR FALLS/INJURY    MENTAL STATUS CRITERIA:   0 - None identified.  MENTAL STATUS TOTAL: 0    AGE CRITERIA:   6 - < 16 years old  AGE TOTAL: 0    ELIMINATION CRITERIA:   3 - Toileting with Assistance.   ELIMINATION TOTAL: 3    HISTORY OF FALLS CRITERIA:   2 - Unknown History.  HISTORY OF FALLS TOTAL: 2    MEDICATIONS CRITERIA:   2 or more High Risk Medications (*see list below)   MEDICATIONS TOTAL: 2    PHYSICAL MOBILITY CRITERIA:   3 - Decreased balance reaction.   1 - Weakness/impaired physical mobility.   PHYSICAL MOBILITY TOTAL: 4    FALLS RISK ASSESSMENT TOTAL: 11    Patient's Fall Risk: TOTAL SCORE >10: High Risk    Falls Interventions: Call bell, phone, glasses, etc within reach.  Hourly toileting/safety checks between 6am and 10pm, then every 2 hours.  Initiate Fall care plan and outcome.  Yellow "high risk"  patient identification in place: wrist band, socks, chart  sticker, door sign.  Pt and family education.  Assistive devices at [facility].    NUTRITION  Diet:  Type: Regular.  Food Consistency: Regular.  Liquid Consistency: Thin.      CARDIOVASCULAR     Bilateral lower extremities  Nail Bed Color: Pink.   No edema or redness present.  Pulses:   Apical Pulse: Regular. Strong. Rate is 78 .   Patient does not have a pacemaker.   Patient does not have a defibrillator.    CARDIOPULMONARY  Lung Sounds:   Upper lobes. Clear.   Lower lobes. Clear.  Type of Respirations:  Cough: No cough noted.  Respiratory Support: The patient does not require any respiratory support.  Respiratory Equipment:    INTEGUMENTARY  Skin:  Temperature: Cool  Turgor: Elastic  Moisture: Dry  Color of skin: Normal for Race/Ethnicity  Capillary Refill: Greater than 3 seconds  Wounds/Incisions: No wounds or incisions.  Braden Scale for Predicting Pressure Sore Risk: Sensory Perception: Slightly  limited  Moisture: Rarely moist  Activity: Walks occasionally  Mobility: Slightly limited  Nutrition: Adequate  Friction and Shear: No apparent problem  Braden Score:  19  Level of Risk: No risk (19-23). Will reassess every shift.    GASTROINTESTINAL  Abdomen: Soft. Nontender.  Bowel Sounds:  Active bowel sounds audible in all four quadrants.  Date of Last Bowel Movement:   Problems/Complaints with Bowel Elimination:    GENITOURINARY  Current Bladder Pattern: Incontinent  Color:   Patient denies problems with urination and/or catheter.    MUSCULOSKELETAL  Upper Body: left sided vision impairment, and left sided neglect  Lower Body: none    Functional Measures    EATING: Eating Score = 7. Patient is completely independent for eating.  There  are no activity limitations.    BLADDER MANAGEMENT - LEVEL OF ASSIST: Bladder Score = 7. Patient is completely  independent for bladder management. There are no activity limitations.   There was no bladder accident.    BLADDER  ACCIDENTS THIS SHIFT:  0 . Patient has not had an accident this  assessment and did not require medications or devices.    BOWEL MANAGEMENT - LEVEL OF ASSIST: Bowel Score = 7. Patient is completely  independent for bowel management but uses food to maintain satisfactory  elimination. stool softeners used; no BM    BOWEL ACCIDENTS THIS SHIFT: 0 . Patient has not had an accident and did not  require medications or devices.    Education Provided:    Education Provided: No education provided this session.    Discharge: Patient is not being discharged at this time.    Long Term Goals:  1. Patient will demo increase self awareness on weak/ affected  side in order  to prevent neglect. - Goal Not Met  2 weeks from 11/19/14  Short Term Goals:  1. Pt will initiate use of call ligh at least 50-75% during  the day before getting OOB. - Goal Not Met   2. Pt will direct safe OOB transferes with Minimum Assistant - Goal Not Met   3. Family will be able to participate with ADLs, and toileting assistance at  least during the day.  7 days from 11/19/14    PROGRESS TOWARD GOALS: Path to goals    PLAN: Nursing Specific Interventions  self awareness on effected side in order to prevent neglect  Continue with the current Nursing Plan of Care.    TEAM CARE PLAN  Identified problems from team documentation:  Problem: Impaired Bladder Management    Problem: Impaired Bowel Management    Problem: Impaired Cognition  Cognition: Primary Team Goal: Patient will use strategies to compensate for lL  neglect in simple reading/writing/info management with no cues 75% of the  time./Active    Problem: Impaired Communication  Communication: Primary Team Goal: Pt will monitor for clarity, relevancy and  organization to convey mildy complex information (e.g. give directions to his  home) with occasional reminder (< 2X in 10 minute conversation)/Active    Problem: Impaired Leisure Skills  Leisure Skills: Primary Team Goal: Pt will demonstrate appropriate  safety  awareness, mobility, and cognition in order to participate in a leisure activity  of interest at mod I level at discharge/Active    Problem: Impaired Mobility  Mobility: Primary Team Goal: Patient will ambulate household distances (50')  independently and ascend/descend 12 steps with 1 HR with modified independence  to facilitate safe return to home environment./Active    Problem: Impaired Pain Management    Problem: Impaired Psychosocial Skills/Behavior  PsychoSocial: Primary Team Goal: Pt. will improve adjustment to medical  condition and rehab process to function at  the min A level/Active  PsychoSocial: Additional Team Goal:  Pt. and family will process ed re CVA,  including any psych/adjustment implications and return to work issues as  evidenced by verbalization /    Problem: Impaired Self-care Mgmt/ADL/IADL  Self Care: Primary Team Goal: Pt will complete morning routine with supervision  provided by wife./Active    Problem: Safety Risk and Restraint  Safety: Primary Team Goal: self awareness of effected side in order to prevent  neglect/    Add/Update Problems from this assessment:  No updates at this time.    Please review Integrated Patient View Care Plan Flowsheet for Team identified  Problems, Interventions, and Goals.    Signed by: Dolores Hoose, RN 11/25/2014 3:58:00 AM

## 2014-11-25 NOTE — Rehab Progress Note ([facility]) (Signed)
NAMENAYEF COLLEGE  MRN: [id number]  Account: [id number]  Session Start: 11/25/2014 12:00:00 AM  Session Stop: 11/25/2014 12:00:00 AM    SEVERE SEPSIS SCREEN  INFECTION:  Patient has no indication of infection.  Negative Sepsis Screen.  If you are unable to assess a system's dysfunction because you do not have labs,  or the labs you have are not current (within 24 hours), call physician and  request and order for the lab tests needed.    Signed by: Sherlyn Lick, RN 11/25/2014 9:00:00 PM

## 2014-11-25 NOTE — Rehab Progress Note ([facility]) (Signed)
Blake [name]  MRN: [id number]  Account: [id number]  Session Start: 11/25/2014 12:00:00 AM  Session Stop: 11/25/2014 12:00:00 AM    SEVERE SEPSIS SCREEN  INFECTION:  Patient has no indication of infection.  Negative Sepsis Screen.  If you are unable to assess a system's dysfunction because you do not have labs,  or the labs you have are not current (within 24 hours), call physician and  request and order for the lab tests needed.    Signed by: Ranae Pila, RN 11/25/2014 7:40:00 AM

## 2014-11-26 MED ORDER — AMLODIPINE BESYLATE 5 MG PO TABS
5.0000 mg | ORAL_TABLET | Freq: Every day | ORAL | Status: DC
Start: 2014-11-26 — End: 2014-11-27
  Administered 2014-11-26 – 2014-11-27 (×2): 5 mg via ORAL
  Filled 2014-11-26 (×2): qty 1

## 2014-11-26 NOTE — Rehab Progress Note ([facility]) (Signed)
Blake [name]  MRN: [id number]  Account: [id number]  Session Start: 11/26/2014 12:00:00 AM  Session Stop: 11/26/2014 12:00:00 AM    Rehabilitation Nursing  Inpatient Rehabilitation Shift Assessment    Rehab Diagnosis: CVA (Right Brain)  Demographics:            Age: 63Y            Gender: Male  Primary Language: English    Date of Onset:  11/15/14  Date of Admission: 11/19/2014 6:50:00 PM    Rehabilitation Precautions Restrictions:   Fall, left visual field cut    Patient Report: " it just seems like each day is the same".  Pain: Patient currently without complaints of pain.  Pain Reassessment: Pain was not reassessed as no pain was reported.  Patient/Caregiver Goals:  to be able to go back to my routine    NEURO  Orientation/Awareness: Alert and Oriented x3.  Speech: Clear.  Behavior: Cooperative.    MEDICATIONS  IV Access: No IV access.  Dialysis Access: Patient does not have dialysis access.      Elopement Risk Level Assessment Tool  Patient Criteria: Patient is not capable of leaving the unit.  Assessment is not  applicable.      RISK ASSESSMENT FOR FALLS/INJURY    MENTAL STATUS CRITERIA:   0 - None identified.  MENTAL STATUS TOTAL: 0    AGE CRITERIA:   20 - < 1 years old  AGE TOTAL: 0    ELIMINATION CRITERIA:   0 - None identified.  ELIMINATION TOTAL: 0    HISTORY OF FALLS CRITERIA:   2 - Unknown History.  HISTORY OF FALLS TOTAL: 2    MEDICATIONS CRITERIA:   2 or more High Risk Medications (*see list below)   MEDICATIONS TOTAL: 2    PHYSICAL MOBILITY CRITERIA:   1 - Weakness/impaired physical mobility.   PHYSICAL MOBILITY TOTAL: 1    FALLS RISK ASSESSMENT TOTAL: 5    Patient's Fall Risk: TOTAL SCORE is <=10:  Low Risk.    Falls Interventions: Clutter removed and clear path to BR.  Call bell, phone, glasses, etc within reach.  Hourly toileting/safety checks between 6am and 10pm, then every 2 hours.  Yellow "high risk" patient identification in place: wrist band, socks, chart  sticker, door  sign.  Assistive devices at [facility].  Activity basket or distraction.    NUTRITION  Diet:  Type: Cardiac.  Food Consistency: Regular.  Liquid Consistency: Thin.      CARDIOVASCULAR     Bilateral lower extremities  Nail Bed Color: Pink.   No edema or redness present.  Pulses:   Apical Pulse: Regular. Strong. Rate is .   Patient does not have a pacemaker.   Patient does not have a defibrillator.    CARDIOPULMONARY  Lung Sounds:   Upper lobes. Clear.   Lower lobes. Clear.  Type of Respirations: Regular.  Cough: No cough noted.  Respiratory Support: The patient does not require any respiratory support.  Respiratory Equipment: None.    INTEGUMENTARY  Skin:  Temperature: Warm  Turgor: Normal for age  Moisture: Dry  Color of skin: Normal for Race/Ethnicity  Capillary Refill: Less than 3 seconds  Wounds/Incisions: No wounds or incisions.  Braden Scale for Predicting Pressure Sore Risk: Sensory Perception: No  impairment  Moisture: Rarely moist  Activity: Walks occasionally  Mobility: No limitation  Nutrition: Excellent  Friction and Shear: No apparent problem  Braden Score: 22  Level of Risk: No risk (19-23).  Will reassess every shift.    GASTROINTESTINAL  Abdomen: Soft. Nontender.  Bowel Sounds:  Active bowel sounds audible in all four quadrants.  Date of Last Bowel Movement:  12/20   No Problems/Complaints with Bowel Elimination Assessed.    GENITOURINARY  Current Bladder Pattern: Continent  Color:   Patient denies problems with urination and/or catheter.    MUSCULOSKELETAL  Upper Body: left sided vision impairment, and left sided neglect  Lower Body: none    Functional Measures      TOILETING: Toileting Score = 6.  Patient is modified independent for toileting,  requiring: Grab bar.    BLADDER MANAGEMENT - LEVEL OF ASSIST: Bladder Score = 7. Patient is completely  independent for bladder management. There are no activity limitations.    BLADDER ACCIDENTS THIS SHIFT:  0 . Patient has not had an accident this  assessment and  did not require medications or devices.    BOWEL MANAGEMENT - LEVEL OF ASSIST: Bowel Score = 6. Patient is modified  independent for bowel management requiring: Stool softeners    BOWEL ACCIDENTS THIS SHIFT: 0 . Patient has not had an accident, but used a  stool softener.    TRANSFERS BED/CHAIR/WHEELCHAIR: Bed/chair/wheelchair Transfer Score = 6.  Patient is modified independent for transferring to and from the  bed/chair/wheelchair, requiring: Safety considerations.  Elevated head of bed.  Elevated bed/surface.    Education Provided:  No education provided this session.    Discharge: Patient is not being discharged at this time.    Long Term Goals:  1. Patient will demo increase self awareness on weak/ affected  side in order  to prevent neglect. - Goal Not Met  2 weeks from 11/19/14  Short Term Goals:  1. Pt will initiate use of call ligh at least 50-75% during  the day before getting OOB. - Goal Not Met   2. Pt will direct safe OOB transferes with Minimum Assistant - Goal Not Met   3. Family will be able to participate with ADLs, and toileting assistance at  least during the day.  7 days from 11/19/14    PROGRESS TOWARD GOALS: Pt. able to ambulate/transfer indepndently.    PLAN: Nursing Specific Interventions  self awareness on effected side in order to prevent neglect  Continue with the current Nursing Plan of Care.    TEAM CARE PLAN  Identified problems from team documentation:  Problem: Impaired Bladder Management    Problem: Impaired Bowel Management    Problem: Impaired Cognition  Cognition: Primary Team Goal: Patient will use strategies to compensate for lL  neglect in simple reading/writing/info management with no cues 75% of the  time./Active    Problem: Impaired Communication  Communication: Primary Team Goal: Pt will monitor for clarity, relevancy and  organization to convey mildy complex information (e.g. give directions to his  home) with occasional reminder (< 2X in 10 minute  conversation)/Active    Problem: Impaired Leisure Skills  Leisure Skills: Primary Team Goal: Pt will demonstrate appropriate safety  awareness, mobility, and cognition in order to participate in a leisure activity  of interest at mod I level at discharge/Active    Problem: Impaired Mobility  Mobility: Primary Team Goal: Patient will ambulate household distances (50')  independently and ascend/descend 12 steps with 1 HR with modified independence  to facilitate safe return to home environment./Active    Problem: Impaired Pain Management    Problem: Impaired Psychosocial Skills/Behavior  PsychoSocial: Primary Team Goal: Pt. will improve adjustment to  medical  condition and rehab process to function at the min A level/Active  PsychoSocial: Additional Team Goal:  Pt. and family will process ed re CVA,  including any psych/adjustment implications and return to work issues as  evidenced by verbalization /    Problem: Impaired Self-care Mgmt/ADL/IADL  Self Care: Primary Team Goal: Pt will complete morning routine with supervision  provided by wife./Active    Problem: Safety Risk and Restraint  Safety: Primary Team Goal: self awareness of effected side in order to prevent  neglect/    Add/Update Problems from this assessment:  No updates at this time.    Please review Integrated Patient View Care Plan Flowsheet for Team identified  Problems, Interventions, and Goals.    Signed by: Kerrie Pleasure, RN 11/26/2014 12:20:00 PM

## 2014-11-26 NOTE — Rehab Progress Note ([facility]) (Signed)
NAMEKENYON EICHELBERGER  MRN: [id number]  Account: [id number]  Session Start: 11/26/2014 12:00:00 AM  Session Stop: 11/26/2014 12:00:00 AM    SEVERE SEPSIS SCREEN  INFECTION:  Patient has no indication of infection.  Negative Sepsis Screen.  If you are unable to assess a system's dysfunction because you do not have labs,  or the labs you have are not current (within 24 hours), call physician and  request and order for the lab tests needed.    Signed by: Kerrie Pleasure, RN 11/26/2014 8:35:00 AM

## 2014-11-26 NOTE — Progress Notes (Signed)
[facility] Physiatry Attending Face to Face Progress Note   Functional Status/Update:   I reviewed patient's therapy notes to assess functional status and ongoing need for therapies. Of note, participating well in therapy and slowly improving.    Subjective:   Feels well and is without complaints. No pain. No headache. Sleeping well. No constipation. No chest pain. No shortness of breath. No other concerns.    Objective:   Filed Vitals:    11/24/14 1925 11/25/14 0608 11/25/14 1929 11/26/14 0619   BP: 158/84 126/70 142/87 161/91   Pulse: 66 56 63 62   Temp: 98.1 F (36.7 C) 96.6 F (35.9 C) 96.8 F (36 C) 98.1 F (36.7 C)   TempSrc:       Resp: 18 18 18 18    Height:       Weight:       SpO2: 94% 96% 95% 94%       Physical Examination:   Appears well.  In no acute distress. Overweight.   Conjunctivae non-injected. EOMI.   Symmetric facies. Hearing grossly intact. Moist mucous membranes.   +S1S2 Heart rate and rhythm are regular. No significant lower limb edema.   No iwob   Soft. Non-tender. Normoactive bowel sounds.   Alert Pleasant and cooperative     New Labs:  Results     ** No results found for the last 24 hours. **          Current medications:   Scheduled Meds:  Current Facility-Administered Medications   Medication Dose Route Frequency   . amLODIPine  5 mg Oral Daily   . aspirin  324 mg Oral Daily   . docusate sodium  100 mg Oral BID   . enoxaparin  30 mg Subcutaneous Q12H SCH   . famotidine  20 mg Oral Q12H SCH   . losartan  50 mg Oral Daily   . rosuvastatin  20 mg Oral QHS     PRN Meds:.magnesium hydroxide, meclizine, oxyCODONE      Assessment: 63 y.o. male with h/o HLD, HTN now s/p R PCA CVA.  Plan:   REHAB: Continue comprehensive and intensive inpatient rehab program, including:   Physical therapy 60-120 min daily, 5-6 times per week, Occupational therapy 60-120 min daily, 5-6 times per week, Speech therapy 60-120 min daily, 5-6 times per week, Case management and Rehabilitation nursing    Will continue to  address the following impairments and issues:  Mobility, ADLs, Aphasia, Cognitive impairments, Impaired ROM, Impaired endurance, Adherence to precautions, Caregiver training, Medication management, Adjustment to disability, Community support and resources, Impaired coordination, Impaired balance and Coping strategies    R PCA stroke: rehab above for ADL dysfunction and retraining.Rehab psychology following. Bp goal 130-150. Neuro to plan on doing CTA for better visualization of carotid stenosis.  VTE prophy: full dose aspirin  HTN: on losartan, restarting 5 mg amlodipine as has been in 160s   HLD: started on rosuvastatin this hospitalization  Dispo: [facility] 1 week,  F/U: with neurology, outpatient  I have discussed the plan of care with the resident physician and examined patient independently.  I have reviewed the note above and concur with findings and plan.  Additional comments are:  Doing well in tx.  Discussed with ICM re: d/c plans.  Discussed with Dr. Francesco Sor re: CT angio.  Vascular surgery consult as an out patient.  Discussed with the pt and his wife at length re: status and [place] plans.  Team conf and tentative d/c tomorrow.

## 2014-11-26 NOTE — Rehab Progress Note ([facility]) (Signed)
Blake [name]  MRN: [id number]  Account: [id number]  Session Start: 11/25/2014 12:00:00 AM  Session Stop: 11/25/2014 12:00:00 AM    Rehabilitation Nursing  Inpatient Rehabilitation Shift Assessment    Rehab Diagnosis: CVA (Right Brain)  Demographics:            Age: 63Y            Gender: Male  Primary Language: English    Date of Onset:  11/15/14  Date of Admission: 11/19/2014 6:50:00 PM    Rehabilitation Precautions Restrictions:   Fall, left visual field cut    Patient Report: no pain  Pain: Patient currently without complaints of pain.  Pain Reassessment: Pain was not reassessed as no pain was reported.  Patient/Caregiver Goals:  to be able to go back to my routine    NEURO  Orientation/Awareness: Alert and Oriented x3.  Speech: Clear.  Behavior: Cooperative.    MEDICATIONS  IV Access: No IV access.  Dialysis Access: Patient does not have dialysis access.      Elopement Risk Level Assessment Tool  Patient Criteria: Patient is not capable of leaving the unit.  Assessment is not  applicable.      RISK ASSESSMENT FOR FALLS/INJURY    MENTAL STATUS CRITERIA:   0 - None identified.  MENTAL STATUS TOTAL: 0    AGE CRITERIA:   5 - < 84 years old  AGE TOTAL: 0    ELIMINATION CRITERIA:   3 - Toileting with Assistance.   ELIMINATION TOTAL: 3    HISTORY OF FALLS CRITERIA:   2 - Unknown History.  HISTORY OF FALLS TOTAL: 2    MEDICATIONS CRITERIA:   2 or more High Risk Medications (*see list below)   MEDICATIONS TOTAL: 2    PHYSICAL MOBILITY CRITERIA:   3 - Decreased balance reaction.   1 - Weakness/impaired physical mobility.   PHYSICAL MOBILITY TOTAL: 4    FALLS RISK ASSESSMENT TOTAL: 11    Patient's Fall Risk: TOTAL SCORE >10: High Risk    Falls Interventions: Clutter removed and clear path to BR.  Call bell, phone, glasses, etc within reach.  Hourly toileting/safety checks between 6am and 10pm, then every 2 hours.    NUTRITION  Diet:  Type: Regular. Regular. Cardiac.  Food Consistency: Regular.  Liquid Consistency:  Thin.      CARDIOVASCULAR     Bilateral lower extremities  Nail Bed Color: Pink.   No edema or redness present.  Pulses:   Apical Pulse: Regular. Strong. Rate is 63 .   Patient does not have a pacemaker.   Patient does not have a defibrillator.    CARDIOPULMONARY  Lung Sounds:   Upper lobes. Clear.   Lower lobes. Clear.  Type of Respirations: Regular.  Cough: No cough noted.  Respiratory Support: The patient does not require any respiratory support.  Respiratory Equipment: None. 95% room air    INTEGUMENTARY  Skin:  Temperature: Warm  Turgor: Normal for age  Moisture: Dry  Color of skin: Normal for Race/Ethnicity  Capillary Refill: Less than 3 seconds   Bruising: abdomen bruises  Wounds/Incisions: No wounds or incisions.  Braden Scale for Predicting Pressure Sore Risk: Sensory Perception: No  impairment  Moisture: Rarely moist  Activity: Walks occasionally  Mobility: No limitation  Nutrition: Adequate  Friction and Shear: No apparent problem  Braden Score: 21  Level of Risk: No risk (19-23). Will reassess every shift.    GASTROINTESTINAL  Abdomen: Soft.  Bowel Sounds:  Active  bowel sounds audible in all four quadrants.  Date of Last Bowel Movement:  11/25/14   No Problems/Complaints with Bowel Elimination Assessed.    GENITOURINARY  Current Bladder Pattern: Continent  Color:  Yellow   Patient denies problems with urination and/or catheter.    MUSCULOSKELETAL  Upper Body: left sided vision impairment, and left sided neglect  Lower Body: none    Functional Measures      TOILETING: Toileting Score = 7.  Patient is completely independent for  toileting. There are no activity limitations.    BLADDER MANAGEMENT - LEVEL OF ASSIST: Bladder Score = 7. Patient is completely  independent for bladder management. There are no activity limitations.    BLADDER ACCIDENTS THIS SHIFT:  0 . Patient has not had an accident this  assessment and did not require medications or devices.    BOWEL MANAGEMENT - LEVEL OF ASSIST: Bowel Score =  6.  Patient is modified  independent for bowel management.  Patient did not have bowel movement.  Medication/intervention was provided.    BOWEL ACCIDENTS THIS SHIFT: 0 . Patient has not had an accident, but used a  non-natural laxative.    Education Provided:    Education Provided: Safety issues and interventions. Fall protocol.       Audience: Patient.       Mode: Explanation.       Response: Verbalized understanding.    Discharge: Patient is not being discharged at this time.    Long Term Goals:  1. Patient will demo increase self awareness on weak/ affected  side in order  to prevent neglect. - Goal Not Met  2 weeks from 11/19/14  Short Term Goals:  1. Pt will initiate use of call ligh at least 50-75% during  the day before getting OOB. - Goal Not Met   2. Pt will direct safe OOB transferes with Minimum Assistant - Goal Not Met   3. Family will be able to participate with ADLs, and toileting assistance at  least during the day.  7 days from 11/19/14    PROGRESS TOWARD GOALS: Pt is Independent to walk in room.    PLAN: Nursing Specific Interventions  self awareness on effected side in order to prevent neglect  Continue with the current Nursing Plan of Care.    TEAM CARE PLAN  Identified problems from team documentation:  Problem: Impaired Bladder Management    Problem: Impaired Bowel Management    Problem: Impaired Cognition  Cognition: Primary Team Goal: Patient will use strategies to compensate for lL  neglect in simple reading/writing/info management with no cues 75% of the  time./Active    Problem: Impaired Communication  Communication: Primary Team Goal: Pt will monitor for clarity, relevancy and  organization to convey mildy complex information (e.g. give directions to his  home) with occasional reminder (< 2X in 10 minute conversation)/Active    Problem: Impaired Leisure Skills  Leisure Skills: Primary Team Goal: Pt will demonstrate appropriate safety  awareness, mobility, and cognition in order to  participate in a leisure activity  of interest at mod I level at discharge/Active    Problem: Impaired Mobility  Mobility: Primary Team Goal: Patient will ambulate household distances (50')  independently and ascend/descend 12 steps with 1 HR with modified independence  to facilitate safe return to home environment./Active    Problem: Impaired Pain Management    Problem: Impaired Psychosocial Skills/Behavior  PsychoSocial: Primary Team Goal: Pt. will improve adjustment to medical  condition and rehab process  to function at the min A level/Active  PsychoSocial: Additional Team Goal:  Pt. and family will process ed re CVA,  including any psych/adjustment implications and return to work issues as  evidenced by verbalization /    Problem: Impaired Self-care Mgmt/ADL/IADL  Self Care: Primary Team Goal: Pt will complete morning routine with supervision  provided by wife./Active    Problem: Safety Risk and Restraint  Safety: Primary Team Goal: self awareness of effected side in order to prevent  neglect/    Add/Update Problems from this assessment:  No updates at this time.    Please review Integrated Patient View Care Plan Flowsheet for Team identified  Problems, Interventions, and Goals.    Signed by: Sherlyn Lick, RN 11/25/2014 9:00:00 PM

## 2014-11-26 NOTE — Rehab Discharge Summary ([facility]) (Addendum)
Corrected 11/26/2014 4:24:49 PM    NAME: Blake [name]  MRN: [id number]  Account: [id number]  Session Start: 11/26/2014 3:00:00 PM  Session Stop: 11/26/2014 4:00:00 PM    Physical Therapy  Inpatient Rehabilitation Discharge Summary and Note    Rehab Diagnosis: CVA (Right Brain)  Demographics:            Age: 63Y            Gender: Male  Primary Language: [place]  Past Medical History: Hypertension and hyperlipidemia  History of Present Illness: per HandP: The patient is a 63 year old right-hand  dominant gentleman who was admitted  to [facility] on November 15, 2014, with left visual field  cut as well as left-sided sensory loss and poor coordination. MRI of the  brain revealed a right-sided occipital stroke. A few days prior to  admission, he started experiencing headaches and left-sided numbness and  decreased visual field. 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 He has been started on 325 mg of aspirin a day  as well as 20 mg of Crestor.   Date of Onset: 11/15/14   Date of Admission: 11/19/2014 6:50:00 PM  Rehabilitation Precautions/Restrictions:   Fall, left visual field cut    SUBJECTIVE  Patient Report: Patient and wife are agreeable to transition into [facility] program  after discharge from inpatient rehabilitation.  Pain: Patient currently without complaints of pain.    OBJECTIVE    Bed Mobility: Independent with all aspects of bed mobility    Refer to Functional Measures for remainder of functional mobility status.    Functional Measures      TRANSFERS BED/CHAIR/WHEELCHAIR: Bed/chair/wheelchair Transfer Score = 7.  Patient is completely independent for transferring to and from the  bed/chair/wheelchair. There are no activity limitations.    LOCOMOTION WHEELCHAIR:   Wheelchair did not occur. Patient fully ambulatory    LOCOMOTION WALK:   Walk Distance Scale = 3.  Distance walked is greater than 150 feet. Walk Score  = 7.  Patient is completely independent  for walking.  There are no activity  limitations. Patient walked a distance of  1000 feet.    LOCOMOTION STAIRS: Stairs Score = 7.  Patient is completely independent for  negotiating stairs.  There are no activity limitations. Patient negotiated 12  stairs.    Today's Treatment Interventions:       Therapeutic Activities:  Session focused on reassessing functional mobility  in preparation for discharge. Patient was reassessed on Dynamic Gait  Index-please see results below. Patient ascended/descended 20 steps in a row  with 1 HR, then with no HR independently without LOB. Patient is ambulating  >1000' at a time without AD independently with proper strategies used for left  visual field cut. Patient does still show some dynamic balance deficits in  distracting and unfamiliar environments but is able to maintain and regain  balance without outside intervention. Patient was able to get up off of the  floor without needing anything to push up on independently.    ASSESSMENT  Summary of Progress and Current Status: Blake [name] is now independent for bed  mobility, transfers, gait, and stairs. He has improved in dynamic balance but  still shows some deficits with busy and unfamiliar environments. He will benefit  from high level outpatient treatment to continue working on attention, dual  cognitive tasking, dynamic balance, and coordination.    Previously Documented Mode of Locomotion at Discharge: Walk  PAI  Expected Mode of Locomotion at Discharge: No change to the current  documented expected, most frequently used mode of locomotion at discharge    Progress Toward Goals (final status):   LONG TERM GOAL REVIEW:       1. Patient will be independent for bed mobility and transfers to facilitate  safe return to home environment. - Met       2. Patient will be independent to ambulate short distances within house  (approx50') to facilitate safe home mobility. - Met       3. Patient will require supervision for ambulation  longer distances (up to  1000') in distracting and crowded environments with minimal cueing required for  obstacle negotiation to facilitate safe return to community. - Met       4. Patient will ascend/descend 12 steps with 1 HR with modified  independence to facilitate safely reaching bedroom/bathroom upstairs. - Met       5. Patient will require cues no more than 25% of the time to scan his  environment to compensate for left field cut to facilitate safe return to home  and community. - Met    PLAN  Physical Therapy Plan: Patient is recommended for outpatient therapy services.    Team Care Plan  Please review Integrated Patient View Care Plan Flowsheet for Team identified  Problems, Interventions, and Goals.    Identified problems from team documentation:  Problem: Impaired Bladder Management    Problem: Impaired Bowel Management    Problem: Impaired Cognition  Cognition: Primary Team Goal: Patient will use strategies to compensate for lL  neglect in simple reading/writing/info management with no cues 75% of the  time./Met    Problem: Impaired Communication  Communication: Primary Team Goal: Pt will monitor for clarity, relevancy and  organization to convey mildy complex information (e.g. give directions to his  home) with occasional reminder (< 2X in 10 minute conversation)/Not Met    Problem: Impaired Leisure Skills  Leisure Skills: Primary Team Goal: Pt will demonstrate appropriate safety  awareness, mobility, and cognition in order to participate in a leisure activity  of interest at mod I level at discharge/Met    Problem: Impaired Mobility  Mobility: Primary Team Goal: Patient will ambulate household distances (50')  independently and ascend/descend 12 steps with 1 HR with modified independence  to facilitate safe return to home environment./Active    Problem: Impaired Pain Management    Problem: Impaired Psychosocial Skills/Behavior  PsychoSocial: Primary Team Goal: Pt. will improve adjustment to  medical  condition and rehab process to function at the min A level/Active  PsychoSocial: Additional Team Goal:  Pt. and family will process ed re CVA,  including any psych/adjustment implications and return to work issues as  evidenced by verbalization /    Problem: Impaired Self-care Mgmt/ADL/IADL  Self Care: Primary Team Goal: Pt will complete morning routine with supervision  provided by wife./Met    Problem: Safety Risk and Restraint  Safety: Primary Team Goal: self awareness of effected side in order to prevent  neglect/    Status update for discharge:     Mobility:   Primary Goal: Met    3 Hour Rule Minutes: 60 minutes of PT treatment this session count towards  intensity and duration of therapy requirement. Patient was seen for the full  scheduled time of PT treatment this session.  Therapy Mode Minutes: Individual: 60 minutes.    Signed by: Malachy Mood, PT 11/26/2014 4:00:00 PM

## 2014-11-26 NOTE — Rehab Progress Note ([facility]) (Signed)
NAMEKAMDON REISIG  MRN: [id number]  Account: [id number]  Session Start: 11/26/2014 12:00:00 AM  Session Stop: 11/26/2014 12:00:00 AM    SEVERE SEPSIS SCREEN  INFECTION:  Patient has no indication of infection.  Negative Sepsis Screen.  If you are unable to assess a system's dysfunction because you do not have labs,  or the labs you have are not current (within 24 hours), call physician and  request and order for the lab tests needed.    Signed by: Parks Neptune, RN 11/26/2014 8:30:00 PM

## 2014-11-26 NOTE — Rehab Discharge Summary ([facility]) (Signed)
Blake [name]  MRN: [id number]  Account: [id number]  Session Start: 11/26/2014 9:00:00 AM  Session Stop: 11/26/2014 10:00:00 AM    Speech Language Pathology  Inpatient Rehabilitation  Language Cognitive and Dysphagia Discharge Summary and Note    Rehab Diagnosis: CVA (Right Brain)  Demographics:            Age: 63Y            Gender: Male  Primary Language: [place]    Past Medical History: Hypertension and hyperlipidemia  History of Present Illness: per HandP: The patient is a 63 year old right-hand  dominant gentleman who was admitted  to [facility] on November 15, 2014, with left visual field  cut as well as left-sided sensory loss and poor coordination. MRI of the  brain revealed a right-sided occipital stroke. A few days prior to  admission, he started experiencing headaches and left-sided numbness and  decreased visual field. 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 He has been started on 325 mg of aspirin a day  as well as 20 mg of Crestor.   Date of Onset: 11/15/14   Date of Admission: 11/19/2014 6:50:00 PM  Premorbid Functional Level: Pt independent w/ ADLs, IADLs, functional mobility,  drives. Currently practices as neurologist  Social/Educational History: Patient is a neurologist.  He lives in [place]  with his supportive wife.  Their son, who has boy and girl, lives in [place]  as well.  Their daughter with her 2 boys lives in [place].  Home Environment: Patient lives with wife in two level home, 5 STE with 1 HR,  and 17 steps up to second floor where main bedroom and bathroom are located with  1 HR. Per wife, patient can stay on first floor in guest bedroom if needed. Wife  will be present to assist patient as much as needed.    Rehabilitation Precautions/Restrictions:   Fall, left visual field cut    SUBJECTIVE  Patient Report: "This is a little bit confusing because the numbers are going  down." (re: pages in upside down book)  Pain:  Patient currently without complaints of pain.      OBJECTIVE  Functional Measures      COMPREHENSION: Auditory comprehension is the usual mode. Comprehension Score =  7, Independent.  Patient comprehends complex/abstract information in their  primary language.  Patient is completely independent for auditory comprehension.   There are no activity limitations.    EXPRESSION: Vocal expression is the usual mode. Expression Score = 6, Modified  Independent.  Patient expresses complex/abstract information in their primary  language with only mild difficulty with tasks.    SOCIAL INTERACTION: Social Interaction Score = 6, Modified Independent.  Patient  is modified independent for social interaction, requiring: Requires a structured  or modified environment.    PROBLEM SOLVING: Patient does not make appropriate decisions in order to solve  complex problems without assistance from a helper. Problem Solving Score = 4,  Minimal Direction. Patient makes appropriate decisions in order to solve routine  problems 75-90% of the time. Patient requires minimal/occasional direction for  the following behavior(s):    MEMORY: Memory Score = 6, Modified Independence.  Patient is modified  independent for memory, having only mild difficulty and using self-initiated or  environmental cues to remember. The patient's memory is mildly impacted by    Current Diet: (cardiac) regular solids/thin liquids    Today's Treatment Interventions:  Speech Treatment: Targeted visual scanning strategies for reading book and  scanning functional info.  Reading book: needed verbal prompt to recognize book was upside down; then able  to use scanning strategies to read paragraph without difficulty (one error,  self-corrected)  Functional info: reading bank sign with one error, self-corrected  Lumosity: divided attention and problem solving, needed mod A to acquire task  set    Education Provided:    Education Provided: Cognitive functioning.        Audience: Patient.       Mode: Explanation.       Response: Verbalized understanding.    ASSESSMENT  Summary of Progress and Current Status:  Pt presents with mild-moderate right hemispheric dysfunction with a L  inattention and field cut. He has learned compensations well and is able to  employ them for reading paragraphs and functional scanning. Other cognitive  areas in executive function, attention and perception are also mildly impaired  with compounding deficits in visual perception impacting information processing  for problem solving.  Pt continues to need structure, cues and feedback for clear, concise verbal  communication. Vast medical knowledge and baseline personality (verified by  wife) predispose pt to verbosity. Recent impairments related to CVA  limit his  ability to monitor and adapt to fit constraints.    Recommend continued SLP via outpatient to address cognitive-communication  deficits.      Progress Toward Goals (final status):   SHORT TERM GOAL REVIEW:       1. Pt will utilize edging strategies to compensate for neglect in simple  reading, writing and information management (e.g. filling out a form) with min  cues. - Met       2. Pt will monitor for clarity, relevancy and organization to convey mildly  complex information (e.g. give directions to his home) with mod cues. - Met   LONG TERM GOAL REVIEW:       1. 1. Pt will utilize edging strategies to compensate for neglect in simple  reading, writing and information management (e.g. filling out a form) with no  cues 75% of the time - Met       2. 2. Pt will monitor for clarity, relevancy and organization to convey  mildly complex information (e.g. give directions to his home) with occasional  reminder (< 2X in 10 minute conversation) - Not Met:    PLAN  Speech Pathology Plan: Patient is recommended for outpatient therapy services.    Team Care Plan  Please review Integrated Patient View Care Plan Flowsheet for Team identified  Problems,  Interventions, and Goals.    Identified problems from team documentation:  Problem: Impaired Bladder Management    Problem: Impaired Bowel Management    Problem: Impaired Cognition  Cognition: Primary Team Goal: Patient will use strategies to compensate for lL  neglect in simple reading/writing/info management with no cues 75% of the  time./Active    Problem: Impaired Communication  Communication: Primary Team Goal: Pt will monitor for clarity, relevancy and  organization to convey mildly complex information (e.g. give directions to his  home) with occasional reminder (< 2X in 10 minute conversation)/Active    Problem: Impaired Leisure Skills  Leisure Skills: Primary Team Goal: Pt will demonstrate appropriate safety  awareness, mobility, and cognition in order to participate in a leisure activity  of interest at mod I level at discharge/Active    Problem: Impaired Mobility  Mobility: Primary Team Goal: Patient will ambulate household distances (50')  independently and  ascend/descend 12 steps with 1 HR with modified independence  to facilitate safe return to home environment./Active    Problem: Impaired Pain Management    Problem: Impaired Psychosocial Skills/Behavior  PsychoSocial: Primary Team Goal: Pt. will improve adjustment to medical  condition and rehab process to function at the min A level/Active  PsychoSocial: Additional Team Goal:  Pt. and family will process ed re CVA,  including any psych/adjustment implications and return to work issues as  evidenced by verbalization /    Problem: Impaired Self-care Mgmt/ADL/IADL  Self Care: Primary Team Goal: Pt will complete morning routine with supervision  provided by wife./Met    Problem: Safety Risk and Restraint  Safety: Primary Team Goal: self awareness of effected side in order to prevent  neglect/    Status update for discharge:     Cognition:   Primary Goal: Met     Communication:   Primary Goal: Not Met    3 Hour Rule Minutes: 60 minutes of SLP treatment  this session count towards  intensity and duration of therapy requirement. Patient was seen for the full  scheduled time of SLP treatment this session.  Therapy Mode Minutes: Individual: 60 minutes.    Signed by: Ladell Heads, M.A., CCC-SLP 11/26/2014 10:00:00 AM

## 2014-11-26 NOTE — Rehab Discharge Summary ([facility]) (Signed)
Blake [name]  MRN: [id number]  Account: [id number]  Session Start: 11/26/2014 2:00:00 PM  Session Stop: 11/26/2014 3:00:00 PM    Therapeutic Recreation  Inpatient Rehabilitation Discharge Summary and Note    Rehab Diagnosis: CVA (Right Brain)  Demographics:            Age: 63Y            Gender: Male    Past Medical History: Hypertension and hyperlipidemia  History of Present Illness: per HandP: The patient is a 63 year old right-hand  dominant gentleman who was admitted  to [facility] on November 15, 2014, with left visual field  cut as well as left-sided sensory loss and poor coordination. MRI of the  brain revealed a right-sided occipital stroke. A few days prior to  admission, he started experiencing headaches and left-sided numbness and  decreased visual field. 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 He has been started on 325 mg of aspirin a day  as well as 20 mg of Crestor.            Date of Onset:  11/15/14            Date of Admission: 11/19/2014 6:50:00 PM    Rehabilitation Precautions/Restrictions:   Fall, left visual field cut    SUBJECTIVE  Patient Report: "That's the important thing with everything now- time."  Pain: Patient currently without complaints of pain.    OBJECTIVE  Leisure Participation:  Modified Independent for participation in leisure  activities.  Equipment Recommended: Leisure Equipment Recommended: reading, IPAD, computers,  familiar leisure tasks, cards and board games  Community Mobility: Patient Not assessed.    Functional Measures      SOCIAL INTERACTION: Social Interaction Score = 6, Modified Independent.  Patient  is modified independent for social interaction, requiring: Requires additional  time.    Interventions: Adapted Leisure Skills. Discharge Planning. Community  Functioning. Therapeutic Activities. Leisure Counseling/Education. The following  treatment was provided:  Socialization/Psychosocial: Pt provided  socialization and support in-room s/p  episode of orthostatic hypotension at start of session and pts friend visiting.  Pt identified appropriate strategies for continued leisure participation  postdischarge with support of family as needed.  Pain Reassessment: Pain was not reassessed as no pain was reported.    Education Provided:    Education Provided: Benefit and value of leisure activity.       Audience: Patient.       Mode: Explanation.       Response: Applied knowledge.  Verbalized understanding.    ASSESSMENT  Summary of Deficits and Recommended Follow-up: Pt has benefitted from  socialization and support, leisure education, and participation in leisure tasks  of interest including IPAD to address L inattention, safety, cognition, word  finding, and mobility in preparation for community re-entry. Pt demonstrates  good understanding for continued leisure participation postdicsharge.  Progress Toward Goals: Pt is mod I for familiar leisure activities. Pt often  observed in-room with self-directed leisure activities including reading and  using Iphone to communicate with family.  Pt received reclined in bed asleep, arousable to therapist's entrance. Pt  performed bed mobility and sit to stand independently. When in standing pt  started to feel dizzy and had a slight LOB, corrected with CGA from therapist,  and guided back to bed. Nsg informed. Pt requested to stay in bed "to resolve  blood pressue issue", visiting with friend who just arrived. Pt visited after  several minutes by this therapist, engaging in conversation in-room re upcoming  discharge, benefits and values of continued leisure and social activities and  strategies for continued participation. Pts wife arrived at end of session, also  educated on TR role, services, and continued benefits of leisure activities.    PLAN  Therapeutic Recreation services are not recommended at this time due to: No need  for skilled therapy intervention at this  time.    Care Plan  Identified problems from team documentation:  Problem: Impaired Bladder Management    Problem: Impaired Bowel Management    Problem: Impaired Cognition  Cognition: Primary Team Goal: Patient will use strategies to compensate for lL  neglect in simple reading/writing/info management with no cues 75% of the  time./Met    Problem: Impaired Communication  Communication: Primary Team Goal: Pt will monitor for clarity, relevancy and  organization to convey mildy complex information (e.g. give directions to his  home) with occasional reminder (< 2X in 10 minute conversation)/Not Met    Problem: Impaired Leisure Skills  Leisure Skills: Primary Team Goal: Pt will demonstrate appropriate safety  awareness, mobility, and cognition in order to participate in a leisure activity  of interest at mod I level at discharge/Active    Problem: Impaired Mobility  Mobility: Primary Team Goal: Patient will ambulate household distances (50')  independently and ascend/descend 12 steps with 1 HR with modified independence  to facilitate safe return to home environment./Active    Problem: Impaired Pain Management    Problem: Impaired Psychosocial Skills/Behavior  PsychoSocial: Primary Team Goal: Pt. will improve adjustment to medical  condition and rehab process to function at the min A level/Active  PsychoSocial: Additional Team Goal:  Pt. and family will process ed re CVA,  including any psych/adjustment implications and return to work issues as  evidenced by verbalization /    Problem: Impaired Self-care Mgmt/ADL/IADL  Self Care: Primary Team Goal: Pt will complete morning routine with supervision  provided by wife./Met    Problem: Safety Risk and Restraint  Safety: Primary Team Goal: self awareness of effected side in order to prevent  neglect/    Status update for discharge:     Leisure Skills:   Primary Goal: Met    Please review Integrated Patient View Care Plan Flowsheet for Team identified  Problems,  Interventions, and Goals.    Signed by: Conception Oms, CTRS 11/26/2014 3:00:00 PM

## 2014-11-26 NOTE — Rehab Discharge Summary ([facility]) (Signed)
Blake [name]  MRN: [id number]  Account: [id number]  Session Start: 11/26/2014 10:00:00 AM  Session Stop: 11/26/2014 11:00:00 AM    Occupational Therapy  Inpatient Rehabilitation Discharge Summary and Note    Rehab Diagnosis: CVA (Right Brain)  Demographics:            Age: 63Y            Gender: Male    Past Medical History: Hypertension and hyperlipidemia  History of Present Illness: per HandP: The patient is a 63 year old right-hand  dominant gentleman who was admitted  to [facility] on November 15, 2014, with left visual field  cut as well as left-sided sensory loss and poor coordination. MRI of the  brain revealed a right-sided occipital stroke. A few days prior to  admission, he started experiencing headaches and left-sided numbness and  decreased visual field. 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 He has been started on 325 mg of aspirin a day  as well as 20 mg of Crestor.   Date of Onset: 11/15/14   Date of Admission: 11/19/2014 6:50:00 PM  Rehabilitation Precautions/Restrictions:   Fall, left visual field cut    SUBJECTIVE  Patient Report: "I look forward to continuing my rehabilitation as an  outpatient. It will be nice to be home to spend time with  my grandchildren for  Christmas."  Pain: Patient currently without complaints of pain.    OBJECTIVE  General Observation: Pt appeared comfortable, seated on bed speaking with SLP  Heather upon initiation of tx. AandOx4. No complaints of pain. Continued to  increase application of compensatory strategies for L visual field cut during  session.  Vitals:                       Current Value                Previous Value  Vitals  BP Systolic          -                            161/91  Pulse                -                            62  Respirations         -                            18    Cognition: Pt with improved ability to divide attention between multiple tasks  this session. Minimal to mod  cues required for consistent application of  compensatory strategies for L visual field cut/neglect. Demonstrates good  utilization of problem solving strategies when attempting to navigate unfamiliar  surroundings ie; identifying room numbers, floor number, etc. Continues to have  intermittent tangential speech requiring redirection.  Perception: L visual field cut with L neglect, improving with use of  compensatory strategies.    Functional Measures    EATING: Eating Score = 7. Patient is completely independent for eating.  There  are no activity limitations.    GROOMING: Grooming Score = 6.  Patient is modified independent for grooming,  requiring: Safety considerations 2/2 L neglect .    BATHING:  Patient bathed in shower. Bathing Score = 5.  Patient is  supervision/set-up for bathing, requiring: Standing by.  Patient requires the following assistive device(s):  Completes bathing standing  in shower with intermittent stand by assist for safety 2/2 safety considerations  on wet surface    UPPER BODY DRESSING: Upper Body Dressing Score = 6 Patient is modified  independent for upper body dressing, requiring: Safety considerations. Safety  considerations to retrieve items from closet 2/2 visual deficits    LOWER BODY DRESSING: Lower Body Dressing  Score = 6. Patient is modified  independent for lower body dressing, requiring: Safety considerations. Safety  considerations to retrieve items from closet 2/2 visual deficits    TOILETING: Toileting Score = 6.  Patient is modified independent for toileting,  requiring: Safety considerations.    TRANSFER TOILET: Toilet Transfer Score = 6.  Patient is modified independent for  transferring to and from the toilet/commode, requiring: Safety considerations.  Safety considerations to perform gait level tfr to/from standard toilet 2/2  visual deficits    TRANSFER SHOWER: Shower Transfer Score = 5.  Patient is supervision/set-up for  transferring to and from the shower, requiring:  Stand by assistance.  Verbal cuing, prompting, or instructing.  Shower seat as needed Completed gait  level tfr to/from walk-in shower with stand by assist and min cues for safety on  wet surface 2/2 L visual deficits    Today's Treatment Interventions:       Therapeutic Activities:  See IRF FIM for therapeutic activities performed.    Pt also completed IADL activity of shopping at gait level in training grocery  store environment. Completed item retreival from high and low shelves focusing  on scanning and head turn towards L side as well as dynamic balance. Performed  gait level mobility through hallways with L UE outstretched to identify distance  from wall while focusing on maintaining normal gait pattern to avoid LOB in prep  for household mobility.  Equipment Provided/Ordered: Reviewed recommendations for shower chair for seated  rest breaks; pt reports having shower chair at his son's home where he will be  returning to after discharge. No further OT DME needed at this time.    Education Provided:    Education Provided: Safety issues and interventions. Impaired vision. Activities  of daily living. Bed mobility. Functional transfers. Stroke.  Supervision/assistance requirements       Audience: Patient.       Mode: Explanation.  Demonstration.       Response: Applied knowledge.  Verbalized understanding.  Demonstrated skill.  Needs practice.    ASSESSMENT  Summary of Progress and Current Status: Pt demonstrates improving L side  attention and use of compensatory strategies for visual field cut deficits. Pt  able to perform self-care routine with mod I with exception of shower 2/2 safety  risks on wet surface. Pt's wife safe to supervise as needed and will provide  24/7 supv at home after discharge. Recommend OP Bridge services to increase  safety and independence.    Long Term Goals (status prior to discharge): Pt will complete gait level  transfers to/from toilet with supervision and use of LRD DME for safety  with  minimal cues for attention to L side  Pt will initate scanning of environment towards L side with minimal cues in prep  for household navigation.  Pt will complete self-care routine of upper/lower body dressing and grooming  tasks with mod I for safety considerations in prep for attending appointments.  Pt will complete bathing tasks and transfer to/from shower with LRD DME, minimal  cues, and supervision in prep for visiting with grandchildren.  7-10 days  Short Term Goals (status prior to discharge):    Progress Towards Goals (final status):   LONG TERM GOAL REVIEW:       1. Pt will complete gait level transfers to/from toilet with supervision  and use of LRD DME for safety with minimal cues for attention to L side - Met  Completes gait level transfers with mod I and use of compensatory strategies to  increase L side awareness       2. Pt will initate scanning of environment towards L side with minimal cues  in prep for household navigation. - Met Improving use of scanning in familiar  environments        3. Pt will complete self-care routine of upper/lower body dressing and  grooming tasks with mod I for safety considerations in prep for attending  appointments. - Met       4. Pt will complete bathing tasks and transfer to/from shower with LRD DME,  minimal cues, and supervision in prep for visiting with grandchildren. - Met  Pt's wife able to supervise as needed    PLAN  Occupational Therapy Plan: Outpatient Bridge Program    Team Care Plan  Please review Integrated Patient View Care Plan Flowsheet for Team identified  Problems, Interventions, and Goals.    Identified problems from team documentation:  Problem: Impaired Bladder Management    Problem: Impaired Bowel Management    Problem: Impaired Cognition  Cognition: Primary Team Goal: Patient will use strategies to compensate for lL  neglect in simple reading/writing/info management with no cues 75% of the  time./Active    Problem: Impaired  Communication  Communication: Primary Team Goal: Pt will monitor for clarity, relevancy and  organization to convey mildy complex information (e.g. give directions to his  home) with occasional reminder (< 2X in 10 minute conversation)/Active    Problem: Impaired Leisure Skills  Leisure Skills: Primary Team Goal: Pt will demonstrate appropriate safety  awareness, mobility, and cognition in order to participate in a leisure activity  of interest at mod I level at discharge/Active    Problem: Impaired Mobility  Mobility: Primary Team Goal: Patient will ambulate household distances (50')  independently and ascend/descend 12 steps with 1 HR with modified independence  to facilitate safe return to home environment./Active    Problem: Impaired Pain Management    Problem: Impaired Psychosocial Skills/Behavior  PsychoSocial: Primary Team Goal: Pt. will improve adjustment to medical  condition and rehab process to function at the min A level/Active  PsychoSocial: Additional Team Goal:  Pt. and family will process ed re CVA,  including any psych/adjustment implications and return to work issues as  evidenced by verbalization /    Problem: Impaired Self-care Mgmt/ADL/IADL  Self Care: Primary Team Goal: Pt will complete morning routine with supervision  provided by wife./Active    Problem: Safety Risk and Restraint  Safety: Primary Team Goal: self awareness of effected side in order to prevent  neglect/    Status update for discharge:     Self Care Management:   Primary Goal: Met    3 Hour Rule Minutes: 60 minutes of OT treatment this session count towards  intensity and duration of therapy requirement. Patient was seen for the full  scheduled time of OT treatment this session.  Therapy Mode Minutes: Individual: 60 minutes.    Signed  by: Murlean Caller, OTR/L 11/26/2014 11:00:00 AM

## 2014-11-27 MED ORDER — LOSARTAN POTASSIUM 50 MG PO TABS
50.00 mg | ORAL_TABLET | Freq: Every day | ORAL | Status: DC
Start: 2014-11-27 — End: 2014-12-17

## 2014-11-27 MED ORDER — ASPIRIN 81 MG PO CHEW
324.00 mg | CHEWABLE_TABLET | Freq: Every day | ORAL | Status: AC
Start: 2014-11-27 — End: ?

## 2014-11-27 MED ORDER — ROSUVASTATIN CALCIUM 20 MG PO TABS
20.00 mg | ORAL_TABLET | Freq: Every evening | ORAL | Status: DC
Start: 2014-11-27 — End: 2014-12-17

## 2014-11-27 MED ORDER — AMLODIPINE BESYLATE 5 MG PO TABS
5.00 mg | ORAL_TABLET | Freq: Every day | ORAL | Status: DC
Start: 2014-11-27 — End: 2014-12-17

## 2014-11-27 MED ORDER — ASPIRIN 81 MG PO CHEW
324.00 mg | CHEWABLE_TABLET | Freq: Every day | ORAL | Status: DC
Start: 2014-11-27 — End: 2014-11-27

## 2014-11-27 NOTE — Discharge Summary (Signed)
Discharge Date: 11/27/2014     ATTENDING PHYSICIAN:  Candie Echevaria, MD     DATE OF ADMISSION:  November 19, 2014.     DISCHARGE DATE:  November 27, 2014.     DISCHARGE DIAGNOSES:  1.  Dysfunction of mobility and activities of daily living secondary to  left hemiparesis caused by right occipital ischemic stroke.  2.  Atherosclerotic cerebrovascular disease.  3.  Hypertension.  4.  Hyperlipidemia.  5.  Left visual field cut.     HISTORY OF PRESENT ILLNESS:  The patient is a 63 year old, right-hand dominant gentleman, who was  admitted to [facility] on November 15, 2014, with left  visual field cut as well as left-sided sensory loss and poor motor  coordination.  MRI of the brain revealed a right-sided occipital stroke. A  few days prior to admission, he started experiencing headaches and  left-sided numbness and left visual field cut.  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  He was started on 325 mg  of aspirin a day as well as 20 mg of Crestor.  The goal for his blood  pressure was to maintain it with 120 to 150 systolic.     ALLERGIES:   He has no known drug allergies.     PAST MEDICAL HISTORY, SOCIAL HISTORY, FAMILY HISTORY, REVIEW OF SYSTEMS,  AND PHYSICAL EXAMINATION ON ADMISSION:  Please see admission H and P.     HOSPITAL COURSE:  [name] participated in all areas of therapy and made excellent gains,  the details of which are recorded in the chart.  At the time of discharge,  his functional status improved to modified independent level.  On the day  of discharge, I discussed with the patient and his wife the details of his  status, prognosis, precautions, and discharge plans at length.  All  questions were answered to their satisfaction.  Imaging studies results  including MR angiography, CT angiogram and ultrasounds of the carotids were  discussed and reviewed.  The recommendation is for him to see a vascular  surgeon locally as well as one at [facility] prior  to decision regarding surgical intervention versus continuation of medical  therapy.     DISCHARGE MEDICATIONS:  Were reviewed, including potential benefits and side effects.   Prescriptions were provided.  He will go home on amlodipine 5 mg p.o.  daily, aspirin 325 mg p.o. daily, Pepcid 20 mg p.o. b.i.d., losartan 50 mg  p.o. daily, Crestor 20 mg p.o. daily.     REVIEW OF SYSTEMS:   On the day of discharge, the patient denied any chest pain, shortness of  breath, cough, nausea, diarrhea, constipation, dysuria, frequency, urgency,  fever, chills, headaches, significant change in appetite.     PHYSICAL EXAMINATION:   On the day of discharge:  LUNGS:  Clear to auscultation without wheezing, rales, or rhonchi.  HEART:  Regular rhythm without any ectopy.  ABDOMEN:  Soft and nontender.  Bowel sounds were normal.  EXTREMITIES:  Without edema.  NEUROLOGIC:  He was alert and oriented x3.  Affects were appropriate.     Team rounds were held this afternoon.  Full report to follow.  I have  discussed the case with the patient's registered nurse, internal case  manager, and resident physician.     Total time spent on this discharge was 35 minutes.           D:  11/27/2014 17:07 PM by Dr. Eusebio Friendly, MD [phone])  T:  11/27/2014 19:59 PM by       Everlean Cherry: [id number]) (Doc ID: [id number])

## 2014-11-27 NOTE — Rehab Discharge Instruction ([facility]) (Signed)
Blake [name]  MRN: [id number]  Account: [id number]  Session Start: 11/27/2014 12:00:00 AM  Session Stop: 11/27/2014 12:00:00 AM    Case Management  Inpatient Rehabilitation Discharge Instructions    Discharge Plan: Discharge 11/27/14 to home.  To be transported home via car with  wife.    Outpatient physical, occupational, speech services at:  [facility] ([address]  [address]  [place], [place] [address]  Contact: Clayborne Dana, Education Coordinator  T: [phone]    Follow-up Appointment(s):  Within 1 week of discharge, followup with:  Dr. Pershing Cox, Internal Medicine  [address]  [address]  [place], [place] [address]  Phone: [phone]    Additional Information: Discharge instructions discussed with [name] and  his wife with verbalized agreement and understanding with the plan.    Signed by: Mariel Sleet, RN, BSN, CCM, ACM 11/27/2014 8:50:00 AM

## 2014-11-27 NOTE — Rehab Progress Note ([facility]) (Signed)
Blake [name]  MRN: [id number]  Account: [id number]  Session Start: 11/27/2014 12:00:00 AM  Session Stop: 11/27/2014 12:00:00 AM    Interaction: Patient is alert and able to verbalize his needs well. Assisted  with ADL care and remain in his room with his wife sitting at his bedside.Took  all hia AM meds and tolerated. D/C instructions including meds list with  prescriptions given to patient in his wife's presence. Oriented to F/U  appointments to be scheduled after D/C. Seen by MD prior to D/C. All personal  belongings packed. No complaints of missing items voiced. Denies any pain or  discomfort upon D/C. W/chair and escorted by a nursing staff to the front  parking area. Left at 12:20 PM in stable condition.    Signed by: Ranae Pila, RN 11/27/2014 12:30:00 PM

## 2014-11-27 NOTE — Rehab Evaluation ([facility]) (Signed)
[name]  MRN: [id number]  Account: [id number]  Session Start: 11/23/2014 12:00:00 AM  Session Stop: 11/23/2014 12:00:00 AM    Case Management  Inpatient Rehabilitation Initial Assessment    Rehab Diagnosis: CVA (Right Brain)  Demographics:            Age: 63Y            Gender: Male    Past Medical History: Hypertension and hyperlipidemia  History of Present Illness: The patient is a 63 year old right-hand dominant  gentleman who was admitted  to [facility] on November 15, 2014, with left visual field  cut as well as left-sided sensory loss and poor coordination. MRI of the  brain revealed a right-sided occipital stroke. A few days prior to  admission, he started experiencing headaches and left-sided numbness and  decreased visual field. 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 He has been started on 325 mg of aspirin a day  as well as 20 mg of Crestor.   Date of Onset: 11/15/14   Date of Admission: 11/19/2014 6:50:00 PM    Premorbid Functional Level: Patient reported Independent, driving  Understanding of Current Condition: Patient has fair understanding of functional  status. Patient is able to state that he is impaired by left visual field cut  and has trouble with obstacle negotiation, but has decreased insight into  supervision that is required, that he needs more training to adapt to vision  issues.  Patient/Caregiver Goals:  Patient's functional goals: "To be able to go back to  my routine"  Home Environment: Patient lives in a home environment. Patient lives with Wife:  [name] who is (are) able to assist patient at discharge. Patient lives  with wife in two level home, 5 STE with 1 HR,  and 17 steps up to second floor where main bedroom and bathroom are located with  1 HR. Per wife, patient can stay on first floor in guest bedroom if needed.  Primary Language: English    Demographics: (Source of HX, Pre-Hosp, Marital, Income,  Vocation, [place])  Source of History: Patient.  Pre-Hospital Living  Environment: Home.  Marital Status: Married.  Income Source:   Associate Professor.    Vocational Status: Employed.  Occupation: MD - Neurologist Full time.  Military Status: Patient's and/or spouse's military status is unknown.  Family Contact Information:  Wife: Kerry Kass: [phone]  C: [phone]    Son: Alexxander Kurt: [phone]  POA/Guardian Information:  Patient does not have a guardian.  Financial Concerns: None stated    Special Needs: None.  Observed Behaviors:  Cooperative.  Psychosocial History:   None.  Adjustment to Present Illness:  Patient is coping adequately.   Patient is accepting limitations adequately.   Patient's expectations are realistic.   Patient is motivated.  Patient Perceived Primary Stressors:   None stated :  Primary MD Contact: Primary MD Contacted: Dr. Pershing Cox, Internal Medicine  [address]  [place], [place] [address]  Phone: [phone]    Home Care/Long Term Care Policy: None.    Interdisciplinary Educational Needs and Learning Preferences:  Education not  assessed/provided this session.    Rehab Potential: Able to participate in an intensive inpatient interdisciplinary  rehabilitation program, Good family/social support, Good premorbid functional  status, Good premorbid medical status, Living in the community premorbidly,  Motivated  Barriers to Progress/Discharge: No potential barriers to progress.    Other/Additional  Findings: Discussed role of CM and the Acute Rehab process.  Team recommends [facility].  Referral placed w/Tera Jenkiins T:  [phone].  Anticipated start of care week of 12/03/14.    Continue CM services for support, UR, and Learned planning.    Medicare Important Message: The Medicare Important Message letter was not  issued. NA    Identified problems from this assessment:     No problems identified at this time.    Please review Integrated Patient View Care Plan  Flowsheet for Team identified  Problems, Interventions, and Goals.    Signed by: Mariel Sleet, RN, BSN, CCM, ACM 11/23/2014 8:45:00 AM

## 2014-11-27 NOTE — Progress Notes (Signed)
Date Time: 11/27/2014 2:11 PM  Patient Name: Blake Sheer, MD  Attending Physician: Eusebio Friendly, MD  Patient Class: Inpatient Rehab  Hospital Day: 8            NEUROLOGY PROGRESS NOTE             Assessment/Plan   Large right occipital and thalamic area strokes.  Diffuse atherosclerotic disease intracranially and extracranially.  Hyperlipidemia.  Hypertension  Left high-grade carotid stenosis-->80% range, seems higher on the CTA    Remain on aspirin and statin for stroke prevention  Outpatient vascular surgery evaluation for the left carotid stenosis      Subjective:   Patient Seen and Examined.  No new issues.  Doing better.    Review of Systems:   Pt too cognitively or communication impaired to participate in ROS.    Physical Exam:   BP 158/91 mmHg  Pulse 69  Temp(Src) 97.8 F (36.6 C) (Oral)  Resp 17  Ht 1.88 m (6\' 2" )  Wt 107.956 kg (238 lb)  BMI 30.54 kg/m2  SpO2 97%    Neuro:  Level of consciousness:  Alert and appropriate  Oriented:  X 3  Facial Movements: symmetric     Meds:      Scheduled Meds: PRN Meds:      amLODIPine 5 mg Oral Daily   aspirin 324 mg Oral Daily   docusate sodium 100 mg Oral BID   enoxaparin 30 mg Subcutaneous Q12H SCH   famotidine 20 mg Oral Q12H SCH   losartan 50 mg Oral Daily   rosuvastatin 20 mg Oral QHS       Continuous Infusions:      magnesium hydroxide 30 mL QHS PRN   meclizine 25 mg Q6H PRN   oxyCODONE 5 mg Q3H PRN               Labs:   No results for input(s): GLU, BUN, CREAT, CA, NA, K, CL, CO2, ALB, PHOS, MG, AST, ALT, BILITOTAL, ALKPHOS in the last 168 hours.    Invalid input(s): TBILI  No results for input(s): WBC, HGB, HCT, MCV, MCH, MCHC, PLT in the last 168 hours.      No results for input(s): PTT, PT, INR in the last 72 hours.       Radiology Results (24 Hour)     ** No results found for the last 24 hours. **           All brain imaging (MRI, CT) personally reviewed.    Case discussed with:pt and radiology     Signed by: Cathe Mons, MD  Spectralink:  [phone]      Answering Service: [phone]

## 2014-11-27 NOTE — Rehab Discharge Summary ([facility]) (Signed)
[name]  MRN: [id number]  Account: [id number]  Session Start: 11/27/2014 12:00:00 AM  Session Stop: 11/27/2014 12:00:00 AM    Rehabilitation Nursing  Inpatient Rehabilitation Discharge Summary    Rehab Diagnosis: CVA (Right Brain)  Demographics:            Age: 63Y            Gender: Male  Primary Language: English    Date of Onset:  11/15/14  Date of Admission: 11/19/2014 6:50:00 PM    Rehabilitation Precautions Restrictions:   Fall, left visual field cut    Discharge:  Patient discharged to:   Home  At discharge, the patient was discharged to live (with):  Family / Relatives.  Follow up providers include: Family.    VITAL SIGNS  Blood Pressure: 144/67 mmHg  Temperature:  degrees  Pulse: 63 beats per minute  Respirations: 18 breaths per minute  Pain: 4/10    WEIGHT and NUTRITION  Admission Weight: 238 pounds; Current Weight: 237.6pounds  Weight Change since Admit: Patient has lost 0.4 pounds since admission.  Food Consistency: Regular  Liquid Consistency:Thin    Patient Report: "Am doing well and looking forward to going home tomorrow"  Pain: Patient currently without complaints of pain.  Pain Reassessment: Pain was not reassessed as no pain was reported.  Patient/Caregiver Goals:  to be able to go back to my routine    SEVERE SEPSIS SCREEN  INFECTION:  Patient has no indication of infection.  Negative Sepsis Screen.  If you are unable to assess a system's dysfunction because you do not have labs,  or the labs you have are not current (within 24 hours), call physician and  request and order for the lab tests needed.    NEURO  Orientation/Awareness: Alert and Oriented x4.  Speech: Clear.  Behavior: Cooperative.    MEDICATIONS  IV Access: No IV access.  Dialysis Access: Patient does not have dialysis access.    Elopement Risk Level Assessment Tool  Patient Criteria: Patient is not capable of leaving the unit.  Assessment is not  applicable.    RISK ASSESSMENT FOR FALLS/INJURY    MENTAL STATUS CRITERIA:    0 - None identified.  MENTAL STATUS TOTAL: 0    AGE CRITERIA:   63 - 4-33 years old  AGE TOTAL: 1    ELIMINATION CRITERIA:   3 - Toileting with Assistance.   ELIMINATION TOTAL: 3    HISTORY OF FALLS CRITERIA:   2 - Unknown History.  HISTORY OF FALLS TOTAL: 2    MEDICATIONS CRITERIA:   1 High Risk Medication (*see list below)   MEDICATIONS TOTAL: 1    PHYSICAL MOBILITY CRITERIA:   1 - Weakness/impaired physical mobility.   3 - Decreased balance reaction.   PHYSICAL MOBILITY TOTAL: 4    FALLS RISK ASSESSMENT TOTAL: 11    Patient's Fall Risk: No Risk Level found for this score.    Falls Interventions: Clutter removed and clear path to BR.  Call bell, phone, glasses, etc within reach.  Pt and family education.  Reoriented PRN.    NUTRITION  Diet:  Type: Regular.  Food Consistency: Regular.  Liquid Consistency: Thin.    CARDIOVASCULAR     Bilateral lower extremities  Nail Bed Color: Pink.   No edema or redness present.  Pulses:   Apical Pulse: Regular. Strong. Rate is 65 .   Patient does not have a pacemaker.   Patient does not have a defibrillator.  CARDIOPULMONARY  Lung Sounds:   Upper lobes. Clear.   Lower lobes. Clear.  Type of Respirations: Regular.  Cough: No cough noted.  Respiratory Support: The patient does not require any respiratory support.  Respiratory Equipment: None. Sat 95% R/A    INTEGUMENTARY  Skin:  Temperature: Warm  Turgor: Normal for age  Moisture: Dry  Color of skin: Normal for Race/Ethnicity  Capillary Refill: Less than 3 seconds  Wounds/Incisions: No wounds or incisions.  Braden Scale for Predicting Pressure Sore Risk: Sensory Perception: No  impairment  Moisture: Rarely moist  Activity: Walks occasionally  Mobility: Slightly limited  Nutrition: Adequate  Friction and Shear: No apparent problem  Braden Score: 20  Level of Risk: No risk (19-23). Will reassess every shift.    GASTROINTESTINAL  Abdomen: Soft. Nontender.  Bowel Sounds:  Active bowel sounds audible in all four quadrants.  Date of  Last Bowel Movement:  11/25/14   No Problems/Complaints with Bowel Elimination Assessed.    GENITOURINARY  Current Bladder Pattern: Continent  Color:  Yellow   Patient denies problems with urination and/or catheter.    MUSCULOSKELETAL  Upper Body: left sided vision impairment, and left sided neglect  Lower Body: none    Functional Measures      TOILETING: Toileting Score = 6.  Patient is modified independent for toileting,  requiring: Adaptive device to maintain balance.  Grab bar.    BLADDER MANAGEMENT - LEVEL OF ASSIST: Bladder Score = 7. Patient is completely  independent for bladder management. There are no activity limitations.    BLADDER ACCIDENTS THIS SHIFT:  0 . Patient has not had an accident this  assessment and did not require medications or devices.    BOWEL MANAGEMENT - LEVEL OF ASSIST: Bowel Score = 6.  Patient is modified  independent for bowel management.  Patient did not have bowel movement.  Medication/intervention was provided.    BOWEL ACCIDENTS THIS SHIFT: 0 . Patient has not had an accident, but used a  stool softener. Milk of Mag.    Education Provided:    Education ProvidedNetwork engineer. Pain management.       Audience: Patient.       Mode: Explanation.       Response: Applied knowledge.    ASSESSMENT  Long Term Goals (status prior to discharge): 1. Patient will demo increase self  awareness on weak/ affected side in order  to prevent neglect. - Goal Not Met  2 weeks from 11/19/14  Short Term Goals (status prior to discharge): 1. Pt will initiate use of call  ligh at least 50-75% during the day before getting OOB. - Goal Not Met   2. Pt will direct safe OOB transferes with Minimum Assistant - Goal Not Met   3. Family will be able to participate with ADLs, and toileting assistance at  least during the day.  7 days from 11/19/14    Progress Towards Goals (final status): SHORT TERM GOAL REVIEW:       1. Pt will initiate use of call ligh at least 50-75% during the day before  getting OOB. - Met        2. Pt will direct safe OOB transferes with Minimum Assistant - Met       3. Family will be able to participate with ADLs, and toileting assistance  at least during the day. - Met    PLAN  Recommendations for Follow-Up Care:   Patient did not receive valuables because Pt did not bring  any valuable on  admission  Bladder Program: Pt is continent with bladder  Bowel Program:  Last Bowel Movement- Pt is continent with bowel    Skin: Keep skin clean at all time  Current Diet: Cardiac diet  Pain Management: Relaxation.    Care Plan  Identified problems from team documentation:  Problem: Impaired Bladder Management    Problem: Impaired Bowel Management    Problem: Impaired Cognition  Cognition: Primary Team Goal: Patient will use strategies to compensate for lL  neglect in simple reading/writing/info management with no cues 75% of the  time./Met    Problem: Impaired Communication  Communication: Primary Team Goal: Pt will monitor for clarity, relevancy and  organization to convey mildy complex information (e.g. give directions to his  home) with occasional reminder (< 2X in 10 minute conversation)/Not Met    Problem: Impaired Leisure Skills  Leisure Skills: Primary Team Goal: Pt will demonstrate appropriate safety  awareness, mobility, and cognition in order to participate in a leisure activity  of interest at mod I level at discharge/Met    Problem: Impaired Mobility  Mobility: Primary Team Goal: Patient will ambulate household distances (50')  independently and ascend/descend 12 steps with 1 HR with modified independence  to facilitate safe return to home environment./Met    Problem: Impaired Pain Management    Problem: Impaired Psychosocial Skills/Behavior  PsychoSocial: Primary Team Goal: Pt. will improve adjustment to medical  condition and rehab process to function at the min A level/Active  PsychoSocial: Additional Team Goal:  Pt. and family will process ed re CVA,  including any psych/adjustment implications and  return to work issues as  evidenced by verbalization /    Problem: Impaired Self-care Mgmt/ADL/IADL  Self Care: Primary Team Goal: Pt will complete morning routine with supervision  provided by wife./Met    Problem: Safety Risk and Restraint  Safety: Primary Team Goal: self awareness of effected side in order to prevent  neglect/    Status update for discharge:  Branch    Please review Integrated Patient View Care Plan Flowsheet for Team identified  Problems, Interventions, and Goals.    Signed by: Parks Neptune, RN 11/27/2014 2:01:00 AM

## 2014-11-27 NOTE — Rehab Progress Note ([facility]) (Signed)
NAMEBRAIDEN [name]  MRN: [id number]  Account: [id number]  Session Start: 11/27/2014 12:00:00 AM  Session Stop: 11/27/2014 12:00:00 AM    SEVERE SEPSIS SCREEN  INFECTION:  Patient has no indication of infection.  Negative Sepsis Screen.  If you are unable to assess a system's dysfunction because you do not have labs,  or the labs you have are not current (within 24 hours), call physician and  request and order for the lab tests needed.    Signed by: Ranae Pila, RN 11/27/2014 7:58:00 AM

## 2014-11-28 ENCOUNTER — Telehealth (INDEPENDENT_AMBULATORY_CARE_PROVIDER_SITE_OTHER): Payer: Self-pay

## 2014-11-28 NOTE — Telephone Encounter (Signed)
Post Acute Discharge Call    Type of Encounter (ER, InPt or Obs) :  Inpatient/rehab     Facility: [facility]    Discharge Date: 11/27/2014    Primary Discharge Dx: CVA,essential HTN     Prior ER Visits/Hospitalizations (past yr):     TCM Eligible: yes    Follow Up Appt with PCP/Specialist :  Wife of patient states she will call the office to schedule a follow up with PCP. Pt has a follow up next with with rehab.     Outside care ordered:  [facility], PT, OT) ? No         How do you feel today after your recent hospital/ER visit?: patient states he is doing well today.     Do you have any new or worsening symptoms since being seen?  No   If so, describe your symptoms and what you have tried to relieve your symptoms:      Do you have your discharge instructions?  Any questions about them? Patient confirmed he received instructions and has no questions.     Do you have questions about any new medications or changes to your medications from the visit? Patient confirmed he is taking new medication Crestor as directed and that he understands the changes to his other medications. Patient had no questions.     Do you have assistance at home and transportation to appointments? yes      Advised to follow up as instructed.  Patient / family member verbalizes understanding and has no other questions or concerns.   Instructed to call back if symptoms change or worsen and/or go to the emergency room or call 911 for emergency symptoms.

## 2014-12-10 ENCOUNTER — Ambulatory Visit: Payer: BLUE CROSS/BLUE SHIELD | Attending: Hospice and Palliative Medicine

## 2014-12-10 DIAGNOSIS — H53452 Other localized visual field defect, left eye: Secondary | ICD-10-CM | POA: Insufficient documentation

## 2014-12-10 DIAGNOSIS — I6931 Cognitive deficits following cerebral infarction: Secondary | ICD-10-CM | POA: Insufficient documentation

## 2014-12-10 DIAGNOSIS — R279 Unspecified lack of coordination: Secondary | ICD-10-CM | POA: Insufficient documentation

## 2014-12-10 DIAGNOSIS — H539 Unspecified visual disturbance: Secondary | ICD-10-CM | POA: Insufficient documentation

## 2014-12-11 NOTE — Rehab Evaluation ([facility]) (Signed)
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      Visual disturbances                              12/11/2014  2    H53.452   Other localized visual field defect, left eye    12/11/2014  Demographics:            Age: 64Y            Gender: Male  Primary Language: English    Referring Clinician: Dr. Ellen Henri  Concurrent Services: Physical Therapy. Speech Language Pathology.  Rehab Services Received in the Past Year: Pt received IP rehab at [facility],  OT/PT/SLP, from 11/19/14 to 11/27/14.    Past Medical History: HTN  History of Present Illness:  Date of Onset: Dec. 10, 2015  Additional Information: Blake. [name] is a 64 year old, right-hand dominant  gentleman, who was  admitted to [facility] on November 15, 2014, with left  visual field cut as well as left-sided sensory loss and poor motor  coordination. MRI of the brain revealed a right-sided occipital stroke. A  few days prior to admission, he started experiencing headaches and  left-sided numbness and left visual field cut. 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 Blake. Mayford [name] participated in  all areas of therapy and made excellent gains,  the details of which are recorded in the chart. At the time of discharge,  his functional status improved to modified independent level. Blake. Mayford [name] now  presents to the [facility] for continuation of comprehensive rehab approach  to addressing remaining deficits.  Medications and Allergies:   See the medication list under the media tab in EPIC.  Rehabilitation Precautions/Restrictions:   No Driving  Visual disturbances    SUBJECTIVE  Patient/Caregiver Goals: Patient's functional goals: "I want to get  back to  being my normal self. Get back to my old routine"  "I would like to see better and to drive again"  Pain: Patient currently without complaints of pain.  Premorbid Functional Level: Patient reported Independent with all ADLs and  IADLs, + driver,  working full time  Current Functional Limitations: The patient/caregiver reports the following  functional limitations: Difficulty seeing, navigating, and "comprehending the  world"  Self-reported Quality of Life: At present time, patient reports having an  excellent quality of life/health status.  Home Environment: Patient lives with Wife: Blake [name] , who is able to  assist patient at discharge. Patient lives in a single family home. Home is  three levels. Patient is required to manage 17 step(s) within the home, with  Railing on R side with a partial wall. . Second floor bathroom setup available.  First floor full bathroom setup available. There are 5 steps to enter the home,  with right ascending handrails. There is no ramp available to enter home.  Bathroom Set Up: Tub/shower combo. Uses hand held shower head.  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 Blake [name] was employed here at [employer] for 30 years per pt  report.  Recreational Activities/Hobbies:  Reading, Driving, Working, being with family,  music, playing the guitar, widdling/sculpting, fixing things.  Equipment Owned: Paediatric nurse. Bedside commode. Rolling walker. Pt used a  walking stick occasionally PTA  Patient Report: "It is good to be here and get things going again"    OBJECTIVE  General Observation: Pt received on time with his wife present following PT  evaluation. Pt motivated to engage in OT evaluation.  Hand Dominance: Right.  Vision: Pt with noted significant impairment with L visual scanning  in  peripheral fields.  OT will continue to further assess.  Cognition: Possible cognitive impairments observed as follows: Pt with  significant difficulty with navigation/problem solving skills during OT  evaluation. Expect that pt has higher level executive functioning and  organization deficits. Pt very verbose throughout OT evaluation, difficult to  redirect. OT will continue to collaborate with SLP to further address cognitive  remediation needs.  Perceptual Skills: Possible perceptual skills impairment observed as follows:  Hooper's Visual Organization Test: 22/30- indicating significant visual  organization deficit.    Pt with frequent one eye closure (R eye) when reading large and small print. Pt  reporting "Floating letters below where trying to focus" when completing  cancellation test.    Pt only used glasses for driving PTA. Recommending reading glasses and f/u to  optometrist. Pt's wife will make an appt per her report.  Activities of Daily Living                       Current Status               Previous Status  ADLs  Date                 Jan. 5, 2015                 -  Feeding              Independent                  -  Grooming             Independent                  -  Bathing - UE         Modified independent         -  Bathing - LE         Modified independent         -  Dressing - UE        Independent                  -  Dressing - LE        Independent                  -  Toileting            Independent                  -  Homemaking           Moderate assistance          -  Statistician  Independent                  -  Tub/Shower Transfer  Modified independent         -            Bladder Management: Independent            Bowel Management: Independent    Functional Mobility:            Bed Mobility: Within functional limits.            Transfers: Patient transferred sit to/from stand requiring  supervision.      Posture: Seated posture is within normal limits. Standing posture is  within  normal limits.  Balance:   Static balance in a seated position is good.   Dynamic balance in a seated position is good.   Static balance in a standing position is good.   Dynamic balance in a standing position is good.  Instrumental Activities of Daily Living: Writing - Patient demonstrates no  impairment.   Opening Containers/Packages - Patient demonstrates no impairment.   Holding/Manipulating Coins or Small Objects - Patient demonstrates no  impairment.  Skin Integrity: Subjective report did not warrant a full skin inspection. Skin  intact where visualized.  Edema: None present.    Range of Motion  Upper Extremity:                       AROM Right    AROM Left     PROM Right    PROM Left  UPPER EXTREMITY  Gross ROM            WFL/WNL       WFL/WNL       WFL/WNL       WFL/WNL  Strength  Upper Extremities:                       Right                        Left  UPPER EXTREMITY  Gross MMT Assessment WFL/WNL                      WFL/WNL      Sensation: Grossly intact.  Fine Motor Coordination: Intact        Gross Motor Coordination: Upper and lower extremity gross motor coordination  grossly intact.    Tone/Spasticity: No relevant impairments.        Interventions:  None provided today.  Pain Reassessment: Pain was not reassessed as no pain was reported.  Education:       Learning Preference: The patient's preferred learning method is:  Explanation.  The patient's preferred learning method is: Demonstration.  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       Barriers to Learning: Cognitive limitations.  Visual deficits.       Learning Needs: Functional activities/mobility, Plan of care,  Rehabilitation techniques and procedures, Stroke,  Visual perceptual retraining,  Driving    Education Provided: Plan of care.       Audience: Patient and significant other.       Mode: Explanation.       Response: Applied knowledge.  Verbalized understanding.    ASSESSMENT  Strengths: Activities of Daily  Living, Transfers, Independent premorbid  function, Social/family support, Upper extremity function, Motivated to improve  function,  Medical Knowledge  Illness Severity  or Complexity:  Visual disturbances, cognitive processing  impairment  Equipment Needed: To be determined.  Summary of Findings: Blake. Mayford [name] is a very pleasant and motivated 64 y/o  presenting to OP OT in the [facility] s/p R CVA affecting occipital and  temporal lobes. Blake. Mayford [name] is currently impacted by significant visual  deficits, cognitive processing impairments, as well as higher level balance  deficits, which are limiting his functional performance with returning to his  premorbid life and occupational based roles. Blake. Mayford [name] will greatly benefit  from OP OT to address the above deficits and maximize his functional level of  independence. Blake. Mayford [name] is very motivated and was an active participant in  today's POC.  Necessity: Patient requires outpatient therapy in order to reduce Activities of  Daily Living or Instrumental Activities of Daily Living assistance to a  premorbid level.  Patient requires occupational therapy plan in order to return to work.  Patient requires occupational therapy plan in order to function in community.  Rehabilitation Potential:  Patient?s condition has potential to improve.  Patient is improving in response to therapy.  Maximum improvement is yet to be attained.  I expect that the anticipated improvement is attainable in a  reasonable/predictable period of time.       Motivation/Commitment to Therapy: Good.  Support Structure: Support structure is good. Family member willing to assist  patient.  Response to Evaluation: The session was tolerated well, as evidenced by: as able  to fully participate in OT evaluation without difficulty.    Activity/Participation Problem List and Goals: Self Care  Goal: LTG(20 visits from 12/11/14): Pt will demonstrate the ability to prepare a  basic hot meal at an independent  level in order to resume kitchen based tasks.    LTG(20 visits from 12/11/14): Pt will be able to navigate 5 areas in the hospital  using auditory and visual directions at a Mod I level in order to be able to  safely navigate to familiar areas of [facility] for work related needs.    STG(10 visits from 12/11/14):  Pt will locate 3 items from gift shop with auditory  and visual directions with 4 verbal cues or less in prep to improve self  navigation skills for community integration needs.    .  Functions/Structures Problem List and Goals:  Vision: LTG(20 visits from 12/11/14): Pt will complete MVPT-3 in 1 treatment  session in order to sustain ocular activity level required to read an electronic  medical chart for return to work needs.    LTG(20 visits from 12/11/14): Pt will hit 60 targets in 60 seconds on BITs  targeting all visual quadrants in order to increase visual awareness with  locating moving objects/people when walking.    LTG(20 visits from 12/11/14): Pt will improve score on Hooper's Visual  Organization Test by 5 points or greater in order to demonstrate the ability to  accurately identify and visually organize objects within his environment without  cues.    STG(10 visits from 12/11/14): Pt will sustain the ability to focus on a busy  background to locate 7 items or more for at least 20 minutes in prep to be able  to sustain focus on a computer screen to read research articles.    Marland Kitchen    PLAN  Treatment Frequency, Duration, and Interventions: Occupational Therapy is  recommended for 2x/week for at least 20 visits for 1:1 and group sessions  starting from 12/11/14 Occupational Therapy treatment is to include: Therapeutic  Exercise.  Neuromuscular Re-education.  Therapeutic Activity.  Self Care/Home Management.  Community/Work Integration.  Recommended Consults:  None currently.  Development of Plan of Care: Patient and family participated in and agreed to  plan of care development today.  The patient has been instructed to  contact the clinic if any questions or  problems should arise.    Visit Number: Today's visit is number  1    _________________________________________________  Medicare Physician Certification: This is to certify that the above named  patient, who is under my care, requires skilled Occupational Therapy services as  described in the above treatment plan.  I further certify that the services  outlined in this plan are skilled and medically necessary.  I have reviewed this  plan for rehabilitation services, and I recommend that these services continue  for 20 visits from 12/11/14 to meet the goals stated above.    Physician signature: __________________ MD,  Date of certification:  ____/____/____    Signed by: Willy Eddy, OTR/L 12/11/2014 3:00:00 PM

## 2014-12-12 NOTE — Rehab Evaluation ([facility]) (Addendum)
Corrected 01/17/2015 4:17:29 PM    NAME: Blake [name]  MRN: [id number]  Account: [id number]  Session Start: 12/11/2014 3:00:00 PM  Session Stop: 12/11/2014 4:00:00 PM    Physical Therapy  Outpatient Evaluation    Medical Diagnosis:  R PCA CVA  Therapy Diagnosis:  Rank Code      Description     Date of Onset  1    R27.8     Other lack of coordination                       12/12/2014  Demographics:            Age: 64Y            Gender: Male  Primary Language: English    Referring Clinician: Ellen Henri  Concurrent Services: Occupational Therapy.  Speech Language Pathology.  Rehab Services Received in the Past Year: PT/OT/SLP as inpt rehab 12/14-12/22    Past Medical History: HL (poorly treated), HTN,  History of Present Illness:  Date of Onset: 11/15/14  Additional Information: Blake. Macphee is a 64 year old, right-hand dominant  gentleman, who was  admitted to [facility] on November 15, 2014, with left  visual field cut as well as left-sided sensory loss and poor motor  coordination. MRI of the brain revealed a right-sided occipital stroke. A  few days prior to admission, he started experiencing headaches and  left-sided numbness and left visual field cut. 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 Blake. Mayford [name] participated in  all areas of therapy and made excellent gains,  the details of which are recorded in the chart. At the time of discharge,  his functional status improved to modified independent level. Blake. Mayford [name] now  presents to the [facility] for continuation of comprehensive rehab approach  to addressing remaining deficits.  Medications and Allergies:   See the medication list under the media tab in EPIC.  Rehabilitation Precautions/Restrictions:   No Driving  Visual orgnaization issues    SUBJECTIVE  Patient/Caregiver Goals: Patient's functional goals: "To be able to make a well  founded decision to returning to work and figuring out how to make that  decision.   To understand why or why not I should return to work."  Pain: Patient currently without complaints of pain.  Occasional headaches which  he believes are attributable to sinuses..  Premorbid Functional Level: Patient reported Pt was working full time as a  Insurance account manager.  Pt was I PTA.  Pt would walk regularly.  Pt would do yard work and  did not report any difficulty.  Current Functional Limitations: The patient/caregiver reports the following  functional limitations: pt reports cognitive deficits, difficulty with visual  perceptual, difficulty managing curbs, negotiation of obstacles, navigation of  outdoor surfaces.  Wife reports she is not letting the pt walkalone outdoors, pt  is not helping as much with cooking and cleaning.  Pt is not driving currently.  Self-reported Quality of Life: At present time, patient reports having a very  good quality of life/health status.  Home Environment: Patient lives with Wife: [name] , who is able to  assist patient at discharge. Patient lives in a single family home. Home is  three levels. Patient is required to manage 17 step(s) within the home, with  right ascending handrails. Second floor bathroom setup available. No stairs to  enter the home. There is no ramp available to enter home.  -  there are 5 steps  to enter home with R ascending rail.  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  Blake [name] was employed here at [employer] for 30 years per pt report.  Recreational Activities/Hobbies:  Active, works, independent,  Soil scientist,  wood working and Adult nurse: None.  Patient Report: "I think physically I am doing quite well there was no weakness  as a result of the stroke and I mostly just have this visual stuff and some  cognitive things going on.'    OBJECTIVE  General Observation:  pt is verbose and hard to redirect at times, tends to over  explain things.  Presents with wife.  Appears uncomfortable on mat constantly  changing positions.  Posture: Seated posture with the following deviations: Thoracic kyphosis  increased. Standing posture with the following deviations: Thoracic kyphosis  increased.  Range of Motion: No relevant impairments.  Strength: No relevant impairments.  Tone/Spasticity:  No relevant impairments.  Sensation: Grossly intact. Great toe proprioception intact to testing.  Functional Mobility:            Bed Mobility: All bed mobility including supine to sit, rolling,  scooting. with independence.            Transfers: Patient transferred bed to/from chair with independence.            Locomotion/Wheelchair: Not assessed.            Locomotion/Gait: Patient was modified independent with gait/ambulation  for pt is I for community level ambulation- he has difficulty with navigation  due to visual processing issues . No assistive devices were required.            Stairs: Patient was independent for . No equipment was used.  Special Tests:  FGA:  24/30    MCTSIB:  Excessive sway in conditions 3 and 4 but no LOB.    SOT: TBA at later date- pt has motion sickness but agrees to testing at a later  date  Palpation: n/a    Skin Integrity: Subjective report did not warrant a full skin inspection. Skin  intact where visualized.  Balance:   Dynamic balance in a standing position is fair.   Static balance in a standing position is good.  Gross Motor Coordination:   Bilateral upper extremity gross motor coordination is without significant  impairments related to function.   Bilateral lower extremity gross motor coordination is without significant  impairments related to function.  Other/Additional Findings: Pt was unable to locate stair well in blue lobby post  climbing stairs he was unable to relocate the gym in which he was just prior to  climbing the stairs.  Saccades:  occasional  retinal slip with saccades to the L  Smooth pursuits:  Appear intact to testing  VOR x 1 viewing: impaired, pt has difficulty completing for 30 seconds  Pt fully tracking into all quadrants with conjugate gaze  + diploplia noted (pt describes this as a shadow not two seperate images)-  improves with occlusion of one eye    Interventions: None provided today.  Pain Reassessment: Pain was not reassessed as no pain was reported.  Education:       Learning Preference: The patient's preferred learning method is:  Explanation.  The patient's preferred learning method is: Demonstration.  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       Barriers to Learning: Cognitive limitations.  Visual deficits.       Learning Needs: Functional activities/mobility, Plan of care, Precautions,  Rehabilitation techniques and procedures, Safety, Stroke    Education Provided: Plan of care.       Audience: Patient and significant other.       Mode: Explanation.  Demonstration.       Response: Verbalized understanding.    ASSESSMENT  Strengths:  job, educational level, understanding of stroke and stroke recovery,  family support, prior level of function, current functional status  Illness Severity or Complexity: Mental/cognitive disorders  Equipment Needed: To be determined.  Summary of Findings: Blake. Mayford [name] presents s/p PCA CVA with noteable visual  deficits, cognitive deficits, and higher level balance impairments.  Currently,  he is not safe to ambulate himself in the community due to frequently getting  loss and being unable to find his way even in familiar settings.  This couples  with his poor balance place him at risk for falls and injury.  Recommend outpt  PT to assist with the above and allow him to resume his life roles as a father,  grandfather, and husband.  Necessity: Patient requires physical therapy plan in order to return to  Premorbid environment (or reside in new living environment).  Patient requires  physical therapy plan in order to return to work.  Patient requires physical therapy plan in order to function in community.  Patient requires outpatient, skilled physical therapy in order to maximize  independence and safety while reducing burden of care during functional mobility  in the home.  Rehabilitation Potential:  Patient?s condition has potential to improve.  Patient is improving in response to therapy.  Maximum improvement is yet to be attained.  I expect that the anticipated improvement is attainable in a  reasonable/predictable period of time.  Motivation/Commitment to Therapy: Good.  Support Structure: Support structure is good. Family member willing to assist  patient.  Response to Evaluation:  The session was tolerated fairly, as evidenced by: pt  with low back pain and shouler pain intermittent throughout session    Activity/Participation Problem List and Goals: Mobility: Walking and Moving  Around  Goal:  LTG( 20 visits from 1/5):  Pt will score WNL on the SOT to appropriate  use sensory systems for balance    STG( 10 visits from 1/5):  Pt will score WNL on the FGA to reduce fall risk    LTG ( 20 visits from 1/5): Pt will access community setting including navigating  over uneven ground, curbs, ramps at an I level    STG ( 10 visits from 1/5):  Pt will complete a navigation task within the  hospital with minimal cueing only    LTG ( 20 visits from 1/5):  Pt will complete a naviation task within the  familiar setting of the hospital with S from therapist  Functions/Structures Problem List and Goals:  Vestibular: STG ( 10 visits from 1/5):  Pt will complete MCTSIB without  excessive postural sway    LTG (20 visits from 1/5):  Pt will perform VOR x 1 viewing for 1 minute without  signs of symptoms of dizziness or evidence of retinal slip    PLAN  Treatment Frequency, Duration, and Interventions: Physical Therapy is  recommended for 2 times a week for 20 visits from 12/11/14 Physical Therapy  treatment  is to include: TENS Unit.  Hot Cold Pack.  Electrical Stimulation.  Attended E-Stim.  Therapeutic Exercise.  Investment banker, operational.  Massage.  Manual Therapy.  Therapeutic Activity.  Recommended Consults: None currently.  Development of Plan of Care: Patient and family participated in and agreed to  plan of care development today.  The patient has been instructed to contact our clinic if any questions or  problems should arise.    Visit Number: Today's visit is number  1  _____________________________________________________________    Medicare Physician Certification: This is to certify that the above named  patient, who is under my care, requires skilled Physical Therapy  services as  described in the above treatment plan.  I further certify that the services  outlined in this plan are skilled and medically necessary.  I have reviewed this  plan for rehabilitation services, and I recommend that these services continue  for 10 visits from 12/11/14 to meet the goals stated above.    Physician signature: __________________ MD,  Date of certification:  ____/____/____    Signed by: Darrold Span, PT 12/11/2014 4:00:00 PM

## 2014-12-12 NOTE — Rehab Evaluation ([facility]) (Signed)
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 Ellen Henri  Concurrent Services: Physical Therapy.  Occupational Therapy.  Rehab Services Received in the Past Year: Pt received IP rehab at [facility],  OT/PT/SLP, from 11/19/14 to 11/27/14.    Past Medical History: HTN  History of Present Illness:  Date of Onset: 11/15/14  Additional Information: Blake. [name] is a 64 year old, right-hand dominant  gentleman, who was  admitted to [facility] on November 15, 2014, with left  visual field cut as well as left-sided sensory loss and poor motor  coordination. MRI of the brain revealed a right thalamic/occipital area CVA. A  few days prior to admission, he started experiencing headaches and  left-sided numbness and left visual field cut. 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 Blake. Mayford [name] participated in  all areas of therapy and made excellent gains,  the details of which are recorded in his medical chart. At the time of  discharge,  his functional status improved to modified independent level. Blake. Mayford [name] now  presents to the [facility] for continuation of comprehensive rehab approach  to addressing remaining deficits.    Medications and Allergies:   See the medication list under the media tab in EPIC.  Rehabilitation Precautions/Restrictions:   No driving. Visual Disturbances    SUBJECTIVE  Patient/Caregiver Goals: Patient's functional goals: To return to work in some  capacity  Pain: Patient currently without  complaints of pain.  Premorbid Functional Level: Patient reported indep with all ADLs, working full  time (neurologist), driving  Current Functional Limitations: The patient/caregiver reports the following  functional limitations: Pt reports difficulty with the "organization of my  personal space" and "maintaining a visual memory of where I put things." Pt  seemingly referring to L inattention and visual disturbances.  Self-reported Quality of Life: At present time, patient reports having a very  good quality of life/health status.  Home Environment: Patient lives in a home environment. Patient lives with Wife:  Blake [name] who is (are) able to assist patient at discharge.  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 Blake [name] was employed here at [employer] for 30 years per pt  [name].  Recreational Activities/Hobbies:  Reading, Driving, Working, being with family,  music, playing the guitar, widdling/sculpting, fixing things.  [name]:  "How would I even assess my ability to return to work?"    OBJECTIVE  General Observations: Pt arrived on time with wife who was present throughout  evaluation. Pt filling out his own paperwork upon greeting.  Language/Cognitive Tests Administered:     Hopkins Verbal Learning Test  Total Recall  Raw Score: 30  Total Recall T Score: 55  Delayed Recall Raw Score: 11  Delayed Recall T Score: 56  Retention Raw Score: 92  Retention T Score: 50  Recognition Discrimination Index: 12  Recognition Discrimination Index T Score: 60     Attention Process Training Test  Sustained Attention Score: Did not test  Sustained Attention Normative Mean: Did not test  Complex Sustained Attention Score: 26  Complex Sustained Attention Normative Mean: 19.5  Selective Attention Score: 28  Selective Attention  Normative Mean: 22.3  Divided Attention Score: 28  Divided Normative Mean: 27.6  Percent Correct on Simultaneous Cancellation Task: 86% %  Alternating Attention Score: 22  Alternating Attention Normative Mean: 16.8    * For APT-T, norms were utilized for the age group 40-59 despite pt's age of 64.  This age group was determined to be more consistent with pt's premorbid  education level and current working status (full time, neurologist). Pt scored  above average on all levels of attn. Of note, pt with mild difficulty inhibiting  responses, leading to occasional false positives. During cancellation portion of  divided attn subtest, pt likely mildly impacted by visual-perceptual deficits  and L inattention.    Orientation:        Within functional limits.    Communication:  Mild-moderate pragmatic language deficits marked by decreased organization,  cohesion and relevancy of content in unstructured tasks. Difficulty inhibiting  length of explanations and tenancy towards over-analysis.    Cognition:  Mild  R Hemisphere Dysfunction with neglect and visual field disturbances  impacting spatial orientation and management of all graphic/printed information  resulting in an overall mild impact upon simple functions. Pt is compensating  very well at this time but still benefiting from cued use of strategies and  increased structure. Results from formal testing may not accurately capture the  degree of deficit due to pt's high premorbid level of function and ability to  compensate. However, pt did demonstrate break down during more complex,  integrated tasks where he had to use working memory, visual/auditory attn, and  problem solving to complete a deductive reasoning task. Pt required  significantly extra time and several repetitions of information. Difficulty  analyzing information and making a conclusion based off of information.        Interventions: No treatment provided today.  Pain Reassessment: Pain was not  reassessed as no pain was reported.  Education:       Learning Preference: The patient's preferred learning method is:  Explanation.  The patient's preferred learning method is: Demonstration.  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       Barriers to Learning: Visual deficits.       Learning Needs: Communication/cognition, Plan of care    Education Provided: Plan of care.       Audience: Patient and significant other.       Mode: Explanation.       Response: Verbalized understanding.    ASSESSMENT  Summary of Evaluation: Pt presents with mild right hemispheric dysfunction with  a L neglect and field cut complicating the clinical picture. Visual-perceptual  deficits seem to be improving and pt is compensating better in functional  situations, still benefiting from cued use of strategies and increased  structure. Cognitively, pt presents with mild deficits during higher level,  integrated tasks. He benefits from significantly extra time, structure, and  repetition of information for problem solving and reasoning. Visual-perceptual  deficits likely impacting performance. From a communication  standpoint, pt does  best with constraints and structure. Pragmatic deficits in organization,  coherence and clarity of message are pronounced in spontaneous conversation. Pt  has a tendency toward over elaboration which confounds even the simplist  message.    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 Therefore,  speech therapy is recommended at this time for cognitive-linguistic re-training,  learning compensatory strategies and pt/family education in order to meet goals  stated below.  Strengths: Independent premorbid function, Social/family support, Motivated to  improve function, Time since onset  Illness Severity or Complexity:  Equipment Needed: None.  Necessity: Patient requires speech language therapy  plan in order to return to  work.  Patient requires speech language therapy plan in order to function in community.    Rehabilitation Potential:  Patient?s condition has potential to improve.  Patient is improving in response to therapy.  Maximum improvement is yet to be attained.       Motivation/Commitment to Therapy: Good.  Support Structure: Support structure is good. Family member willing to assist  patient.  Response to Evaluation: The session was tolerated well, as evidenced by: good  participation and effort    Activity/Participation Problem List and Goals: Attention  Goal: LTG (30 visits from 12/11/14) Pt will utilize edging strategies to  compensate for neglect in complex reading, writing and information management  (e.g. filling out pt [name]) with no cues 90% of the time    STG (10 visits from 12/11/14) Pt will utilize edging strategies to compensate for  neglect in moderately complex reading, writing and information management (e.g.  filling out pt [name]) with no cues 90% of the time  .     Spoken Language Expression  Goal: LTG (30 visits from 12/11/14) Pt will monitor for clarity, relevancy and  organization to convey complex information (e.g. mock interview with pt) with  minimal clarification from unfamiliar listener.    STG (10 visits from 12/11/14) Pt will monitor for clarity, relevancy and  organization to convey moderately complex information (e.g. mock interview with  pt) with occasional reminders (<2 across 10 min)  .  Functions/Structures Problem List and Goals:  Problem Solving: LTG (30 visits from 12/11/14) Pt will utilize learned strategies  to compensate for cognitive deficits for effective management of complex home,  personal, community and work responsibilities at supervision to functional  independence asst level    STG (10 visits from 12/11/14) Pt will utilize learned strategies to compensate for  cognitive deficits for effective management of moderately complex tasks given  extra time  only.  Marland Kitchen    PLAN  Treatment Frequency, Duration, and Interventions: Speech/Language Pathology  services recommended for 2x/week for 30 visits; groups as indicated  Speech/Language Pathology treatment is to include: Treat speech, language,  voice, communication, and/or auditory processing disorder. Group treat speech,  language, voice, communication, and/or auditory processing disorder.  Recommended Consults:  None currently.  Development of Plan of Care: Patient and family participated in and agreed to  plan of care development today.  The patient has been instructed to contact the clinic if any questions or  problems should arise.    Visit Number: Today's visit is number  1  _____________________________________________________    Medicare Physician Certification: This is to certify that the above named  patient, who is under my care, requires skilled Speech/Language Pathology  services as described in the above treatment plan.  I further certify that the  services outlined in this plan are skilled and medically necessary.  I have  reviewed this plan for rehabilitation services, and I recommend that these  services continue for 10 visits  to meet the goals stated above.    Physician signature: __________________ MD,  Date of certification:  ____/____/____    Signed by: Ranee Gosselin, M.S., CCC-SLP 12/11/2014 12:00:00 PM

## 2014-12-13 NOTE — Rehab Progress Note ([facility]) (Signed)
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 Ellen Henri    Demographics:            Age: 36Y            Gender: Male    Rehabilitation Precautions/Restrictions:   No Driving  Visual orgnaization impairment    SUBJECTIVE  Patient Report: "Now I see the nature of the game! This is how I manifest  myself!"  Patient/Caregiver Goals: "I want to get back to being my normal self. Get back  to my old routine"  "I would like to see better and to drive again"  Pain: Patient currently without complaints of pain.    OBJECTIVE  General Observation: Pt received on time, motivated to engage in session,  patients wife present    Interventions:       Therapeutic Activities:  Visual perceptual training completed in today's  session. Pt engaged in BITs activities, please see below for further  information. With rotator mode pt with difficulty identifying correct letters as  they slowy rotated around the screen. Pt with postural changes to accomodate  with visual challenges.    Red/Green Glasses: Pt with ocular misalingment with light sitting nasal/inward  in bilateral eyes. Diplopia noted outside of converegence norms with + changes  in color as he was unable to fuse.    OT provided pt with HEP targeting ocular ROM/focus/fixation. Pt independently  demonstrated competnecy with exercises.    BITS:  User Paced Mode:  Trial 1: 49/50, 81 sec, 98%, 1.65 RT  Trial 2: 46/50, 70 sec, 92%, 1.40 RT  Trial 3: 48/50, 61 sec, 96%, 1.23 RT    Rotator:  Letters: 19/20,68 sec, 95%, 3.27 RT  Numbers: 19/20, 47  sec, 95%, 2.34 RT  Letters: 20/20, 52 sec, 100%, 1.75 RT      Pain Reassessment: Pain was not reassessed as no pain was reported.  Education:    Education Provided: Plan of care.       Audience: Patient and significant other.       Mode: Explanation.       Response: Applied knowledge.  Verbalized understanding.    ASSESSMENT  Pt's visual perceptual skills continue to impact his performance and ability to  engage in reading, computer activities, and navigation skills. OT will continue  to focus on visual perceptual retrianing in order to optimize his functional  performance with resuming meaningful occupational based tasks.    Activity/Participation Problem List and Goals: No updates at this time.  Functions/Structures Problem List and Goals: No updates at this time.    PLAN  Treatment Frequency, Duration, and Interventions: Continue Occupational Therapy  to achieve goals per previously established Plan of Care.  Development of Plan of Care: There was no change to plan of care today.  Patient  and/or family continue to be in agreement with plan of care.  The patient has been instructed to contact our clinic if any questions or  problems should arise.    Visit Number: Today's visit is number  2    Signed by: Willy Eddy, OTR/L 12/13/2014 11:00:00 AM

## 2014-12-14 NOTE — Rehab Progress Note ([facility]) (Signed)
Blake [name]  MRN: [id number]  Account: [id number]  Session Start: 12/13/2014 1:00:00 PM  Session Stop: 12/13/2014 2:00:00 PM    Physical Therapy  Outpatient Treatment Note    Medical Diagnosis:  R PCA CVA  Therapy Diagnosis:  Rank Code      Description     Date of Onset  1    R27.8     Other lack of coordination                       12/12/2014  Demographics:            Age: 66Y            Gender: Male    Rehabilitation Precautions/Restrictions:   No Driving  Visual orgnaization issues    SUBJECTIVE  Patient Report: "My hip is bothering me a lot more today so it may impact what I  am able to do."  Patient/Caregiver Goals: "To be able to make a well founded decision to  returning to work and figuring out how to make that decision.  To understand why  or why not I should return to work."  Pain: Patient currently has pain.  Patient reports a pain level of 4 out of 10. No pain intervention was  facilitated because patient refused.    OBJECTIVE  General Observation: pt is verbose and hard to redirect at times.  Presents with  wife.  Tends to fidget a lot and attempts to alter body or task in a way to make  things easier (ie: widening feet or turning feet out during a balance task-  excessive use of knee flexion to maintain balance)  Other/Additional Findings: n/a    Interventions:       Neuromuscular Reeducation:  The following objective tests were performed  from IE.   Neuro Com SOT: pt scored 4% below age matches norms with difficulty  on condition 4 with all 4 trials below norms, as well as difficulty on condition  5 and 6 with evidence of learning throughout task.  Decreased use of vision.  Performed standing on foam statically rhombergs, progressing to multidirectional  head turns.  Tandem stance on foam with static holds, rocker board with R and L  ball toss- pt had several episodes of missing the ball on the L hand due to  field cut.  Side stepping on foam beam, tandem walking on foam beam.  VOR x 1  viewing in  seated position.  Pain Reassessment: Pain was not reassessed as no pain was reported.  Education:    Education Provided: Plan of care. Precautions.       Audience: Patient.       Mode: Explanation.       Response: Verbalized understanding.    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  Unable to perform VOR x 1 viewing in seated position due to  reports of visual fatigue and pain.  Will attempt this at start of next session.   Noteable field cut causing loss of ball when throwing into the L hemisphere.    Activity/Participation Problem List and Goals: No updates at this time.  Functions/Structures Problem List and Goals: No updates at this time.    PLAN  Treatment Frequency, Duration, and Interventions: Continue Physical Therapy to  achieve goals per previously established Plan of Care.  Development of Plan of Care: There was no change to plan of  care today. Patient  and/or family continue to be in agreement with plan of care.  The patient has been instructed to contact our clinic if any questions or  problems should arise.    Visit Number: Today's visit is number  2    Signed by: Darrold Span, PT 12/13/2014 2:00:00 PM

## 2014-12-14 NOTE — Rehab Evaluation ([facility]) (Signed)
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 Ellen Henri  Referring Service/Team:  PM and R  Rehabilitation Precautions/Restrictions:   No driving. Visual Disturbances    SUBJECTIVE  Patient Report: "I notice a fatigue, visually and cognitively."  Patient/Caregiver Goals: To return to work in some capacity  Pain: Patient currently without complaints of pain.    OBJECTIVE  General Observation: Pt arrived on time with wife who was present throughout  session. Agreeable to student observer. Pt reporting significant visual fatigue  after PT and OT session.    Interventions:       Speech Treatment: Focused on verbally presented tasks vs written due to  pt's visual fatigue.    Working Memory/Verbal Organization/Deductive Reasoning- Pt given 2 mock pt  studies (1 neurological and 1 related to internal medicine). Pt given verbally  presented h/o present illness for each case. Pt required to ask questions to  identify missing information, formulate problem list and then formulate a plan  of care. Pt with very good acc keeping track of verbal info with use of  notetaking. Good ability to identify missing info (>90%), good ability to  formulate a problem list and develop a plan (approx 90% acc on 1st case and 60%  acc on second case i'ly; increased to 100% given min assist to address all  points). Excellent prgmatics and verbal organization during familiar, structured  task.  Pain Reassessment: Pain was not reassessed as no pain was reported.  Education:    Education Provided: cognitive strategies.  HEP .       Audience: Patient and significant other.       Mode: Explanation.       Response: Verbalized understanding.  Needs reinforcement.    ASSESSMENT  Pt with high acc today with familiar but complex, structured task. Fatigue may  have impacted performance somewhat but pt compensating very well.    Activity/Participation Problem List and Goals: No updates at this time.  Functions/Structures Problem List and Goals: No updates at this time.    PLAN  Treatment Frequency, Duration, and Interventions: Continue Speech/Language  Pathology to achieve goals per previously established Plan of Care.  Development of Plan of Care: There was no change to plan of care today. Patient  and/or family continue to be in agreement with plan of care.  The patient has been instructed to contact our clinic if any questions or  problems should arise.    Visit Number: Today's visit is number  2    Signed by: Ranee Gosselin, M.S., CCC-SLP 12/13/2014 3:00:00 PM

## 2014-12-17 ENCOUNTER — Encounter (INDEPENDENT_AMBULATORY_CARE_PROVIDER_SITE_OTHER): Payer: Self-pay | Admitting: Internal Medicine

## 2014-12-17 ENCOUNTER — Ambulatory Visit (INDEPENDENT_AMBULATORY_CARE_PROVIDER_SITE_OTHER): Payer: BLUE CROSS/BLUE SHIELD | Admitting: Internal Medicine

## 2014-12-17 VITALS — BP 130/80 | HR 83 | Temp 97.9°F | Resp 20 | Ht 73.5 in | Wt 235.0 lb

## 2014-12-17 DIAGNOSIS — I10 Essential (primary) hypertension: Secondary | ICD-10-CM

## 2014-12-17 DIAGNOSIS — I6529 Occlusion and stenosis of unspecified carotid artery: Secondary | ICD-10-CM | POA: Insufficient documentation

## 2014-12-17 DIAGNOSIS — Z1211 Encounter for screening for malignant neoplasm of colon: Secondary | ICD-10-CM

## 2014-12-17 DIAGNOSIS — E782 Mixed hyperlipidemia: Secondary | ICD-10-CM

## 2014-12-17 MED ORDER — LOSARTAN POTASSIUM 50 MG PO TABS
50.0000 mg | ORAL_TABLET | Freq: Every day | ORAL | Status: DC
Start: 2014-12-17 — End: 2015-10-04

## 2014-12-17 MED ORDER — AMLODIPINE BESYLATE 5 MG PO TABS
5.0000 mg | ORAL_TABLET | Freq: Every day | ORAL | Status: AC
Start: 2014-12-17 — End: ?

## 2014-12-17 MED ORDER — ROSUVASTATIN CALCIUM 20 MG PO TABS
20.0000 mg | ORAL_TABLET | Freq: Every evening | ORAL | Status: DC
Start: 2014-12-17 — End: 2015-09-16

## 2014-12-17 NOTE — Progress Notes (Signed)
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    Hypertension  This is a chronic problem. The current episode started more than 1 year ago. The problem has been gradually improving since onset. The problem is controlled. Pertinent negatives include no anxiety, blurred vision, chest pain, malaise/fatigue, neck pain or orthopnea. Risk factors for coronary artery disease include family history, dyslipidemia and obesity. The current treatment provides moderate improvement. Hypertensive end-organ damage includes CVA. There is no history of angina, kidney disease or CAD/MI.   Hyperlipidemia  Pertinent negatives include no chest pain.       The following portions of the patient's history were reviewed and updated as appropriate: allergies, current medications, past family history, past medical history, past social history, past surgical history and problem list.    Review of Systems   Constitutional: Negative for malaise/fatigue.   Eyes: Negative for blurred vision.   Cardiovascular: Negative for chest pain and orthopnea.   Musculoskeletal: Negative for neck pain.           Objective:    Physical Exam  General appearance - alert, well appearing, and in no distress  Mental status - alert, oriented to person, place, and time  Chest - clear to auscultation, no wheezes, rales or rhonchi, symmetric air entry  Heart - normal rate, regular rhythm, normal S1, S2, no murmurs,   ANeurological - alert, oriented, normal speech,   Musculoskeletal - no joint tenderness, deformity or swelling  Extremities - peripheral pulses normal,   Skin - normal coloration and turgor, no rashes, no suspicious skin lesions noted    Patient Active Problem List   Diagnosis   . Loss of peripheral visual field, left   . Acute CVA (cerebrovascular accident)   . Essential hypertension   . Stroke     Current Outpatient Prescriptions   Medication Sig Dispense Refill   . amLODIPine (NORVASC) 5 MG tablet Take 1 tablet (5 mg total) by  mouth daily. 90 tablet 0   . aspirin 81 MG chewable tablet Chew 4 tablets (324 mg total) by mouth daily. 120 tablet 0   . losartan (COZAAR) 50 MG tablet Take 1 tablet (50 mg total) by mouth daily. 90 tablet 0   . rosuvastatin (CRESTOR) 20 MG tablet Take 1 tablet (20 mg total) by mouth nightly. 90 tablet 0     No current facility-administered medications for this visit.     No Known Allergies          Assessment:             Plan:      Procedures        htn low salt diet bp log target lessthan 140/90    hyperlipid    Avoid saturated fat on statin repeat blood work order    Stroke/carotid stenosis asprin/statin bp controll patient would see neuro vascular at [facility]    Also cardio referal for thallium     Preventive colonoscopy      Advice shingle and pneumonia shot

## 2014-12-18 NOTE — Rehab Progress Note ([facility]) (Signed)
Blake [name]  MRN: [id number]  Account: [id number]  Session Start: 12/18/2014 3:00:00 PM  Session Stop: 12/18/2014 4:00:00 PM    Physical Therapy  Outpatient Treatment Note    Medical Diagnosis:  R PCA CVA  Therapy Diagnosis:  Rank Code      Description     Date of Onset  1    R27.8     Other lack of coordination                       12/12/2014  Demographics:            Age: 82Y            Gender: Male    Rehabilitation Precautions/Restrictions:   No Driving  Visual orgnaization issues    SUBJECTIVE  Patient Report: "My hip limits what I am able to do."  Patient/Caregiver Goals: "To be able to make a well founded decision to  returning to work and figuring out how to make that decision.  To understand why  or why not I should return to work."  Pain: Patient currently has pain.  Patient reports a pain level of - unable to articulate. pt does not rate pain  but pt has pain over L greater trochanter to touch and tenderness to palpation  throughout IT band    OBJECTIVE  General Observation: pt is verbose and hard to redirect at times.  Presents with  wife.  Tends to fidget a lot, appears internally and externally distracted  Other/Additional Findings: n/a    Interventions:       Therapeutic Activities:  Invision performed with pt- perception time is 20  milliseconds, DVA is .13 on L and .24 onthe R with a 7% asymmetry, gaze  stabilization is R 119 degrees per second and L 143 degrees per second.  Performed various components of the FGA including R and L and up and down head  turns, tandem gait.  Pt limited by L hip pain, performed foam rolling to IT  band, pt reports relief post.  Pain Reassessment: Pain was not reassessed as no pain was reported.  Education:    Education Provided: Plan of care.       Audience: Patient.       Mode: Explanation.  Demonstration.       Response: Verbalized understanding.    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  He is noted to have  mild balance impairments and deviation from straight path  especially noteable with R and L head turns.  He remains internally and  externally distracted and tends to over analyze task at hand.  Recommend ongoing  dynamic balance activities as well as assisting with scanning activities into  field cut    Activity/Participation Problem List and Goals: No updates at this time.  Functions/Structures Problem List and Goals: No updates at this time.    PLAN  Treatment Frequency, Duration, and Interventions: Continue Physical Therapy to  achieve goals per previously established Plan of Care.  Development of Plan of Care: There was no change to plan of care today. Patient  and/or family continue to be in agreement with plan of care.  The patient has been instructed to contact our clinic if any questions or  problems should arise.    Visit Number: Today's visit is number  3    Signed by: Darrold Span, PT 12/18/2014 4:00:00 PM

## 2014-12-18 NOTE — Rehab Progress Note ([facility]) (Addendum)
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 Ellen Henri    Demographics:            Age: 63Y            Gender: Male    Rehabilitation Precautions/Restrictions:   No Driving  Visual orgnaization issues    SUBJECTIVE  Patient Report: "I attempted something this weekend, I tried writing a check,  that did not go well"  Patient/Caregiver Goals: "I want to get back to being my normal self. Get back  to my old routine"  "I would like to see better and to drive again"  Pain: Patient currently without complaints of pain.    OBJECTIVE  General Observation: Pt received on time, motivated to engage in treatment. OTAS  present under direct supervision from his MOTR/L.    Interventions:       Therapeutic Activities:  Visual perceptual training completed in today's  session. Pt engaged in BITs activities, please see below for further  information. With rotator mode pt with difficulty identifying correct letters as  they slowy rotated around the screen. Pt with postural changes to accomodate  with visual challenges.    The remainder of session, pt engaged in prereading tasks using 57M font from  [employer] targeting scanning and saccades. Pt with minimal errors with  scanning task, requiring verbal and visual cues to correct. Pt with periods of  skipping lines, throughout, making multiple errors. Pt completed reading task as  the end of session with difficulty staying on line and reading L to R. OT  applied tape to paper to encourage optimal head eye  dissocation throughout.  Homework issued targeting similar activities from today's session.    BITS:  User Paced Mode  trial 1: 50/52, 1.17RT, 61 sec  Trial 2: 50/52, 1.24 RT, 64 sec  Trial 3: 51/52, 1.15 RT, 59 sec    Rotator:  Trial 1: 25/26, 2.04 RT, 53 sec--100% size in stimuli  Trial 2: 24/26, 2.29 RT, 58 sec- 50% size in stimuli- increased rotator speed to  3  Pain Reassessment: Pain was not reassessed as no pain was reported.  Education:    Education Provided: Plan of care.       Audience: Patient and significant other.       Mode: Explanation.       Response: Applied knowledge.  Verbalized understanding.    ASSESSMENT  Pt's visual perceptual processing skills continue to impact his ability to read  and process information, limiting his performance with resuming occupational  based tasks and work related activities. 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    Activity/Participation Problem List and Goals: No updates at this time.  Functions/Structures Problem List and Goals: No updates at this time.    PLAN  Treatment Frequency, Duration, and Interventions: Continue Occupational Therapy  to achieve goals per previously established Plan of Care.  Development of Plan of Care: There was no change to plan of care today. Patient  and/or family continue to be in agreement with plan of care.  The patient has been instructed to contact our clinic if any questions or  problems should arise.    Visit Number: Today's visit is number  3    Signed by: Willy Eddy, OTR/L 12/18/2014 3:00:00 PM

## 2014-12-19 NOTE — Rehab Evaluation ([facility]) (Signed)
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 Ellen Henri  Referring Service/Team:  PM and R  Rehabilitation Precautions/Restrictions:   No driving. Visual Disturbances    SUBJECTIVE  Patient Report: "I'm frustrated that I should have done better than that."  Patient/Caregiver Goals: To return to work in some capacity  Pain: Patient currently without complaints of pain.    OBJECTIVE  General Observation: Pt arrived on time with wife who was present throughout  session. Agreeable to student observer.    Interventions:       Speech Treatment: Diagnostic treatment today. Administered Memory for  Intentions Test. See results below. Pt below average overall and in a few  specific subtests (2-minute time delay, time cue, and verbal response). Pt often  recalling when to do a test but forgetting the task itself.      Memory for Intentions Test:  RRT Raw Score: 8  RRT Percentile: >99  PMT Raw Score: 33  PMT Percentile: 44  2- Minute Delay Raw Score: 6  2- Minute Delay Percentile: 33  15- Minute Delay Raw Score: 5  15- Minute Delay Percentile: 56  Time Cue Raw Score: 5  Time Cue Percentile: 48  Event Cue Raw Score: 6  Event Cue Percentile: 63  Verbal Response Raw Score: 5  Verbal Response Percentile: 30  Action Response Raw Score: 6  Action Response Percentile: 70    Pain Reassessment: Pain was not reassessed as no pain was reported.  Education:    Education Provided: cognitive strategies. HEP .       Audience: Patient and significant other.       Mode: Explanation.       Response: Verbalized  understanding.  Needs reinforcement.    ASSESSMENT  Pt reporting noticeable decrease in his prospective memory skills today given  added complexity (increasing amount of info, dividing attn).    Activity/Participation Problem List and Goals: No updates at this time.  Functions/Structures Problem List and Goals: No updates at this time.    PLAN  Treatment Frequency, Duration, and Interventions: Continue Speech/Language  Pathology to achieve goals per previously established Plan of Care.  Development of Plan of Care: There was no change to plan of care today. Patient  and/or family continue to be in agreement with plan of care.  The patient has been instructed to contact our clinic if any questions or  problems should arise.    Visit Number: Today's visit is number  3    Signed by: Ranee Gosselin, M.S., CCC-SLP 12/18/2014 2:00:00 PM

## 2014-12-21 NOTE — Rehab Progress Note ([facility]) (Signed)
NAMEGLENDAL [name]  MRN: [id number]  Account: [id number]  Session Start: 12/21/2014 1:00:00 PM  Session Stop: 12/21/2014 2:00:00 PM    Physical Therapy  Outpatient Treatment Note    Medical Diagnosis:  R PCA CVA  Therapy Diagnosis:  Rank Code      Description     Date of Onset  1    R27.8     Other lack of coordination                       12/12/2014  Demographics:            Age: 74Y            Gender: Male    Rehabilitation Precautions/Restrictions:   No Driving  Visual orgnaization issues    SUBJECTIVE  Patient Report: "my hip has been feeling better.  I also got out my tai chi  stuff but I didn't do it."  Patient/Caregiver Goals: "To be able to make a well founded decision to  returning to work and figuring out how to make that decision.  To understand why  or why not I should return to work."  Pain: Patient currently has pain.  Patient reports a pain level of - unable to articulate. reports mild IT band  pain localized to greater trochanter    OBJECTIVE  General Observation: pt is verbose and hard to redirect at times.  Internally  and externally distracted  Other/Additional Findings: n/a    Interventions:       Therapeutic Activities:  Performed clamshells x 20 reps, resisted hip abd  in hooklying x 20 reps, bridging with TA activation x 15 reps, standing hip abd  x 15 reps B, foam roller to L IT band.  Standing single leg stance maintaining  balance for 2-3 seconds on R then L LE on a cone.  Performed various components  of tai chi series which pt lead with PT providing S.  Pain Reassessment: Pain was not reassessed as no pain was reported.  Education:    Education Provided: Plan of care. Home exercise/activity plan.       Audience: Patient.       Mode: Explanation.  Demonstration.       Response: Verbalized understanding.    ASSESSMENT  Pt verbalized less pain today over L IT back and greater trochanter after last  session at end of session he denied pain.  He was able to demonstrate several  tai chi  exercises to PT today with appropriate coordination and balance.  Encouraged pt to begin tai chi again at home.  Recommend ongoing oupt PT to  assist with optimizing balance and coordination as well as assist pt with  effective navigation strategies.  Today he was able to successfully locate the  water fountain without assistance    Activity/Participation Problem List and Goals: No updates at this time.  Functions/Structures Problem List and Goals: No updates at this time.    PLAN  Treatment Frequency, Duration, and Interventions: Continue Physical Therapy to  achieve goals per previously established Plan of Care.  Development of Plan of Care: There was no change to plan of care today. Patient  and/or family continue to be in agreement with plan of care.  The patient has been instructed to contact our clinic if any questions or  problems should arise.    Visit Number: Today's visit is number  4    Signed by: Darrold Span, PT 12/21/2014 2:00:00 PM

## 2014-12-21 NOTE — Rehab Progress Note ([facility]) (Signed)
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 Get back  to my old routine"  "I would like to see better and to drive again"  Pain: Patient currently without complaints of pain.    OBJECTIVE  General Observation: Pt received on time motivated to engage in session with  spouse present. OTAS present under direct supervision from MOTR/L.    Interventions:       Therapeutic Activities:  Visual perceptual training completed in today's  session. Homework reviewed from last session, pt with 2 errors and able to self  correct with 1 verbal cue. Pt transitioned to figure ground task sustaining  ocular staminia for 20 min. Pt required 50% assist to find 5 objects from a busy  background- pt with occasional rest breaks secondary to ocular fatigue. At the  end of session, pt began homework for over the weekend, circling words "and" and  "the" on first page of 4 page article related to the [place] [employer]. Pt abld  to review work and self correct throughout, but missed 3 words in 3 paragraphs  after therapist review. Pt issued remaining task for homework.  Pain Reassessment: Pain was not reassessed as no pain was  reported.  Education:    Education Provided: Plan of care.       Audience: Patient and significant other.       Mode: Explanation.       Response: Applied knowledge.  Verbalized understanding.    ASSESSMENT  Pt continues to benefit from focus on visual perceptual retraining as his  processing skills continue to limit his ability to return to leisure and  occupational based reading needs. Pt continues to be motivated in treatment  sessions and continues to benefit from ongoing OT 2x/week.    Activity/Participation Problem List and Goals: No updates at this time.  Functions/Structures Problem List and Goals: No updates at this time.    PLAN  Treatment Frequency, Duration, and Interventions: Continue Occupational Therapy  to achieve goals per previously established Plan of Care.  Development of Plan of Care: There was no change to plan of care today. Patient  and/or family continue to be in agreement with plan of care.  The patient has been instructed to contact our clinic if any questions or  problems  should arise.    Visit Number: Today's visit is number  4    Signed by: Willy Eddy, OTR/L 12/21/2014 3:00:00 PM

## 2014-12-24 NOTE — Rehab Evaluation ([facility]) (Signed)
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 Ellen Henri  Referring Service/Team:  Medicine  Rehabilitation Precautions/Restrictions:   No driving. Visual Disturbances    SUBJECTIVE  Patient Report: "There might be some sort of impulsive or careless thing  happening, and that's where things fall appart." - Pt. referring to difficulties  staying organized with tasks at home.  Patient/Caregiver Goals: To return to work in some capacity  Pain: Patient currently without complaints of pain.    OBJECTIVE  General Observation: Pt. arrived on time to session. Supervising therapist  present in room, guiding and directing treatment and plan of care.    Interventions:       Speech Treatment: Attention: Pt. and clinician discussed attention and  organization strategies (e.g., environmental placement, avoiding distractions,  doing one thing at a time, self-talk)  for around the house RE: patient's report  of difficulty with visual attention especially for personal items. Pt. receptive  to education.    Problem solving and spoken language: Pt. completed moderately complex problem  solving task (involving verbally and visually presented information) largely  independently, requiring one verbal cue to make final comparrison to select 1  answer. During task completion, Pt. used spoken language to explain comparrison  through process with minimal verbal cueing for clarity.    HEP: No homework assigned this session.        Pain Reassessment: Pain was not reassessed as no pain was  reported.  Education:    Education Provided: Attention / visual attention strategies .       Audience: Patient.       Mode: Explanation.       Response: Verbalized understanding.  Needs practice.  Needs reinforcement.    ASSESSMENT  Pt. reporting difficutly with visual attention since last session. Pt. receptive  to education on topic.    Activity/Participation Problem List and Goals: No updates at this time.  Functions/Structures Problem List and Goals: No updates at this time.    PLAN  Treatment Frequency, Duration, and Interventions: Continue Speech/Language  Pathology to achieve goals per previously established Plan of Care.  Development of Plan of Care: There was no change to plan of care today. Patient  and/or family continue to be in agreement with plan of care.  The patient has been instructed to contact our clinic if any questions or  problems should arise.    Visit Number: Today's visit is number  4    Signed by: Daron Offer, SLP Student 12/21/2014 4:00:00 PM     - CoSigned By: Ranee Gosselin, M.S., CCC-SLP 12/24/2014 8:14:52 AM

## 2014-12-26 ENCOUNTER — Telehealth (INDEPENDENT_AMBULATORY_CARE_PROVIDER_SITE_OTHER): Payer: Self-pay | Admitting: Cardiology

## 2014-12-26 DIAGNOSIS — I639 Cerebral infarction, unspecified: Secondary | ICD-10-CM

## 2014-12-26 DIAGNOSIS — R9431 Abnormal electrocardiogram [ECG] [EKG]: Secondary | ICD-10-CM

## 2014-12-26 NOTE — Rehab Evaluation ([facility]) (Signed)
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 Ellen Henri  Referring Service/Team:  Medicine  Rehabilitation Precautions/Restrictions:   No driving. Visual Disturbances    SUBJECTIVE  Patient Report: "There is no standardized test." pt referring analyzing his  ability to return to work in some capacity  Patient/Caregiver Goals: To return to work in some capacity  Pain: Patient currently without complaints of pain.    OBJECTIVE  General Observation: Pt. arrived on time to session. Agreeable to student  observer/participation.    Interventions:       Speech Treatment: Verbal Problem Solving- Pt verbalizing concerns about how  to assess ability to return to work. Pt identifying several important factors to  consider i'ly, taking into account deficits appropriately. Good insight. Good  verbal organizaiton/coherence.    Complex attn/problem solving (online shopping activity given several written  requirements and a budget). Pt began activity. Good verbalization of  instrustions and plan for execution. Pt i'ly initating use of underlining to  identiy important info. Cues to utilize straight edge to reduce difficulty when  reading. Pt interrupted during task with complex phonecall. Pt i'ly initiated  notetaking appropriately to recall important info and able to return to task  seemlessly.    Prospective Memory- Pt given task to complete with 5 min delay. Pt recalled and  completed task within 8 minutes.    HEP: Pt to complete online shopping activity at home.        Pain Reassessment: Pain was  not reassessed as no pain was reported.  Education:    Education Provided: Attention / visual attention strategies .       Audience: Patient.       Mode: Explanation.       Response: Verbalized understanding.  Needs practice.  Needs reinforcement.    ASSESSMENT  Pt with excellent verbal problem solving today. Continued improvement with attn  problem solving, verbal organization, memory and insight.    Activity/Participation Problem List and Goals: No updates at this time.  Functions/Structures Problem List and Goals: No updates at this time.    PLAN  Treatment Frequency, Duration, and Interventions: Continue Speech/Language  Pathology to achieve goals per previously established Plan of Care.  Development of Plan of Care: There was no change to plan of care today. Patient  and/or family continue to be in agreement with plan of care.  The patient has been instructed to contact our clinic if any questions or  problems should arise.    Visit Number: Today's visit is number  5    Signed by: Ranee Gosselin, M.S., CCC-SLP 12/26/2014 4:00:00 PM

## 2014-12-26 NOTE — Telephone Encounter (Signed)
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 He is doing much better and his visual acuity is better. Dr Francesco Sor recommended a neuro vascular evaluation. That MD recommends a cardiac nuclear scan which we had discussed earlier and a 1 month event monitor. I will see the patient back after the event  Monitor. Also the patient will be increasing the crestor to 40 mg daily and plans to have follow up lipids soon.

## 2014-12-26 NOTE — Rehab Progress Note ([facility]) (Signed)
Blake [name]  MRN: [id number]  Account: [id number]  Session Start: 12/26/2014 1:00:00 PM  Session Stop: 12/26/2014 2:00:00 PM    Physical Therapy  Outpatient Treatment Note    Medical Diagnosis:  R PCA CVA  Therapy Diagnosis:  Rank Code      Description     Date of Onset  1    R27.8     Other lack of coordination                       12/12/2014  Demographics:            Age: 69Y            Gender: Male    Rehabilitation Precautions/Restrictions:   No Driving  Visual orgnaization issues    SUBJECTIVE  Patient Report: "sorry I over slept my wife and I feel asleep."  Patient/Caregiver Goals: "To be able to make a well founded decision to  returning to work and figuring out how to make that decision.  To understand why  or why not I should return to work."  Pain: Patient currently without complaints of pain.    OBJECTIVE  General Observation: pt is verbose and hard to redirect at times.  Internally  and externally distracted.  Arrived 20 mins late to PT appt.  Other/Additional Findings: n/a    Interventions:       Neuromuscular Reeducation:  Upright bike for 10 mins at L 4.  Standing on  foam single leg stance with multiple cone tap consecutively R and L to fatigue.  Performed tandem stance on foam static holds x 30 seconds progressing to VOR  cancellation on foam to the R and L with mild LOB primarily to the L - with  ongoing reps pt maintains throughout.  Pain Reassessment: Pain was not reassessed as no pain was reported.  Education:    Education Provided: Plan of care.       Audience: Patient.       Mode: Explanation.  Demonstration.       Response: Verbalized understanding.    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  Improvements also noted with  incorporation of head turns today.  Pt still nearly collided several times while  negotiating gym due to field cut and visual processing impairments.    Activity/Participation  Problem List and Goals: No updates at this time.  Functions/Structures Problem List and Goals: No updates at this time.    PLAN  Treatment Frequency, Duration, and Interventions: Continue Physical Therapy to  achieve goals per previously established Plan of Care.  Development of Plan of Care: There was no change to plan of care today. Patient  and/or family continue to be in agreement with plan of care.  The patient has been instructed to contact our clinic if any questions or  problems should arise.    Visit Number: Today's visit is number  5    Signed by: Darrold Span, PT 12/26/2014 2:00:00 PM

## 2014-12-27 NOTE — Rehab Progress Note ([facility]) (Signed)
NAMEWYNNE JURY  MRN: [id number]  Account: [id number]  Session Start: 12/26/2014 12:00:00 AM  Session Stop: 12/26/2014 12:00:00 AM    Occupational Therapy  Outpatient Missed Visit Note    The patient did not attend the therapy appointment on 12/26/14 at 1400.    Reason:  Therapist out with sick child  Future Appointment: The patient has additional appointments.    Signed by: Willy Eddy, OTR/L 12/26/2014 3:00:00 PM

## 2014-12-28 NOTE — Rehab Progress Note ([facility]) (Addendum)
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 Ellen Henri    Demographics:            Age: 40Y            Gender: Male    Rehabilitation Precautions/Restrictions:   No Driving  Visual orgnaization issues    SUBJECTIVE  Patient Report: "My vision is really frustrating me, it is so hard to explain  this"  Patient/Caregiver Goals: "I want to get back to being my normal self. Get back  to my old routine"  "I would like to see better and to drive again"  Pain: Patient currently without complaints of pain.    OBJECTIVE  General Observation: Pt received on time, motivated to engage in treatment.    Interventions:       Therapeutic Activities:  Pt completed BITs activities targeting L visual  attention. please see below. The majority of session focus on education on  visual perceptual skills, patient's concerns, and integration to visual  stratgies to assist with low vision needs. Pt provided with information on where  to purchase a book light to assist with visual clarity when reading. OT issued  homework targeting prereading skills, using a book light, having the patient go  let to right, line by line. Line guide issued to assit with inhibiting "letters  jumping" around the page.    BITS:  User Paced mode:  Trial 1: 51/52, 1.11 RT, 98%, 67 seconds  Trail 2: 49/52, 1.06 RT, 94%, 55 seconds  Trial 3: 49/52, 1.14 RT, 94%, 58 seconds  Pain Reassessment: Pain was not reassessed as no pain was reported.  Education:    Education Provided: Plan of care.        Audience: Patient.       Mode: Explanation.       Response: Applied knowledge.  Verbalized understanding.    ASSESSMENT  Pt's visual perceptual deficits continue to greatly impact his functional  ability to resume reading and computer based activities. Pt will benefit from  ongoing OP OT 2x/week to further address these defict areas.    Activity/Participation Problem List and Goals: No updates at this time.  Functions/Structures Problem List and Goals: No updates at this time.    PLAN  Treatment Frequency, Duration, and Interventions: Continue Occupational Therapy  to achieve goals per previously established Plan of Care.  Development of Plan of Care: There was no change to plan of care today. Patient  and/or family continue to be in agreement with plan of care.  The patient has been instructed to contact our clinic if any questions or  problems should arise.    Visit Number: Today's visit is number  5    Signed by: Willy Eddy, OTR/L 12/28/2014  1:00:00 PM

## 2014-12-30 NOTE — Rehab Evaluation ([facility]) (Signed)
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 Ellen Henri  Referring Service/Team:  Medicine  Rehabilitation Precautions/Restrictions:   No driving. Visual Disturbances    SUBJECTIVE  Patient Report: "I think I feel bad because I didn't complete my homework, and  now I'm being silly."  Patient/Caregiver Goals: To return to work in some capacity  Pain: Patient currently without complaints of pain.    OBJECTIVE  General Observation: Pt. arrived on time to session after PT. Pt pacing in  hallway and seemed nervous/agitated. Agreeable to student  observer/participation.    Interventions:       Speech Treatment: HEP: Pt did not complete seemingly due to pt being busy  but also suspect pt forgetting some instructions for completion and/or  experiencing difficulty when attempting to complete.    Complex attn/problem solving (online shopping activity given several written  requirements and a budget). Pt continued task. Good verbal planning/problem  solving but significant difficulty paying attn to details, organizing info,  recalling/integrating info and problem solving certain decisions within task  (i.e. figuring out to buy a cooler when info given referred to mom frequently  carrying a lot of soda to sports games). Pt being impulsive with decision  making, reading and problem solving, resulting in several errors. When pt was  stopped and made aware, he admitted feeling "bad" that he didn't finish the  hmwk. Pt seemed maybe embarrassed or surprised/agitated about level of  difficulty he was  having. Likely high visual component frustrating for pt as  well. Pt benefited from discusison and encouragement to take his time.    HEP: Pt to complete online shopping activity at home.        Pain Reassessment: Pain was not reassessed as no pain was reported.  Education:    Education Provided: Attention / visual attention strategies .       Audience: Patient.       Mode: Explanation.       Response: Verbalized understanding.  Needs practice.  Needs reinforcement.    ASSESSMENT  Pt with new behaviors today (embarassment/agitation/frustration) leading to  impulsivity and decreased awareness/self-monitoring during complex task. Pt  benefited from discussion and encouragement to slow down and take one step at a  time.    Activity/Participation Problem List and Goals: No updates at this time.  Functions/Structures Problem List and Goals: No updates at this time.    PLAN  Treatment Frequency, Duration, and Interventions: Continue Speech/Language  Pathology to achieve goals per previously established Plan of Care.  Development of Plan of Care: There was no change to plan of care today. Patient  and/or family continue to be in agreement with plan of care.  The patient has been instructed to contact our clinic if any questions or  problems should arise.    Visit Number: Today's visit is number  6    Signed by: Ranee Gosselin, M.S., CCC-SLP  12/28/2014 12:00:00 PM

## 2014-12-31 NOTE — Rehab Progress Note ([facility]) (Signed)
NAMEEMELIO [name]  MRN: [id number]  Account: [id number]  Session Start: 12/28/2014 10:30:00 AM  Session Stop: 12/28/2014 11:00:00 AM    Physical Therapy  Outpatient Treatment Note    Medical Diagnosis:  R PCA CVA  Therapy Diagnosis:  Rank Code      Description     Date of Onset  1    R27.8     Other lack of coordination                       12/12/2014  Demographics:            Age: 19Y            Gender: Male    Rehabilitation Precautions/Restrictions:   No Driving  Visual orgnaization issues    SUBJECTIVE  Patient Report: Pt is motivated to participate in therapy.  Patient/Caregiver Goals: "To be able to make a well founded decision to  returning to work and figuring out how to make that decision.  To understand why  or why not I should return to work."  Pain: Patient currently without complaints of pain.    OBJECTIVE  General Observation: Arrived 30 mins late to PT appt.  Other/Additional Findings:    Interventions:       Neuromuscular Reeducation:  Upright bike for 5 mins at L 4.  Standing on  foam single leg stance with multiple cone tap consecutively R and L to fatigue  with S to min assist.  Performed tandem stance on foam static holds x 30 seconds  progressing to VOR cancellation on foam to the R and L with mild LOB. Standing  on rocker board with eyes closed with min assist.  Pain Reassessment: Pain was not reassessed as no pain was reported.  Education:    Education Provided: safety and technique with exercise .       Audience: Patient.       Mode: Explanation.  Demonstration.       Response: Verbalized understanding.    ASSESSMENT  Pt demo decreased balance on compliant surfaces and will benefit from continued  skilled PT to increase safety and decrease fall risk.    Activity/Participation Problem List and Goals: No updates at this time.  Functions/Structures Problem List and Goals: No updates at this time.    PLAN  Treatment Frequency, Duration, and Interventions: Continue Physical Therapy to  achieve  goals per previously established Plan of Care.  Development of Plan of Care: There was no change to plan of care today. Patient  and/or family continue to be in agreement with plan of care.  The patient has been instructed to contact our clinic if any questions or  problems should arise.    Visit Number: Today's visit is number  6    Signed by: Blima Dessert, PT, DPT 12/28/2014 11:00:00 AM

## 2015-01-01 NOTE — Rehab Progress Note ([facility]) (Signed)
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 Ellen Henri    Demographics:            Age: 21Y            Gender: Male    Rehabilitation Precautions/Restrictions:   No Driving  Visual orgnaization issues    SUBJECTIVE  Patient Report: "I will go see my eye doctor"  Patient/Caregiver Goals: "I want to get back to being my normal self. Get back  to my old routine"  "I would like to see better and to drive again"  Pain: Patient currently without complaints of pain.    OBJECTIVE  General Observation: Pt received on time for session, motivated to engage in  session. OTAS present under direct supervision from MOTR/L.    Interventions:       Therapeutic Activities:  Today's session focused on cosulation with low  vision specialist RE: patient's photosensitivty and contrast changes. Pt  provided with information to follow up with low vision optometrist, regular  optometris for acuity check/visual field test, and provided to probably  recommendations for adaptations to assist with reading needs. OT completed  LIGHTHOUSE AND SNELLEN testing pt recording:    LIGHTHOUSE: 40M-biocular  SNELLEN: 20/15- biocular  Pain Reassessment: Pain was not reassessed as no pain was reported.  Education:    Education Provided: Rehab techniques and procedures. Low Vision       Audience: Patient.       Mode: Explanation.  Demonstration.       Response: Applied knowledge.  Verbalized understanding.    ASSESSMENT  Pt's visual contrast changes continues to impact his ability to read and use the  computer. OT  continues to stress the importance of follow up with optometrist to  further address health, acuity changes, and visual field changes . Pt reporting  will follow up next week. MVPT-3 to be completed at pt's next scheduled appt to  further assess visual perceptual changes.    Activity/Participation Problem List and Goals: No updates at this time.  Functions/Structures Problem List and Goals: No updates at this time.    PLAN  Treatment Frequency, Duration, and Interventions: Continue Occupational Therapy  to achieve goals per previously established Plan of Care.  Development of Plan of Care: There was no change to plan of care today. Patient  and/or family continue to be in agreement with plan of care.  The patient has been instructed to contact our clinic if any questions or  problems should arise.    Visit Number: Today's visit is number  6    Signed by: Willy Eddy, OTR/L 01/01/2015 2:00:00 PM

## 2015-01-01 NOTE — Rehab Progress Note ([facility]) (Signed)
[name]  MRN: [id number]  Account: [id number]  Session Start: 01/01/2015 2:00:00 PM  Session Stop: 01/01/2015 2:55:00 PM    Physical Therapy  Outpatient Treatment Note    Medical Diagnosis:  R PCA CVA  Therapy Diagnosis:  Rank Code      Description     Date of Onset  1    R27.8     Other lack of coordination                       12/12/2014  Demographics:            Age: 37Y            Gender: Male    Rehabilitation Precautions/Restrictions:   No Driving  Visual orgnaization issues    SUBJECTIVE  Patient Report: "Nothing new."  Patient/Caregiver Goals: "To be able to make a well founded decision to  returning to work and figuring out how to make that decision.  To understand why  or why not I should return to work."  Pain: Patient currently without complaints of pain.    OBJECTIVE  General Observation: Arrived 30 mins late to PT appt.  Other/Additional Findings:    Interventions:       Neuromuscular Reeducation:  Gait training over treadmill at 2.4 miles per  hour for 6 mins focusing on increased step length and heel strike.  Standing on  rocker board with eyes closed with S to min assist up to 7 seconds. Standing on  rocker board while performing horizontal head turns and VOR with S to min  assist. Rocker board was positioned in ant/post and med/lat direction. Pt was  also instructed in performing forward lunges with trunk rotations with 2# ball  and side squats with 2# ball with S.  Pain Reassessment: Pain was not reassessed as no pain was reported.  Education:    Education Provided: safety and technique with exercise .       Audience: Patient.       Mode: Explanation.  Demonstration.       Response: Verbalized understanding.    ASSESSMENT  Pt demo decreased balance when standing on rocker board especially with eyes  closed requiring min assist to recover balance. Pt also presents with decreased  activity tolerance and requires rest breaks.    Activity/Participation Problem List and Goals: No updates at  this time.  Functions/Structures Problem List and Goals: No updates at this time.    PLAN  Treatment Frequency, Duration, and Interventions: Continue Physical Therapy to  achieve goals per previously established Plan of Care.  Development of Plan of Care: There was no change to plan of care today. Patient  and/or family continue to be in agreement with plan of care.  The patient has been instructed to contact our clinic if any questions or  problems should arise.    Visit Number: Today's visit is number  7    Signed by: Blima Dessert, PT, DPT 01/01/2015 2:55:00 PM

## 2015-01-03 ENCOUNTER — Ambulatory Visit (INDEPENDENT_AMBULATORY_CARE_PROVIDER_SITE_OTHER): Payer: BLUE CROSS/BLUE SHIELD | Admitting: Cardiovascular Disease

## 2015-01-03 VITALS — BP 162/96 | HR 60 | Ht 74.0 in | Wt 235.0 lb

## 2015-01-03 DIAGNOSIS — I639 Cerebral infarction, unspecified: Secondary | ICD-10-CM

## 2015-01-03 DIAGNOSIS — I6529 Occlusion and stenosis of unspecified carotid artery: Secondary | ICD-10-CM

## 2015-01-03 MED ORDER — TECHNETIUM TC 99M TETROFOSMIN INJECTION
1.0000 | Freq: Once | Status: AC
Start: 2015-01-03 — End: 2015-01-03
  Administered 2015-01-03: 1 via INTRAVENOUS

## 2015-01-03 MED ORDER — REGADENOSON 0.4 MG/5ML IV SOLN
0.4000 mg | Freq: Once | INTRAVENOUS | Status: AC
Start: 2015-01-03 — End: 2015-01-03
  Administered 2015-01-03: 0.4 mg via INTRAVENOUS

## 2015-01-03 NOTE — Procedures (Signed)
[facility] NUCLEAR ECG REPORT    [facility] - [facility]  Tel [phone]      PATIENT:  NUH LIPTON, MD         MRN: [id number].  Gender: male.  DOB: [birth year].  Age: 64 y.o.    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  Normal QRS.  No ischemic ST changes.      Protocol:  Lexiscan  Stress time:  1 minutes 46 seconds.   Vitals at rest:  60 bpm, 162/96 mmHg  Vitals at peak stress:  88 bpm, 142/92 mmHg    Reason for stopping exercise:  Protocol completed   ST changes at peak stress:  No ischemic changes noted  Arrhythmias:  none  Recovery:  Uneventful    LEXISCAN ECG IMPRESSION:  1. Patient tolerated the pharmacological stress   2. Nuclear Imaging report will be reported separately       Lexiscan ECG interpreted and electronically signed by:  Dr. Betti Cruz, MD

## 2015-01-03 NOTE — Rehab Evaluation ([facility]) (Signed)
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 Ellen Henri  Referring Service/Team:  Medicine  Rehabilitation Precautions/Restrictions:   No driving. Visual Disturbances    SUBJECTIVE  Patient Report: "So I guess there are many ambiguities in this assignment, but I  suppose there are also ambiguities in real life." - Pt. referring to ambiguities  in HEP assignment (complex functional problem solving), during which he was  perseverating on ambiguities with very concrete reasoning, but also showed some  insight by relating fictional problem to real life.  Patient/Caregiver Goals: To return to work in some capacity  Pain: Patient currently without complaints of pain.    OBJECTIVE  General Observation: Pt. arrived on time to session after OT. Session completed  with additional lamp provided by OT to assist with low vision deficits during  reading/writing activities. Supervising therapist present in the room, guiding  and directing treatment and plan of care.    Interventions:       Speech Treatment: HEP: Pt. arrived with complex deductive reasoning task  not fully completed, but with more written work than in previous session. Pt.  required min assist  to clearly explain reasoning and problem solving process  used at home during session. Also notable, Pt. again mentioned he thought  clinicians would want to see him navigat the internet instead of doing it at  home, and seemed unaware this was already addressed last week.    Problem-solving: Pt. completed moderately complex planning/problem  solving  written tas. Pt. required min-mod assist to take appropriate/organized notes  from verbally presented information, and to develop plan for organizing schedule  based on information provided. Pt. again focused on verbal ambiguities in  information presented and required min-mod assist to make sense of and utilize  ambiguous information.    HEP: No homework assigned this session.  Pain Reassessment: Pain was not reassessed as no pain was reported.  Education:    Education Provided: Problem solving strategies .       Audience: Patient.       Mode: Explanation.  Demonstration.       Response: Verbalized understanding.  Demonstrated skill.  Needs practice.  Needs reinforcement.    ASSESSMENT  Pt. with some difficulties working with abstract information in functional  complex and moderately-complex tasks today. Pt. with less frustration and  impulsivity than last session, but still some decreased self-monitoring and  mental flexibility during tasks. Pt frequently over-analyzing and perseverating  on info and very concrete in his reasoning.    Activity/Participation Problem List and Goals: No updates at this time.  Functions/Structures Problem List and Goals: No updates at this time.    PLAN  Treatment Frequency, Duration, and Interventions: Continue Speech/Language  Pathology to achieve goals per previously established Plan of Care.  Development of Plan of Care: There was no change to plan of care today. Patient  and/or family continue to be  in agreement with plan of care.  The patient has been instructed to contact our clinic if any questions or  problems should arise.    Visit Number: Today's visit is number  7    Signed by: Daron Offer, SLP Student 01/01/2015 4:00:00 PM     - CoSigned By: Ranee Gosselin, M.S., CCC-SLP 01/03/2015 8:43:44 AM

## 2015-01-03 NOTE — Procedures (Signed)
[facility]  Lexiscan Myoview Perfusion Study    NAME:  [name], MD    DOB:  December 18, 1950    STUDY DATE: 01/03/2015  MRN:  [id number]   SEX: male      REFERRING PHYSICIAN:        INDICATION:    1. Acute CVA (cerebrovascular accident)    2. Stenosis of carotid artery, unspecified laterality        LEXISCAN PERFUSION STUDY  The patient was injected with 10.45 mCi of Myoview (IV Location (Rest): Rt/AC) and gated SPECT imaging was begun 30 minutes post injection. The camera obtained images with a 180-degree orbit around the patient's heart. After the rest images, the patient was then prepped for a Lexiscan pharmacologic stress test. The patient was given 5 ml (0.4 mg) of IV Lexiscan (Regadenoson) over a ten second rapid infusion with a five to ten ml normal saline flush given immediately after the infusion. The patient was then given an intravenous injection of 35.5 mCi of Myoview (IV Location (Stress): Rt/AC)  followed by another five to ten ml normal saline flush. The patient was monitored for another eight to ten minutes or until the patient's EKG, BP and symptoms came back to baseline.  Gated SPECT imaging was started at 60 minutes post injection. The patient was placed in the supine position and the images were obtained with a 180-degree orbit around the patient's heart while gating to the patient's EKG. The study was then processed and displayed for evaluation.    PHARMACOLOGIC STRESS  Resting ECG:  Normal sinus rhythm.  Normal QRS.  No ischemic ST changes.      Protocol:  Lexiscan  Stress time:  1 minutes 46 seconds.   Vitals at rest:  60 bpm, 162/96 mmHg  Vitals at peak stress:  88 bpm, 142/92 mmHg    Reason for stopping exercise:  Protocol completed   ST changes at peak stress:  No ischemic changes noted  Arrhythmias:  none  Recovery:  Uneventful    LEXISCAN ECG IMPRESSION:  1. Patient tolerated the pharmacological stress   2. Nuclear Imaging report will be  reported separately       .    INTERPRETATION  Cine review of the raw projection images reveals significant motion artifact of the corrected images . Pharmacologic stress and rest tomographic images demonstrate a normal perfusion pattern with no significant defects or areas of reversibility seen.  [employer] quantitative analysis confirms this visual interpretation. The left ventricle is normal in size.  Gated wall motion study demonstrates normal LV thickening and wall motion with a calculated LVEF of 56% at stress.    CONCLUSIONS  1. Normal myocardial perfusion scan  2.  No evidence of inducible ischemia or scar.  3. Normal left ventricular function with calculated LVEF 56 % on stress images   4. Normal wall motion    INTERPRETED BY: Bari Edward, MD

## 2015-01-04 ENCOUNTER — Telehealth (INDEPENDENT_AMBULATORY_CARE_PROVIDER_SITE_OTHER): Payer: Self-pay

## 2015-01-04 NOTE — Rehab Progress Note ([facility]) (Signed)
Blake [name]  MRN: [id number]  Account: [id number]  Session Start: 01/04/2015 2:00:00 PM  Session Stop: 01/04/2015 3:00:00 PM    Physical Therapy  Outpatient Treatment Note    Medical Diagnosis:  R PCA CVA  Therapy Diagnosis:  Rank Code      Description     Date of Onset  1    R27.8     Other lack of coordination                       12/12/2014  Demographics:            Age: 51Y            Gender: Male    Rehabilitation Precautions/Restrictions:   No Driving  Visual orgnaization issues    SUBJECTIVE  Patient Report: "I feel like I have better control with the rocker board and the  foam versus nothing and just shifting on my feet, it's awkward."  Patient/Caregiver Goals: "To be able to make a well founded decision to  returning to work and figuring out how to make that decision.  To understand why  or why not I should return to work."  Pain: Patient currently without complaints of pain.    OBJECTIVE  General Observation: pt arrived on time to PT appt without wife present  Other/Additional Findings: n/a    Interventions:       Neuromuscular Reeducation:  Gait training over treadmill at 2.4 miles per  hour for 10 mins focusing on increased step length and heel strike.  Neuro com  training with weighshifts at L 3 into the l direction with pinwheel then foam  then rockerboard.  Performed with responsive surroundings for pinwheel only, pt  needed cueing to attend to L side due to field cut and manual cueing for  weightshifting.  Standing balance activities including side stepping L and R on  foam beam and tandem gait on foam beam x 3 reps.  Pain Reassessment: Pain was not reassessed as no pain was reported.  Education:    Education Provided: Plan of care. Rehab techniques and procedures.       Audience: Patient.       Mode: Explanation.       Response: Applied knowledge.  Verbalized understanding.    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   Additionally, he had significanrt difficulty  coordinating weightshifting and continually moved his feet dispite cueing.  Recommend ongoing dynamic balance training as well as use of neuro com to  optimize sensory systems.    Activity/Participation Problem List and Goals: No updates at this time.  Functions/Structures Problem List and Goals: No updates at this time.    PLAN  Treatment Frequency, Duration, and Interventions: Continue Physical Therapy to  achieve goals per previously established Plan of Care.  Development of Plan of Care: There was no change to plan of care today. Patient  and/or family continue to be in agreement with plan of care.  The patient has been instructed to contact our clinic if any questions or  problems should arise.    Visit Number: Today's visit is number  8    Signed by: Darrold Span, PT 01/04/2015 3:00:00 PM

## 2015-01-04 NOTE — Telephone Encounter (Signed)
L/M regarding results on home VM

## 2015-01-04 NOTE — Telephone Encounter (Signed)
-----   Message from Bari Edward, MD sent at 01/03/2015  3:27 PM EST -----  Normal stress test, f/u as  directed

## 2015-01-04 NOTE — Rehab Progress Note ([facility]) (Addendum)
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 Ellen Henri    Demographics:            Age: 77Y            Gender: Male    Rehabilitation Precautions/Restrictions:   No Driving  Visual orgnaization issues    SUBJECTIVE  Patient Report: "I went to [employer]! The lighting was off. I also tried to  find my way to the cardiologist and got lost in the building!"  Patient/Caregiver Goals: "I want to get back to being my normal self. Get back  to my old routine"  "I would like to see better and to drive again"  Pain: Patient currently without complaints of pain.    OBJECTIVE  General Observation: Pt received on time motivated to engage in session. OTAS  present under direct supervision from MOTR/L.    Interventions:       Therapeutic Activities:  Pt completed BITS activities targeting L visual  fields and visual organization needs. Please see below. The reaminder of session  targeted visual scanning L to R in a high stimulated environment in prep for  reading skills doe return to work needs. Pt with notable errors and given  homework to continue to address similiar skills from today's session.    BITS  User Paced Mode  Trial 1: 50/52 targets, 1.14 RT, 67 seconds  Trial 2: 56/60 targets, 1.03 RT, 61 seconds  Trial 3: 56/60 targets, 1.06 RT, 61 seconds    Rotator (letters)  Trial 1: 25/26 targets, 1.59 RT, 40 sec, 4 speed  Trial 2: 24/26 targets, 1.84 RT, 46 sec, 8 speed  Trial 3:  25/26 targets, 2.10 RT, 52 sec, 8 speed  Pain Reassessment: Pain  was not reassessed as no pain was reported.  Education:    Education Provided: Plan of care.       Audience: Patient.       Mode: Explanation.       Response: Applied knowledge.  Verbalized understanding.    ASSESSMENT  Pt's visual perceptual impairments continue to impact his functional performance  with ocular endurance needed to return to prolonged reading tasks. Pt will  continue to benefit from focus on visual retraining/adaptive recommendations in  OP OT treatment sessions. Continue with ongoing OP OT 2x/week.    Activity/Participation Problem List and Goals: No updates at this time.  Functions/Structures Problem List and Goals: No updates at this time.    PLAN  Treatment Frequency, Duration, and Interventions: Continue Occupational Therapy  to achieve goals per previously established Plan of Care.  Development of Plan of Care: There was no change to plan of care today. Patient  and/or family continue to be in agreement with plan of care.  The patient has been instructed to contact our clinic if any questions or  problems should arise.    Visit Number: Today's visit is number  7    Signed by: Willy Eddy, OTR/L 01/04/2015 12:00:00 PM

## 2015-01-07 ENCOUNTER — Ambulatory Visit: Payer: BLUE CROSS/BLUE SHIELD

## 2015-01-07 ENCOUNTER — Encounter (INDEPENDENT_AMBULATORY_CARE_PROVIDER_SITE_OTHER): Payer: Self-pay

## 2015-01-07 ENCOUNTER — Ambulatory Visit: Payer: BLUE CROSS/BLUE SHIELD | Attending: Hospice and Palliative Medicine

## 2015-01-07 DIAGNOSIS — H53452 Other localized visual field defect, left eye: Secondary | ICD-10-CM | POA: Insufficient documentation

## 2015-01-07 DIAGNOSIS — H539 Unspecified visual disturbance: Secondary | ICD-10-CM | POA: Insufficient documentation

## 2015-01-07 DIAGNOSIS — R279 Unspecified lack of coordination: Secondary | ICD-10-CM | POA: Insufficient documentation

## 2015-01-07 DIAGNOSIS — I6931 Cognitive deficits following cerebral infarction: Secondary | ICD-10-CM | POA: Insufficient documentation

## 2015-01-07 NOTE — Rehab Evaluation ([facility]) (Signed)
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 Ellen Henri  Referring Service/Team:  Medicine  Rehabilitation Precautions/Restrictions:   No driving. Visual Disturbances    SUBJECTIVE  Patient Report: "I guess it depends if they are organizing by genetics or  something else." pt referring to ambiguity again, revealing decreased mental  flexibility and ability to follow instructions given specific criteria.  Patient/Caregiver Goals: To return to work in some capacity  Pain: Patient currently without complaints of pain.    OBJECTIVE  General Observation: Pt. arrived on time to session. Session completed with  additional lamp provided by OT to assist with low vision deficits during  reading/writing activities. Supervising therapist present in the room, guiding  and directing treatment and plan of care.    Interventions:       Speech Treatment: Mod complex alternating attn/problem solving  (classification of dog breeds given written info and branching diagram). Pt  requiring min assist to plan and organize info, to generate appropriate  strategies, and for self-monitoring/error awareness. Pt with noted L neglect  today during this task, requiring verbal cues to use edging strategy and  reminders to use strategies consistently. Pt with fairly good problem  solving/categorizing so far given min assist but unable to complete due to time.  Will finish next session.    HEP: No homework assigned this session.  Pain Reassessment: Pain was not reassessed as no pain was  reported.  Education:    Education Provided: Problem solving strategies .       Audience: Patient.       Mode: Explanation.  Demonstration.       Response: Verbalized understanding.  Demonstrated skill.  Needs practice.  Needs reinforcement.    ASSESSMENT  Pt with less perseveration today but still some difficulty identifying important  info for planning/organzing purposes. Pt tends to over-analyze. L neglect noted  today as well. Min assist required overall today to begin mod-complex task. Will  continue next session.    Activity/Participation Problem List and Goals: No updates at this time.  Functions/Structures Problem List and Goals: No updates at this time.    PLAN  Treatment Frequency, Duration, and Interventions: Continue Speech/Language  Pathology to achieve goals per previously established Plan of Care.  Development of Plan of Care: There was no change to plan of care today. Patient  and/or family continue to be in agreement with plan of care.  The patient has been instructed to contact our clinic if any questions or  problems should arise.    Visit Number: Today's visit is number  8    Signed by: Ranee Gosselin, M.S., CCC-SLP 01/04/2015 2:00:00 PM

## 2015-01-08 NOTE — Rehab Progress Note ([facility]) (Signed)
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 Ellen Henri    Demographics:            Age: 26Y            Gender: Male    Rehabilitation Precautions/Restrictions:   No Driving  Visual orgnaization issues    SUBJECTIVE  Patient Report: "This was more taxing then I thought"  Patient/Caregiver Goals: "I want to get back to being my normal self. Get back  to my old routine"  "I would like to see better and to drive again"  Pain: Patient currently without complaints of pain.    OBJECTIVE  General Observation: Pt received on time, motivated to engage in treatment  session.    Interventions:       Therapeutic Activities:  MVPT-3 initiated- unable to be completed in full  treatment session due to patient's decreased ocular activity tolerance.  Assessment to be completed at next scheduled appointment.  Pain Reassessment: Pain was not reassessed as no pain was reported.  Education:    Education Provided: Plan of care.       Audience: Patient.       Mode: Explanation.       Response: Applied knowledge.    ASSESSMENT  Pt and his wife educated on need for followup with optometrist for visual field  and acuity testing needs. As well as continued recommendation of purchasing a  book light to assist with low vision needs. Pt continues to benefit from ongoing  OP OT 2x/week to assist with visual perceptual remediation skills.    Activity/Participation Problem List and Goals: No updates at this time.  Functions/Structures Problem List and Goals: No updates at this time.    PLAN  Treatment  Frequency, Duration, and Interventions: Continue Occupational Therapy  to achieve goals per previously established Plan of Care.  Development of Plan of Care: There was no change to plan of care today. Patient  and/or family continue to be in agreement with plan of care.  The patient has been instructed to contact our clinic if any questions or  problems should arise.    Visit Number: Today's visit is number  8    Signed by: Willy Eddy, OTR/L 01/08/2015 2:00:00 PM

## 2015-01-08 NOTE — Rehab Evaluation ([facility]) (Signed)
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 Ellen Henri  Referring Service/Team:  Medicine  Rehabilitation Precautions/Restrictions:   No driving. Visual Disturbances    SUBJECTIVE  Patient Report: "The thing is, I don't really remember how we got to this  point." - Pt. reported when coming back to the moderately complex problem  solving task started last session. Pt. with some difficulty remembering and  re-familiarizing himself with activity at first.  Patient/Caregiver Goals: To return to work in some capacity  Pain: Patient currently without complaints of pain.    OBJECTIVE  General Observation: Pt. arrived on time to session after occupational therapy  and somewhat fatigued from vision testing session. Session completed with  additional lamp provided by OT to assist with low vision deficits during  reading/writing activities. Supervising therapist present in the room, guiding  and directing treatment and plan of care.    Interventions:       Speech Treatment: Problem solving / organization activity: Pt. required min  assist to complete moderately complex problem solving written task started last  session. Pt. with some difficulties due to perseveration on extraneous  information but benefitted from verbal cues to "focus on what is most  important." Pt. and clinician discussed tendency for Pt. to get distracted by  ambiguities, as well as the benefit of strategies such as using colored writing  utensils for saliency and writing notes during problem solving on important  information  and "stuff that's already been figured out."    Edging technique: Pt. required supervision level to min assist to effectively  utilize edging teghnique to compensate for left inattention throughout session.    HEP: No homework assigned this session.  Pain Reassessment: Pain was not reassessed as no pain was reported.  Education:    Education Provided: Problem solving strategies .       Audience: Patient.       Mode: Explanation.  Demonstration.       Response: Verbalized understanding.  Demonstrated skill.  Needs practice.  Needs reinforcement.    ASSESSMENT  Pt. continues to have some difficulty with identifying salient information and  filtering out non-essential information for purposed of problem solving.  Difficulty complicated by working memory and visual deficits as well. However,  after discussion, pt verbalizing some insight/understanding into this issue  today. Pt. with some difficulty with left neglect today, but progressing with  utilization of compensatory edging strategies. Pt. remains motivated and  receptive to education.    Activity/Participation Problem List and Goals: No updates at this time.  Functions/Structures Problem List and Goals: No updates at this time.    PLAN  Treatment Frequency, Duration, and Interventions: Continue Speech/Language  Pathology to achieve goals per previously established Plan of Care.  Development of Plan of Care: There was no change to plan of care today. Patient  and/or family continue to be in agreement with plan of  care.  The patient has been instructed to contact our clinic if any questions or  problems should arise.    Visit Number: Today's visit is number  9    Signed by: Daron Offer, SLP Student 01/08/2015 3:00:00 PM     - CoSigned By: Blake [name], M.S., CCC-SLP 01/08/2015 8:45:04 PM

## 2015-01-08 NOTE — Rehab Progress Note ([facility]) (Signed)
NAMEREADE TREFZ  MRN: [id number]  Account: [id number]  Session Start: 01/08/2015 3:00:00 PM  Session Stop: 01/08/2015 4:00:00 PM    Physical Therapy  Outpatient Treatment Note    Medical Diagnosis:  R PCA CVA  Therapy Diagnosis:  Rank Code      Description     Date of Onset  1    R27.8     Other lack of coordination                       12/12/2014  Demographics:            Age: 44Y            Gender: Male    Rehabilitation Precautions/Restrictions:   No Driving  Visual orgnaization issues    SUBJECTIVE  Patient Report: "I noticed this baby carriage going by and I though hmm that's  weird no one is pushing it and for a few second I believed it then I turned my  head and saw the mom."  Patient/Caregiver Goals: "To be able to make a well founded decision to  returning to work and figuring out how to make that decision.  To understand why  or why not I should return to work."  Pain: Patient currently without complaints of pain.    OBJECTIVE  General Observation: pt arrived on time to PT appt without wife present  Other/Additional Findings: n/a    Interventions:       Neuromuscular Reeducation:  Gait training over treadmill at 2.4- 2.6 miles  per hour for 10 mins focusing on increased step length and heel strike  progressing to 2 min walking on incline at 2.6 mph, recovery for 2 mins for a  total of 15 mins.  Neuro com training with weighshifts at L 3 into the l  direction with pinwheel, center L, then rockerboard.  Performed with responsive  surroundings and support up to 60%.  Pt was 75-100% compliant.  Performed foam roller to IT band and stretching to glutes, IT band, and  piriformis.  Pain Reassessment: Pain was not reassessed as no pain was reported.  Education:    Education Provided: Plan of care. Home exercise/activity plan.       Audience: Patient.       Mode: Explanation.  Demonstration.       Response: Verbalized understanding.    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  Reports  significant relief from foam roller at IT band.    Activity/Participation Problem List and Goals: No updates at this time.  Functions/Structures Problem List and Goals: No updates at this time.    PLAN  Treatment Frequency, Duration, and Interventions: Continue Physical Therapy to  achieve goals per previously established Plan of Care.  Development of Plan of Care: There was no change to plan of care today. Patient  and/or family continue to be in agreement with plan of care.  The patient has been instructed to contact our clinic if any questions or  problems should arise.    Visit Number: Today's visit is number  9    Signed by: Darrold Span, PT 01/08/2015 4:00:00 PM

## 2015-01-10 NOTE — Rehab Progress Note ([facility]) (Signed)
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 Ellen Henri    Demographics:            Age: 43Y            Gender: Male    Rehabilitation Precautions/Restrictions:   No Driving  Visual orgnaization issues    SUBJECTIVE  Patient Report: "This was taxing on the brain and the eyes"  Patient/Caregiver Goals: "I want to get back to being my normal self. Get back  to my old routine"  "I would like to see better and to drive again"  Pain: Patient currently without complaints of pain.    OBJECTIVE  General Observation: Pt received on time, motivated to engage in treatment  session. OTAS present under direct supervision from MOTR/L.    Interventions:       Therapeutic Activities:  MVPT-3 completed. Pt scoring 63% for age norms  with noted deficits in visual closure, visual memory, form constancy, and figure  ground skills. Pt provided with printed handout outlining visual terminology  from today's testing outcomes.  Pain Reassessment: Pain was not reassessed as no pain was reported.  Education:    Education Provided: Plan of care. Plan of care.       Audience: Patient.       Mode: Explanation.       Response: Applied knowledge.  Verbalized understanding.    ASSESSMENT  Pt's ocular activity tolerance continues to greatly impact his ability to  visually process information within his environment, limiting his safety and  insight with navigation skills. Pt will greatly benefit from continued OP OT  2x/week to further address visual perceptual skills in prep to maximize his  functiona level  of independence.    Activity/Participation Problem List and Goals: No updates at this time.  Functions/Structures Problem List and Goals: No updates at this time.    PLAN  Treatment Frequency, Duration, and Interventions: Continue Occupational Therapy  to achieve goals per previously established Plan of Care.  Development of Plan of Care: There was no change to plan of care today. Patient  and/or family continue to be in agreement with plan of care.  The patient has been instructed to contact our clinic if any questions or  problems should arise.    Visit Number: Today's visit is number  9    Signed by: Willy Eddy, OTR/L 01/10/2015 3:00:00 PM

## 2015-01-10 NOTE — Rehab Progress Note ([facility]) (Addendum)
Corrected 01/10/2015 2:39:26 PM    NAME: Blake [name]  MRN: [id number]  Account: [id number]  Session Start: 01/10/2015 1:00:00 PM  Session Stop: 01/10/2015 2:00:00 PM    Physical Therapy  Outpatient Treatment Note    Medical Diagnosis:  R PCA CVA  Therapy Diagnosis:  Rank Code      Description     Date of Onset  1    R27.8     Other lack of coordination                       12/12/2014  Demographics:            Age: 83Y            Gender: Male    Rehabilitation Precautions/Restrictions:   No Driving  Visual orgnaization issues    SUBJECTIVE  Patient Report: " I have been having this issue where It feels like an elastic  band around my left arm that comes around my back and into my waist and it feels  like it is getting tighter, it comes and goes but I get it mostly in the  mornings."  Patient/Caregiver Goals: "To be able to make a well founded decision to  returning to work and figuring out how to make that decision.  To understand why  or why not I should return to work."  Pain: Patient currently without complaints of pain.    OBJECTIVE  General Observation: pt noted to become quiet at times throughout session likely  due to feeling symptomatic from BP- cueing provided for pt to rest  Other/Additional Findings: BP take post complaints of lightheadedness while  sitting: 89/68 HR 94a    Interventions:       Neuromuscular Reeducation:  Performed treadmill for warm up at 2.0-2.5 mph  x 10 mins.  Standing balance activities including: static standing on foam with  alternating cone tap progressing to 2 consecutive cone taps, single leg stance  progressing to trunk rotations x 5 reps each direction.  Rockerboard static hold  progressing to single leg cone taps.  Pain Reassessment: Pain was not reassessed as no pain was reported.  Education:    Education Provided: Plan of care. Precautions.       Audience: Patient.       Mode: Explanation.  Demonstration.       Response: Verbalized understanding.    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  Pt was aware of symptoms but  did need to be prompted to sit and hydrate.  Recommend ongoing higher level  balance activities to optimal challenge pt and allow for safe return to  community.    Activity/Participation Problem List and Goals: No updates at this time.  Functions/Structures Problem List and Goals: No updates at this time.    PLAN  Treatment Frequency, Duration, and Interventions: Continue Physical Therapy to  achieve goals per previously established Plan of Care.  Development of Plan of Care: There was no change to plan of care today. Patient  and/or family continue to be in agreement with plan of care.  The patient has been instructed to contact our clinic if any questions or  problems should arise.    Visit Number: Today's visit is number  10    Signed by: Darrold Span, PT 01/10/2015 2:00:00 PM

## 2015-01-13 NOTE — Rehab Evaluation ([facility]) (Signed)
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 Visual Disturbances    Patient Report: "There may be some inappropriate facetiousness coming up some at  times." - Pt. during discussion about sometimes seeming to perseverate on  extraneous information.  Patient/Caregiver Goals: To return to work in some capacity  Pain: Patient currently without complaints of pain.  General Observation: Pt. arrived 20 minutes late to session due to trouble with  car, and Pt. reports calling speech office to notify clinicians. Pt. pleasant  and participatory throughout session. Supervising therapist present in the room,  guiding and directing treatment and plan of care.  Interventions:       Speech Treatment: Problem solving: Pt. completed moderately complex problem  solving / organization task independently with extra time, utilizing strategies  of self-talk and note-taking.    Prospective memory: Throughout session, Pt. completed 3 prospective memory tasks  independently. Tasks included prospective delays from 2 to 10 minutes.    Mental flexibility: Pt. given mental flexibility portion of CLQT on which in 3  minutes, he was able to draw 7 different patterns to with 4 lines given 4 dots,  with only 1 repetition of pattern already drawn. Pt. scored 7, average for age  group is 6.  Pain Reassessment: Pain was not reassessed as no pain was reported.  Education:    Education Provided: Problem solving and memory  strategies .       Audience: Patient.       Mode: Explanation.  Demonstration.       Response: Verbalized understanding.  Demonstrated skill.  Needs practice.  Needs reinforcement.    Initial Evaluation Date: 12/11/2014 12:00:00 PM  Reporting Period: 12/11/14 - 01/10/15  Number of Visits to Date: Today's visit is number  10    Activity/Participation Problem List and Goals: Attention  Goal: Goal: LTG (30 visits from 12/11/14) Pt will utilize edging strategies to  compensate for neglect in complex reading, writing and information management  (e.g. filling out pt report) with no cues 90% of the time. GOAL IN PROGRESS    STG (10 visits from 12/11/14) Pt will utilize edging strategies to compensate for  neglect in moderately complex reading, writing and information management (e.g.  filling out pt report) with no cues 90% of the time.  GOAL PARTIALLY MET    Updated Goals:    Goal: LTG (20 visits from 01/10/15) Pt will utilize edging strategies to  compensate for neglect in complex reading, writing and information management  (e.g. filling out pt report) with no cues 90% of the time.    STG (10 visits from 01/10/15) Pt. will utilize edging strategies to compensate for  neglect in moderately complex information management (e.g., filling out Pt.  report, informational chart/diagram use and completion) with no cues 90% of the  time.  .  Status: Pt. with steady progress this reporting period, meeting STG for  attention for reading and writing tasks. Updated  goal to focus on edging  strategy use for information management.   Spoken Language Expression  Goal: Goal: LTG (30 visits from 12/11/14) Pt will monitor for clarity, relevancy  and organization to convey complex information (e.g. mock interview with pt)  with minimal clarification from unfamiliar listener. GOAL MET    STG (10 visits from 12/11/14) Pt will monitor for clarity, relevancy and  organization to convey moderately complex information (e.g. mock interview with  pt) with occasional  reminders (<2 across 10 min)  GOAL MET  .  Status: Pt. with excellent progress for spoken language expression this  reporting period, meeting both STG and LTG for verbally communicating  work-specific complex information.  .  Functions/Structures Problem List and Goals:  Problem Solving: LTG (30 visits from 12/11/14) Pt will utilize learned strategies  to compensate for cognitive deficits for effective management of complex home,  personal, community and work responsibilities at supervision to functional  independence assist level.  GOAL IN PROGRESS; UPDATED    STG (10 visits from 12/11/14) Pt will utilize learned strategies to compensate for  cognitive deficits for effective management of moderately complex tasks given  extra time only.  GOAL MET  Updated goals:  Problem Solving: LTG (20 visits from 01/10/15) Pt. will demonstrate appropriate  mental flexibility and utilize learned strategies to compensate for cognitive  deficits for effective management of complex home, personal, community and work  responsibilities at supervision to functional independence assist level.    STG (10 visits from 01/10/15) Pt will demonstrate appropriate mental flexibility  and utilize learned strategies to compensate for cognitive deficits for  effective management of complex tasks given min assist and extra time.  .  Status: Pt. with steady progress this reporting period, meeting STG for problem  solving. New STG and LTG updated to reflect additional focus on mental  flexibility.    Progress Summary: Pt. with steady progress this reporting period, meeting 1 LTG,  2/3 STGs, and partially meeting the third STG. Pt.?s progress with utilizing  strategies to compensate for left neglect has been most evident in reading and  writing activities, with Pt. independently employing these strategies of edging  and double-checking nearly 100% of the time. Left neglect has, however,  interfered somewhat with tasks during sessions when Pt. has attempted to  refer  to or complete information in charts/diagrams where information on left side is  less obviously ?missing.? New STG to address use of edging strategies more  specifically with chart and diagram tasks. Pt. also with significant progress  with verbal expression of moderately complex and complex information. Pt. with  most notable progress in this area when verbally communicating job related  complex information displaying appropriate organization and clarity. In terms of  problem solving, Pt. with progress in this area, meeting STG of utilizing  learned strategies (note-taking, self-talk) to complete moderately complex tasks  given extra time. New problem solving goals now to focus on complex tasks, as  well as mental flexibility?which has sometimes been a barrier with Pt. needing  assist to focus on relevant information and not to perseverate on extraneous or  ambiguous information. In addition, prospective memory tasks will continue to be  incorporated throughout sessions and during problem solving tasks. Overall, Pt.  continues to benefit from speech-language therapy in order to meet goals stated  above and to return to previous roles/responsibilities.  Equipment Provided: None issued this visit.  Rehabilitation Potential:  Patient?s condition has potential to improve.  Patient is improving  in response to therapy.  Maximum improvement is yet to be attained.  I expect that the anticipated improvement is attainable in a  reasonable/predictable period of time.  Necessity: Patient requires speech language therapy plan in order to return to  Premorbid environment (or reside in new living environment).  Patient requires speech language therapy plan in order to return to work.  Recommended Consults:  Recommendations: Speech/Language Pathology services recommended for 2x/week for  20 visits; groups as indicated Speech/Language Pathology treatment is to  include: Treat speech, language, voice, communication, and/or  auditory  processing disorder. Group treat speech, language, voice, communication, and/or  auditory processing disorder.    Physician Certification: This is to certify that the above named patient, who is  under my care, requires skilled Speech/Language Pathology services as described  in the above treatment plan.  I further certify that the services outlined in  this plan are skilled and medically necessary.  I have reviewed this plan for  rehabilitation services, and I recommend that these services continue for 10  visits  to meet the goals stated above.    Physician signature: __________________ MD,  Date of certification:  ____/____/____    Signed by: Daron Offer, SLP Student 01/10/2015 12:00:00 PM     - CoSigned By: Ranee Gosselin, M.S., CCC-SLP 01/13/2015 7:28:53 PM

## 2015-01-14 NOTE — Rehab Progress Note ([facility]) (Signed)
Blake [name]  MRN: [id number]  Account: [id number]  Session Start: 01/14/2015 2:00:00 PM  Session Stop: 01/14/2015 3:00:00 PM    Physical Therapy  Outpatient Treatment Note    Medical Diagnosis:  R PCA CVA  Therapy Diagnosis:  Rank Code      Description     Date of Onset  1    R27.8     Other lack of coordination                       12/12/2014  Demographics:            Age: 25Y            Gender: Male    Rehabilitation Precautions/Restrictions:   No Driving  Visual orgnaization issues    SUBJECTIVE  Patient Report: "I had a relaxing weekend without any issues."  Patient/Caregiver Goals: "To be able to make a well founded decision to  returning to work and figuring out how to make that decision.  To understand why  or why not I should return to work."  Pain: Patient currently without complaints of pain.    OBJECTIVE  General Observation: no new complaints  Other/Additional Findings: n/a    Interventions:       Neuromuscular Reeducation:  Performed negotiation tasks throughout the  hospital while implementing edging strategies- pt needed min cueing x 2 but the  rest of the time he implemented strategies without assistance.  Performed  standing balance activities including: rockerboard, rockerboard with VOR x 1  viewing, bosu ball static standing, standing with squats, upside down static  holds progressing to multidirectional head turns, gait training with  multidirectional head turns.  Pain Reassessment: Pain was not reassessed as no pain was reported.  Education:    Education Provided: Plan of care. Precautions. Home exercise/activity plan.       Audience: Patient.       Mode: Explanation.  Demonstration.  Teacher, English as a foreign language provided.       Response: Verbalized understanding.    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  Mild LOB noted with head turns during gait.    Activity/Participation Problem List and Goals: No updates at this  time.  Functions/Structures Problem List and Goals: No updates at this time.    PLAN  Treatment Frequency, Duration, and Interventions: Continue Physical Therapy to  achieve goals per previously established Plan of Care.  Development of Plan of Care: There was no change to plan of care today. Patient  and/or family continue to be in agreement with plan of care.  The patient has been instructed to contact our clinic if any questions or  problems should arise.    Visit Number: Today's visit is number  11    Signed by: Darrold Span, PT 01/14/2015 3:00:00 PM

## 2015-01-14 NOTE — Rehab Progress Note ([facility]) (Signed)
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      Visual disturbances                              12/11/2014  2    H53.452   Other localized visual field defect, left eye    12/11/2014  Demographics:   Age: 37Y   Gender: Male  Medications and Allergies: Significant rehabilitation considerations:   NKDA  Rehabilitation Precautions/Restrictions:   No Driving  Visual orgnaization issues    Patient Report: "I used to do these things all the time, this is hard for me  now!"  Patient/Caregiver Goals: "I want to get back to being my normal self. Get back  to my old routine"  "I would like to see better and to drive again"  Pain: Patient currently without complaints of pain.  General Observation: Pt received on time, motivated to engage in treatment  session, OTAS present under direct supervision from MOTR/L.  Interventions:       Therapeutic Activities:  Pt engaged in visual perceptual activity targeting  visual cognitive and processing skills using a distracting background. Pt  required increased time to complete task, unable to finish in session. OT issued  the remainder of task for homework to assist with carryover of visual perceptual  skills.  Pain Reassessment: Pain was not reassessed as no pain was reported.  Education:    Education Provided: Plan of care.       Audience: Patient.       Mode: Explanation.       Response: Applied knowledge.  Verbalized understanding.  Needs reinforcement.    Initial Evaluation Date: 12/11/2014 3:00:00 PM  Reporting Period: 12/11/14 to 01/14/15  Number of Visits to Date: Today's visit is number  10      Activity/Participation Problem List and Goals: Self Care  Goal: LTG(20 visits from 01/14/15): Pt will  demonstrate the ability to prepare a  basic hot meal at an independent level in order to resume kitchen based tasks.  GOAL IN PROGRESS    LTG(20 visits from 01/14/15): Pt will be able to navigate 5 areas in the hospital  using auditory and visual directions at a Mod I level in order to be able to  safely navigate to familiar areas of [facility] for work related needs. GOAL IN  PROGRESS    STG(10 visits from 12/11/14):  Pt will locate 3 items from gift shop with auditory  and visual directions with 4 verbal cues or less in prep to improve self  navigation skills for community integration needs. GOAL MET BY 01/14/15    .  Status: Pt MET STG by 01/14/15. Pt's visual and cognitive processing deficits  continue to impact his functional independence with returning to IADL, ADL, and  work related needs. Goals remain appropriate for the pt with STGs in  functions/structurs supporting remaining LTGS in activty/participation.      Functions/Structures Problem List and Goals:  Vision: LTG(20 visits from 12/11/14): Pt will complete MVPT-3 in 1 treatment  session in order to sustain ocular activity level  required to read an electronic  medical chart for return to work needs. GOAL NOT MET, BUT ASSESSMENT COMPLETED-  GOAL D/C    LTG(20 visits from 01/14/15): Pt will hit 60 targets in 60 seconds on BITs  targeting all visual quandrants in order to increase visual awareness with  locating moving objects/people when walking. GOAL IN PROGRESS    LTG(20 visits from 01/14/15): Pt will improve score on Hooper's Visual  Organization Test by 5 points or greater in order to demonstrate the ability to  accurately identify and visually organize objects within his environment without  cues. GOAL IN PROGRESS    STG(10 visits from 12/11/14): Pt will sustain the ability to focus on a busy  background to locate 7 items or more for at least 20 minutes in prep to be able  to sustain focus on a computer screen to read research articles. GOAL MET BY  01/14/15    NEW  GOAL:  LTG(20 visits from 01/14/15): Pt will improve score on MVPT-3 by 20 percentage  points or more in order to demonstrate improvement with visual perceptual  processing needs required for behind the wheel activities.    STG( 10 visits from 01/14/15): Pt will demonstrate the ability to alternate his  visual attention by researching 10 pieces of information from internet in a  distracting environment within 30 minutes in order to resume computer based work  tasks.        .  Status: MVPT-3 goal met within 2 treatment sessions, thus being discharged with  new goal reflecting outcome. New STG formulated to further address visual  attention needs. Pt MET sustained focus goal, but continues to demonstrate  difficulty with prolonged ocular activity tolerance for longer than 30 minutes  at one time.    .    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 However, as a result of continued visual  perceptual and cognitive processing impairments, he continues to exhibit  difficulty with understanding visual and abstract information throughout his  environment, topographical orientation, and sustained ocualr activity tolerance  with prolonged tasks, 30 min or greater. 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 Pt is  aware. 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  Equipment Provided: None issued this visit.  Rehabilitation Potential: Patient?s condition has potential to improve.  Patient is improving in response to therapy.  Maximum improvement is yet to be attained.  I expect that the  anticipated improvement is attainable in a  reasonable/predictable period of time.  Necessity: Patient requires outpatient therapy in order to reduce Activities of  Daily Living or Instrumental Activities of Daily Living assistance to a  premorbid level.  Patient requires occupational therapy plan in order to return to work.  Patient requires occupational therapy plan in order to function in community.  Recommended Consults:  Opthamology., Optometrist.  Recommendations: Occupational Therapy is recommended for 2x/week for at least 20  visits for 1:1 and group sessions starting from 01/14/15 Occupational Therapy  treatment is to include: Therapeutic Exercise.  Neuromuscular Re-education.  Group Therapeutic Procedure.  Therapeutic Activity.  Self Care/Home Management.  Community/Work Integration.    Physician Certification: This is to certify that the above named patient, who is  under my care, requires skilled Occupational Therapy services as described in  the above treatment plan.  I further certify that the services outlined in this  plan are skilled and medically necessary.  I have reviewed this plan for  rehabilitation services, and I recommend that these services continue for 10  visits from 01/14/15  to meet the goals stated above.    Physician signature: __________________ MD,  Date of certification:  ____/____/____    Signed by: Willy Eddy, OTR/L 01/14/2015 2:00:00 PM

## 2015-01-15 ENCOUNTER — Encounter (INDEPENDENT_AMBULATORY_CARE_PROVIDER_SITE_OTHER): Payer: Self-pay

## 2015-01-15 NOTE — Rehab Evaluation ([facility]) (Signed)
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 Ellen Henri  Referring Service/Team:  Medicine  Rehabilitation Precautions/Restrictions:   No driving. Visual Disturbances    SUBJECTIVE  Patient Report: I figured that was more what you were interested in... I decided  to stop really obsessing about the details." - Pt. referring to not  perseverating on ambiguities in HEP assignment.  Patient/Caregiver Goals: To return to work in some capacity  Pain: Patient currently without complaints of pain.    OBJECTIVE  General Observation: Pt. arrived on time to session from physical therapy  session. Supervising therapist present in the room, guiding and directing  treatment and plan of care.    Interventions:       Speech Treatment: HEP: Pt. i'ly completed prospective memory tasks  (including instructing family member to call and relay specific information to  clinicians) throughout the time since previous session.    Complex problem solving: Pt. completed written problem-solving task including  alternating attention, visual organization, and deductive reasoning. Pt.  required min assist to complete task utilizing learned strategies including note  taking, visual saliency, and edging with a diagram.    HEP: No homework for today.  Pain Reassessment: Pain was not reassessed as no pain was reported.  Education:    Education Provided: Problem solving strategies .       Audience: Patient.       Mode: Explanation.  Demonstration.       Response: Verbalized understanding.  Demonstrated skill.  Needs  practice.  Needs reinforcement.    ASSESSMENT  Pt. with successful use of edging strategies for more visually demanding task  today. Pt. also continues to be receptive to education, and benefit from verbal  cues to utilize strategies.    Activity/Participation Problem List and Goals: No updates at this time.  Functions/Structures Problem List and Goals: No updates at this time.    PLAN  Treatment Frequency, Duration, and Interventions: Continue Speech/Language  Pathology to achieve goals per previously established Plan of Care.  Development of Plan of Care: There was no change to plan of care today. Patient  and/or family continue to be in agreement with plan of care.  The patient has been instructed to contact our clinic if any questions or  problems should arise.    Visit Number: Today's visit is number  11    Signed by: Daron Offer, SLP Student 01/14/2015 4:00:00 PM     - CoSigned By: Ranee Gosselin, M.S., CCC-SLP 01/15/2015 10:28:14 AM

## 2015-01-16 NOTE — Progress Notes (Signed)
[name]  MRN: [id number]  Account: [id number]  Session Start: 01/14/2015 11:00:00 AM  Session Stop: 01/14/2015 12:00:00 PM    Outpatient Social Work Note    Medical Diagnosis:  Stroke  Demographics:            Age: 55Y            Gender: Male    Met with patient and wife and looked at the thoughts of returning to work and  retiring, and also the long term impacts this would hold. Also discussed LTD and  finances.    Signed by: Delbert Phenix, LGSW 01/14/2015 12:00:00 PM

## 2015-01-17 NOTE — Rehab Progress Note ([facility]) (Signed)
Blake [name]  MRN: [id number]  Account: [id number]  Session Start: 01/17/2015 2:00:00 PM  Session Stop: 01/17/2015 3:00:00 PM    Physical Therapy  Outpatient Progress Summary    Medical Diagnosis:  R PCA CVA  Therapy Diagnosis:  Rank Code      Description     Date of Onset  1    R27.8     Other lack of coordination                       12/12/2014  Demographics:   Age: 43Y   Gender: Male  Medications and Allergies: Significant rehabilitation considerations:   NKDA  Rehabilitation Precautions/Restrictions:   No Driving  Visual orgnaization issues  Initial Evaluation Date: 12/11/2014 4:00:00 PM  Reporting Period: 1/5-2/11    Patient Report: "I had this ache in my toe yesterday that was just irritating  me."  Patient/Caregiver Goals: "To be able to make a well founded decision to  returning to work and figuring out how to make that decision.  To understand why  or why not I should return to work."  Pain: Patient currently without complaints of pain.  General Observation: no new complaints, pt reports he lost his scarf and cannot  find it, believes he may have lost it in PT  Interventions:       Therapeutic Activities:  Discussed POC, goals, and ongoing PT needs.  Pt in  agreement.  Discussed community resources as well as overall stroke prevention  and pts plan for walking and use of bike in basement.  Pt continues with mild  glute med and low back pain on the L side.  Pt reports he is planning to order a  foam roller to relieve IT band symptoms but discsused follow up with  manual/ortho based PT to assist with back/hip pain.  Pt performed navigation  tasks last session with only min VC for 2/5 tasks- unable to complete today due  to time.  Special Tests:  SOT: 69 composite,WNL overall,  below norms on 2/3 trials on  condition 4.  FGA:27/30  (previous was 24/30) - see belwo  Pain Reassessment: Pain was not reassessed as no pain was reported.  Education:    Education Provided: Precautions. Plan of care.        Audience: Patient.       Mode: Explanation.  Demonstration.       Response: Verbalized understanding.    Number of Visits to Date: Today's visit is number  12    Activity/Participation Problem List and Goals: Mobility: Walking and Moving  Around  Goal: LTG( 20 visits from 1/5):  Pt will score WNL on the SOT to appropriate use  sensory systems for balance - MET    STG( 10 visits from 1/5):  Pt will score WNL on the FGA to reduce fall risk  -  PROGRESSING    LTG ( 20 visits from 1/5): Pt will access community setting including navigating  over uneven ground, curbs, ramps at an I level -PROGRESSING    STG ( 10 visits from 1/5):  Pt will complete a navigation task within the  hospital with minimal cueing only  - MET    LTG ( 20 visits from 1/5):  Pt will complete a naviation task within the  familiar setting of the hospital with S from therapist - PROGRESSING  Status: Pt has met his STG and is progressing toward his LTG, remains on track  to meet  goals within the next month.  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  He is more aware of his current deficits and is beginning to compensate  well for field cut utlizing edging strategies during navigation tasks.  His  balance is also improving currently he has most difficult with R and L head  turns during gait.  Pt has been educated on HEP as well as stroke  prevention.  Anticipate pt will meet LTG over next reporting period.  Equipment Provided: None issued this visit.  Rehabilitation Potential:  Patient?s condition has potential to improve.  Patient is improving in response to therapy.  Maximum improvement is yet to be attained.  I expect that the anticipated improvement is attainable in a  reasonable/predictable period of time.  Necessity: Patient requires physical therapy plan in order to return to  Premorbid environment (or reside in new living environment).  Patient requires physical therapy plan in order to return to work.  Patient requires physical therapy plan in order to function in community.  Patient requires outpatient, skilled physical therapy in order to maximize  independence and safety while reducing burden of care during functional mobility  in the home.  Recommended Consults: None currently.  Recommendations: Continue Physical Therapy to achieve goals per previously  established Plan of Care.    Physician Certification: This is to certify that the above named patient, who is  under my care, requires skilled Physical Therapy services as described in the  above treatment plan.  I further certify that the services outlined in this plan  are skilled and medically necessary.  I have reviewed this plan for  rehabilitation services, and I recommend that these services continue for 10  visits from 01/17/15  to meet the goals stated above.    Physician signature: __________________ MD,  Date of certification:  ____/____/____    Signed by: Darrold Span, PT 01/17/2015 3:00:00 PM

## 2015-01-17 NOTE — Rehab Progress Note ([facility]) (Signed)
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 Get back  to my old routine"  "I would like to see better and to drive again"  Pain: Patient currently without complaints of pain.    OBJECTIVE  General Observation: Pt received on time, motivated to engage in session, OTAS  present under direct supervision from MOTR/L.    Interventions:       Therapeutic Activities:  Pt engaged in visual perceptual activity targeting  visual cognitive and processing skills using a distracting background in prep  for return to work needs. Pt able to successfully hold lenghty conversations,  while completing task, which is the first time the pt as been able to multitask  in this environment since start in OP OT.  OT issued the remainder of task for  homework to assist with carryover of visual perceptual skills.  Pain Reassessment: Pain was not reassessed as no pain was reported.  Education:    Education Provided: Plan of care.       Audience: Patient.       Mode: Explanation.       Response: Applied knowledge.  Verbalized understanding.    ASSESSMENT  Pt's visual perceptual deficits continue to limit his functional and  cognitive  abilities to regulate information throughout a distracting environment. Pt will  continue to benefit from continued OP OT to further address visual perceptual  deficits in order to optimize his functional independence needed to return to  reading and computer based occupational tasks. Pt is making steady progress  towards outlined goals and continues to be motivated in sessions.    Activity/Participation Problem List and Goals: No updates at this time.  Functions/Structures Problem List and Goals: No updates at this time.    PLAN  Treatment Frequency, Duration, and Interventions: Continue Occupational Therapy  to achieve goals per previously established Plan of Care.  Development of Plan of Care: There was no change to plan of care today. Patient  and/or family continue to be in agreement with plan of care.  The patient has been instructed to contact our clinic if any questions or  problems should arise.    Visit Number: Today's visit is number  11    Signed by: Willy Eddy, OTR/L 01/17/2015 2:00:00 PM

## 2015-01-18 NOTE — Rehab Evaluation ([facility]) (Signed)
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 Ellen Henri  Referring Service/Team:  Medicine  Rehabilitation Precautions/Restrictions:   No driving. Visual Disturbances    SUBJECTIVE  Patient Report: "I have to show you this ultimate speech and OT tool." - Pt.  referring to bookmark / straight edge with finger on it that could be used to  point to the left.  Patient/Caregiver Goals: To return to work in some capacity  Pain: Patient currently without complaints of pain.    OBJECTIVE  General Observation: Pt. arrived on time to session from physical therapy  session. Pt. pleasant and participatory. Supervising therapist present in the  room, guiding and directing treatment and plan of care.    Interventions:       Speech Treatment: Problem solving / visual attention: Pt. completed complex  problem solving / visual attention task involving visual scanning, alternating  visual attention, and problem solving strategies such as pre-planning and  double-checking. Pt. required supervision level to min assist for visual  attention, and min-mod assist for strategy development and utilization.    HEP: No homework for today.  Pain Reassessment: Pain was not reassessed as no pain was reported.  Education:    Education Provided: problem solving; visual attention .       Audience: Patient.       Mode: Explanation.  Demonstration.       Response: Verbalized understanding.  Demonstrated skill.  Needs practice.  Needs reinforcement.    ASSESSMENT  Pt. continues to be receptive to education, motivated,  and receptive to  education.    Activity/Participation Problem List and Goals: No updates at this time.  Functions/Structures Problem List and Goals: No updates at this time.    PLAN  Treatment Frequency, Duration, and Interventions: Continue Speech/Language  Pathology to achieve goals per previously established Plan of Care.  Development of Plan of Care: There was no change to plan of care today. Patient  and/or family continue to be in agreement with plan of care.  The patient has been instructed to contact our clinic if any questions or  problems should arise.    Visit Number: Today's visit is number  12    Signed by: Daron Offer, SLP Student 01/17/2015 4:00:00 PM     - CoSigned By: Ranee Gosselin, M.S., CCC-SLP 01/18/2015 8:40:39 AM

## 2015-01-21 NOTE — Progress Notes (Signed)
Blake [name]  MRN: [id number]  Account: [id number]  Session Start: 01/08/2015 12:00:00 PM  Session Stop: 01/08/2015 1:00:00 PM    Outpatient Social Work Initial Assessment    Medical Diagnosis:  Stroke  Demographics:            Age: 64Y            Gender: Male    Primary Language: English  Referring Clinician: Treatment Team  Payer Sources: Primary Payer Source: [employer].  Secondary Payer Source: None.    Medical Care Prior to Current Episode: None mentioned  Past Medical History: HTN  Observed Behaviors:  Anxious.  Appears to be depressed.  Confused.  Frustrated.  Psychosocial History:   None.    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 MRI of the brain revealed a right thalamic/occipital area CVA. A  few days prior to admission, he started experiencing headaches and  left-sided numbness and left visual field cut. 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 At the time of  discharge,  his functional status improved to modified independent level. 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  Illness Severity or Complexity:  Financial means and if able to return to the  high level of work.  Premorbid Functional Level: Patient reported indep with all ADLs, working full  time (neurologist), driving  Demographics: (Source of HX, Pre-Hosp, Marital, Income, Vocation, [place])  Source of History: Patient.  Pre-Hospital Living  Environment: Home.  Marital Status: Married.  Income Source:   Furniture conservator/restorer.    Vocational  Status: Employed.  Occupation: Insurance account manager Full time.  Military Status: Patient and/or spouse is not a Cytogeneticist.  Concurrent Services: Physical Therapy.  Occupational Therapy.  Speech Language Pathology.  Rehabilitation Precautions/Restrictions:   No driving. Visual Disturbances    Patient/Caregiver Goals:  Patient's functional goals: To be financially stable  to know if he can return to work.  Patient Perceived Primary Stressors:   Financial: Patient has many financial burdens and not much income coming in.   Employment: If patients can, should or wants to return to work.  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 Patient has had some financial difficulties, but has been able to make  ends meet until this point.    Family Contact Information: Patient does not have a primary contact.  Contact/Guardian Information:  Patient does not have a guardian.  Financial Concerns: Many financial concerns. SW is attempting to help them get  some income so they can start paying a majority of their bills  Special Needs:   Income assistance.  Financial assistance.  Mental health services.    ASSESSMENT  Strengths: Patient has a supportive wife who is helping them through the process  and a family who is helping them weigh their options.    Short Term Goals: Time frame to achieve short term goal(s): To help them get  income to help start paying off some of their bills.  Long Term Goals: Time frame to achieve long term goal(s): To assist with  adjustment and questions they have regarding about their future plans.    PLAN  Development of Plan of Care: Patient and family participated in and agreed to  plan of care development today.    Signed by: Delbert Phenix, LGSW 01/08/2015 1:00:00 PM

## 2015-01-22 NOTE — Rehab PPS CMG ([facility]) (Addendum)
Corrected 01/22/2015 12:00:26 PM    NAME: Blake [name]  MRN: [id number]  Account: [id number]  Session Start: 11/27/2014 12:00:00 AM  Session Stop: 11/27/2014 12:00:00 AM    PPS CMG Coordinator  Inpatient Rehabilitation Discharge    Mode of Locomotion: Walking.    Discharge Against Medical Advice:  No.  Discharge Information: Patient Discharged Alive:  Yes  Discharge Destination/Living Setting: Home.  At discharge, the patient was discharged to live (with) (02)  Family / Relatives        Impairment Group: Stroke: 01.4 No Paresis    Comorbidities:  Rank Code      Description    1    R27.0     Ataxia, unspecified  2    I10.      Essential (primary) hypertension  3    E78.5     Hyperlipidemia, unspecified  4    H53.452   Other localized visual field defect, left eye  5    I67.1     Cerebral aneurysm, nonruptured  6    G43.909   Migraine, unspecified, not intractable, without                 status migrainosus  7    E66.3     Overweight  8    Z68.32    Body mass index (BMI) 32.0-32.9, adult  9    R41.89    Other symptoms and signs involving cognitive                 functions and awareness      ********************************        ********************************  PAI Bladder Accidents: 0  - Accidents.  Patient used medications/device this  shift.  11/19/2014 11:45:00 PM  Bladder Score = 6. Patient has not had an accident, but uses a  device/medication.  PAI Bowel Accident: 0  - Accidents.  Patient used medications/device this shift.   11/27/2014 2:01:00 AM  Bowel Score = 6. Patient has no accidents, but uses a device/medications.      MEDICAL NEEDS  Swallowing Status: Swallowing Status: Regular Food: solids and liquids swallowed  safely without supervision or modified food consistencies.    QUALITY INDICATORS  Pressure Ulcers - Discharge: Unhealed Pressure Ulcer(s) Present on Discharge:  No.  Number of Stage 1 pressure ulcers present on Admission and have completely  closed upon Discharge: 0  Number of Stage 2  pressure ulcers present on Admission and have completely  closed upon Discharge: 0  Number of Stage 3 pressure ulcers present on Admission and have completely  closed upon Discharge: 0  Number of Stage 4 pressure ulcers present on Admission and have completely  closed upon Discharge: 0    O0250.Influenza Vaccine - Discharge: Received in this facility for this year's  influenza vaccination season:  No.  Influenza Vaccine Not Received Due To: Received outside of this facility.    Signed by: Kendra Opitz, PPS Coordinator 11/27/2014 4:00:00 PM

## 2015-01-23 NOTE — Rehab Progress Note ([facility]) (Signed)
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 Ellen Henri    Demographics:            Age: 39Y            Gender: Male    Rehabilitation Precautions/Restrictions:   No Driving  Visual orgnaization issues    SUBJECTIVE  Patient Report: "It has been an emotional last couple of days, my father in law  passed away"  Patient/Caregiver Goals: "I want to get back to being my normal self. Get back  to my old routine"  "I would like to see better and to drive again"  Pain: Patient currently without complaints of pain.    OBJECTIVE  General Observation: Pt received on time for treatment. OTAS present under  direct supervision from MOTR/L.    Interventions:       Therapeutic Activities:  BITs activity completed- please see below for  further information. The remainder of session tageted visual cognitive skills in  order to be able to improve his safety and independence with community mobility  needs. Pt able to sustain attention, with 3 times of loss of focus. Pt able to  self correct to remain on task.    BITS:  User Pace Mode  Trial 1: 59/60 targets, 1.19 RT, 70 sec  Trial 2: 60/60 targets, 1.01 RT, 60 sec***      Pain Reassessment: Pain was not reassessed as no pain was reported.  Education:    Education Provided: Plan of care.       Audience: Patient.       Mode: Explanation.       Response: Applied knowledge.  Verbalized understanding.    ASSESSMENT  Pt MET BITs goal in today's session. However, pt's L visual field cut and  cognitive deficits continue to greatly impact his  functional performance with  community navigation tasks. Pt will benefit from ongoing OP OT 2x/week to  further address visual and cognitive remediation needs.    Activity/Participation Problem List and Goals: No updates at this time.  Functions/Structures Problem List and Goals: No updates at this time.    PLAN  Treatment Frequency, Duration, and Interventions: Continue Occupational Therapy  to achieve goals per previously established Plan of Care.  Development of Plan of Care: There was no change to plan of care today. Patient  and/or family continue to be in agreement with plan of care.  The patient has been instructed to contact our clinic if any questions or  problems should arise.    Visit Number: Today's visit is number  12    Signed by: Willy Eddy, OTR/L 01/23/2015 2:00:00 PM

## 2015-01-23 NOTE — Rehab Progress Note ([facility]) (Signed)
Blake [name]  MRN: [id number]  Account: [id number]  Session Start: 01/23/2015 3:00:00 PM  Session Stop: 01/23/2015 4:00:00 PM    Physical Therapy  Outpatient Treatment Note    Medical Diagnosis:  R PCA CVA  Therapy Diagnosis:  Rank Code      Description     Date of Onset  1    R27.8     Other lack of coordination                       12/12/2014  Demographics:            Age: 49Y            Gender: Male    Rehabilitation Precautions/Restrictions:   No Driving  Visual orgnaization issues    SUBJECTIVE  Patient Report: Pt with no new complaints - pts father in law passed this week  on Monday so pt appeared down  Patient/Caregiver Goals: "To be able to make a well founded decision to  returning to work and figuring out how to make that decision.  To understand why  or why not I should return to work."  Pain: Patient currently without complaints of pain.    OBJECTIVE  General Observation: pt presents to PT, arrived sitting in big gym  Other/Additional Findings: n/a    Interventions:       Neuromuscular Reeducation:  Performed standing balance activities  including: rockerboard, rockerboard with VOR x 1 viewing, rockerboard with  alternating cone taps and single leg stance with opposite leg holding on cone,  performed tandem stance on foam, rhomberg on foam with eyes closed x 10 sec x 4  reps.  Performed Dynamic balance training encorporating FGA components: gait training  wih R and L head turns and up and down head turns x 100' x 6 reps, walking at  various speeds and 180 degree turns.  Pain Reassessment: Pain was not reassessed as no pain was reported.  Education:    Education Provided: Plan of care.       Audience: Patient.       Mode: Explanation.  Demonstration.       Response: Verbalized understanding.    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  Recommend ongoing activities that challenge  vestibular  system.    Activity/Participation Problem List and Goals: No updates at this time.  Functions/Structures Problem List and Goals: No updates at this time.    PLAN  Treatment Frequency, Duration, and Interventions: Continue Physical Therapy to  achieve goals per previously established Plan of Care.  Development of Plan of Care: There was no change to plan of care today. Patient  and/or family continue to be in agreement with plan of care.  The patient has been instructed to contact our clinic if any questions or  problems should arise.    Visit Number: Today's visit is number  13    Signed by: Darrold Span, PT 01/23/2015 4:00:00 PM

## 2015-01-24 NOTE — Rehab Progress Note ([facility]) (Signed)
Blake [name]  MRN: [id number]  Account: [id number]  Session Start: 01/24/2015 2:00:00 PM  Session Stop: 01/24/2015 3:00:00 PM    Physical Therapy  Outpatient Treatment Note    Medical Diagnosis:  R PCA CVA  Therapy Diagnosis:  Rank Code      Description     Date of Onset  1    R27.8     Other lack of coordination                       12/12/2014  Demographics:            Age: 61Y            Gender: Male    Rehabilitation Precautions/Restrictions:   No Driving  Visual orgnaization issues    SUBJECTIVE  Patient Report: "I feel out of sorts being at two houses."  Patient/Caregiver Goals: "To be able to make a well founded decision to  returning to work and figuring out how to make that decision.  To understand why  or why not I should return to work."  Pain: Patient currently without complaints of pain.    OBJECTIVE  General Observation: pt presents to PT, arrived sitting in big gym  Other/Additional Findings: n/a    Interventions:       Therapeutic Activities:  Upright bike at L 4 x 12 minutes, various  strengthening activities to assist with low back and IT band tightness as well  as general balance  including:  bridging with add squeeze on ball, bridging with alternating leg extension,  lower abdominal crunch, dead bug, TA activation, TA activation with alternating  leg taps (x 20 reps of each )  pt provided with HEP for abdominal exercises to continue with at home    Pain Reassessment: Pain was not reassessed as no pain was reported.  Education:    Education Provided: Plan of care.       Audience: Patient.       Mode: Explanation.  Demonstration.       Response: Verbalized understanding.    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 Compulsively  looking for his wallet and keys.  Tolerated bike at higher resistance today with  less subjective reports of fatigue and improved core activation.    Activity/Participation Problem List and Goals: No updates at this  time.  Functions/Structures Problem List and Goals: No updates at this time.    PLAN  Treatment Frequency, Duration, and Interventions: Continue Physical Therapy to  achieve goals per previously established Plan of Care.  Development of Plan of Care: There was no change to plan of care today. Patient  and/or family continue to be in agreement with plan of care.  The patient has been instructed to contact our clinic if any questions or  problems should arise.    Visit Number: Today's visit is number  14    Signed by: Darrold Span, PT 01/24/2015 3:00:00 PM

## 2015-01-24 NOTE — Rehab Progress Note ([facility]) (Signed)
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 Ellen Henri    Demographics:            Age: 73Y            Gender: Male    Rehabilitation Precautions/Restrictions:   No Driving  Visual orgnaization issues    SUBJECTIVE  Patient Report: "It has been a rough week"  Patient/Caregiver Goals: "I want to get back to being my normal self. Get back  to my old routine"  "I would like to see better and to drive again"  Pain: Patient currently without complaints of pain.    OBJECTIVE  General Observation: Pt received on time, motivated to engage in treatment  session.    Interventions:       Therapeutic Activities:  Internet search completed answering 11/20  questions. Pt required max cues to scan entire computer screen and inhibit  impulsive decision making when answering questions. Pt displayed difficulty when  alternating his visual attention from L side of body on table top to computer  screen as he would frequently loose his train of thought and visual focal point.      Pain Reassessment: Pain was not reassessed as no pain was reported.  Education:    Education Provided: Plan of care.       Audience: Patient.       Mode: Explanation.       Response: Applied knowledge.  Verbalized understanding.    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 Pt will continue to benefit from ongoing OP OT 2x/week to further  maximize his functional independence,  so he can return to work related and  leisure based occupational tasks.    Activity/Participation Problem List and Goals: No updates at this time.  Functions/Structures Problem List and Goals: No updates at this time.    PLAN  Treatment Frequency, Duration, and Interventions: Continue Occupational Therapy  to achieve goals per previously established Plan of Care.  Development of Plan of Care: There was no change to plan of care today. Patient  and/or family continue to be in agreement with plan of care.  The patient has been instructed to contact our clinic if any questions or  problems should arise.    Visit Number: Today's visit is number  13    Signed by: Willy Eddy, OTR/L 01/24/2015 4:00:00 PM

## 2015-01-24 NOTE — Rehab Evaluation ([facility]) (Signed)
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 Ellen Henri  Referring Service/Team:  Medicine  Rehabilitation Precautions/Restrictions:   No driving. Visual Disturbances    SUBJECTIVE  Patient Report: "This could be good just for keeping everything together." - Pt.  answering question of whether or not he would like a binder for therapy  materials.  Patient/Caregiver Goals: To return to work in some capacity  Pain: Patient currently without complaints of pain.    OBJECTIVE  General Observation: Pt. arrived on time to session from OT session. Pt.  pleasant and participatory. Pt. also notified clinicians he lsot his  father-in-law this past weekend. Pt emotionally stressed but did not appear to  significantly affect attention or cognitive state during this session.  Supervising therapist present in the room, guiding and directing treatment and  plan of care.    Interventions:       Speech Treatment: HEP: Pt. did not have time to complete homework from last  session due to personal/family matters.    Visual organization / strategies: Pt. worked with clinicians to develop and  organize therapy binder and sort materials according to each therapy section.  Pt. completed this task with supervision level assist.    Problem solving / alternating attention: Pt. worked on 2 moderately complex  problem solving activities (incorporating visual scanning, visual problem  solving, simple math), alternating attention between the 2 tasks as instructed  by clinician (incorporating some prospective memory  cues). Pt. completed one  task with min assist and finished approx50% of second task with 100% accuracy. Pt.  utilized strategies (visual saliency, note taking, paying attention to most  important information) with supervision level to min assist.    HEP: Pt. to finish task not completed today, time-permitting.    Pain Reassessment: Pain was not reassessed as no pain was reported.  Education:    Education Provided: Problem solving and attention strategies .       Audience: Patient.       Mode: Explanation.  Demonstration.       Response: Verbalized understanding.  Demonstrated skill.  Needs practice.  Needs reinforcement.    ASSESSMENT  Pt. continues to be motivated and receptive to education. Pt. with progress  utilizing strategies i'ly, but continues to benefit from verbal cues.    Activity/Participation Problem List and Goals: No updates at this time.  Functions/Structures Problem List and Goals: No updates at this time.    PLAN  Treatment Frequency, Duration, and Interventions: Continue Speech/Language  Pathology to achieve goals per previously established Plan of Care.  Development of Plan of Care: There was no change to plan of care today. Patient  and/or family continue to be in agreement with plan of care.  The patient has been instructed to contact our clinic if any questions or  problems should arise.    Visit Number: Today's visit is number  13  Signed by: Daron Offer, SLP Student 01/23/2015 3:00:00 PM     - CoSigned By: Ranee Gosselin, M.S., CCC-SLP 01/24/2015 1:28:57 PM

## 2015-01-25 NOTE — Rehab Evaluation ([facility]) (Signed)
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 Ellen Henri  Referring Service/Team:  Medicine  Rehabilitation Precautions/Restrictions:   No driving. Visual Disturbances    SUBJECTIVE  Patient Report: "Evidently I'm not in the habit of doing this naturally, but it  seems worthwhile." - Pt. referring to not typically utilizing written strategies  during problem solving tasks.  Patient/Caregiver Goals: To return to work in some capacity  Pain: Patient currently without complaints of pain.    OBJECTIVE  General Observation: Pt. arrived on time to session and was pleasant and  participatory. Supervising therapist present in the room, guiding and directing  treatment and plan of care.    Interventions:       Speech Treatment: Problem solving: Pt. completed complex  problem solving task (involving attention, deductive reasoning, and  planning/scheduling). Pt. required min-mod assist (verbal cues) to utilize  written strategies (note taking, chart-making, visual organization of  information) for problem solving efficiency and working memory compensation to  arrive at correct solution to problem.    Pain Reassessment: Pain was not reassessed as no pain was reported.  Education:    Education Provided: Memory and problem solving strategies .       Audience: Patient.       Mode: Explanation.  Demonstration.       Response: Verbalized understanding.  Demonstrated skill.  Needs practice.  Needs reinforcement.    ASSESSMENT  Pt. continues to display difficulties in the area of mental flexibility  during  problem-solving tasks (difficulties increasing with complexity of task) and  continues to benefit from verbal cueing for strategy use. Pt. remains receptive  to education.    Activity/Participation Problem List and Goals: No updates at this time.  Functions/Structures Problem List and Goals: No updates at this time.    PLAN  Treatment Frequency, Duration, and Interventions: Continue Speech/Language  Pathology to achieve goals per previously established Plan of Care.  Development of Plan of Care: There was no change to plan of care today. Patient  and/or family continue to be in agreement with plan of care.  The patient has been instructed to contact our clinic if any questions or  problems should arise.    Visit Number: Today's visit is number  14    Signed by: Daron Offer, SLP Student 01/24/2015 2:00:00 PM     - CoSigned By: Ranee Gosselin, M.S., CCC-SLP 01/25/2015 9:27:22 AM

## 2015-01-30 NOTE — Rehab Progress Note ([facility]) (Signed)
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 Ellen Henri    Demographics:            Age: 22Y            Gender: Male    Rehabilitation Precautions/Restrictions:   No Driving  Visual orgnaization issues    SUBJECTIVE  Patient Report: "I am sorry I was late, we were watching our grandkids"  Patient/Caregiver Goals: "I want to get back to being my normal self. Get back  to my old routine"  "I would like to see better and to drive again"  Pain: Patient currently without complaints of pain.    OBJECTIVE  General Observation: Pt received on time, motivated to engage in treatment  session.    Interventions:       Therapeutic Activities:  Using www.eyecanlearn.com, pt engaged in visual  perceptual skills targeting figure ground and visual discrimination skills  needed for reading signs with community navigation skills. Pt required increased  time to complete with 50% verbal cues to assist in successful completion of  activity.  Pain Reassessment: Pain was not reassessed as no pain was reported.  Education:    Education Provided: Plan of care.       Audience: Patient.       Mode: Explanation.       Response: Applied knowledge.  Verbalized understanding.    ASSESSMENT  Pt's visual and cognitive deficits continue to impact his safety and  independence with topographical orientation skills specificly related to  community navigation skills. Pt is making steady gains session to session and  will continue to benefit from further focus on visual perceptual and visual  cognitive skills in  prep to maximize his functional independence with navigation  tasks. Continue with ongoing OT 2x/week.    Activity/Participation Problem List and Goals: No updates at this time.  Functions/Structures Problem List and Goals: No updates at this time.    PLAN  Treatment Frequency, Duration, and Interventions: Continue Occupational Therapy  to achieve goals per previously established Plan of Care.  Development of Plan of Care: There was no change to plan of care today. Patient  and/or family continue to be in agreement with plan of care.  The patient has been instructed to contact our clinic if any questions or  problems should arise.    Visit Number: Today's visit is number  14    Signed by: Willy Eddy, OTR/L 01/30/2015 12:00:00 PM

## 2015-01-30 NOTE — Rehab Progress Note ([facility]) (Signed)
Blake [name]  MRN: [id number]  Account: [id number]  Session Start: 01/30/2015 2:00:00 PM  Session Stop: 01/30/2015 3:00:00 PM    Physical Therapy  Outpatient Treatment Note    Medical Diagnosis:  R PCA CVA  Therapy Diagnosis:  Rank Code      Description     Date of Onset  1    R27.8     Other lack of coordination                       12/12/2014  Demographics:            Age: 23Y            Gender: Male    Rehabilitation Precautions/Restrictions:   No Driving  Visual orgnaization issues    SUBJECTIVE  Patient Report: "I am having this weird visual thing where everything is dim all  the time."  Patient/Caregiver Goals: "To be able to make a well founded decision to  returning to work and figuring out how to make that decision.  To understand why  or why not I should return to work."  Pain: Patient currently without complaints of pain.    OBJECTIVE  General Observation: pt presents to PT, arrived sitting in big gym  Other/Additional Findings: n/a    Interventions:       Therapeutic Activities:  Performed gait training with R and L head turns  and up and down head turns coordinating with every 1-3 steps x 6 reps of 100',  PNF chopping with 4 lb medicine ball in normal stance and split stance.  Negotiation over and around obstacles as well as ascending/descending ramp and  curb with no difficulty at an I level.  Pain Reassessment: Pain was not reassessed as no pain was reported.  Education:    Education Provided: Plan of care. Home exercise/activity plan.       Audience: Patient.       Mode: Explanation.  Demonstration.  Teacher, English as a foreign language provided.       Response: Demonstrated skill.    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  Provided updated HEP.    Activity/Participation Problem List and Goals: No updates at this time.  Functions/Structures Problem List and Goals: No updates at  this time.    PLAN  Treatment Frequency, Duration, and Interventions: Continue Physical Therapy to  achieve goals per previously established Plan of Care.  Development of Plan of Care: There was no change to plan of care today. Patient  and/or family continue to be in agreement with plan of care.  The patient has been instructed to contact our clinic if any questions or  problems should arise.    Visit Number: Today's visit is number  15    Signed by: Darrold Span, PT 01/30/2015 3:00:00 PM

## 2015-01-30 NOTE — Progress Notes (Signed)
Blake [name]  MRN: [id number]  Account: [id number]  Session Start: 01/30/2015 12:00:00 PM  Session Stop: 01/30/2015 1:00:00 PM    Outpatient Social Work Note    Medical Diagnosis:  Stroke  Demographics:            Age: 54Y            Gender: Male    Discussed the large life decision of whether to retire or not and move to [place].  Patient feels overwhelmed with the loss of his mother, his stroke, the loss of  his father in law and making large life decisions. The family is pushing them to  move in a matter of months but patient was able to begin to look at this move as  a possible option over a more extended period of time. Money was also discussed  and LTD. SW will assist patient in exploring his options through this transition  as well as helping them fill out the additional forms.    Signed by: Delbert Phenix, LGSW 01/30/2015 1:00:00 PM

## 2015-01-31 NOTE — Rehab Evaluation ([facility]) (Signed)
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 Ellen Henri  Referring Service/Team:  Medicine  Rehabilitation Precautions/Restrictions:   No driving. Visual Disturbances    SUBJECTIVE  Patient Report: "But how can I know if I am being the most efficient?" - Pt.  during discussion of utilizing strategies for efficiency of problem solving.  Patient/Caregiver Goals: To return to work in some capacity  Pain: Patient currently without complaints of pain.    OBJECTIVE  General Observation: Pt. arrived on time to session and was pleasant and  participatory. Supervising therapist present in the room, guiding and directing  treatment and plan of care.    Interventions:       Speech Treatment: HEP: Pt. completed alternating attention written task  with 100% accuracy. Pt. expressed some perseveration on ambiguities in the task,  and clinician and Pt. discussed how to make decisions on ambiguities and remain  consistent in task,    Problem solving: Pt. worked on moderately-complex problem solving task  (involving deductive reasoning and alternating attention). Pt. required mod to  max assist (verbal cues and written models) to utilize written strategies (note  taking, visual organization of information, crossing out completed items) for  problem solving efficiency and working memory compensation. Pt. unable to fully  complete task this session, but task will be re-attempted next session.  Pain Reassessment: Pain was not reassessed as no pain was reported.  Education:    Education Provided: Problem  solving strategies. .       Audience: Patient.       Mode: Explanation.  Demonstration.       Response: Verbalized understanding.  Demonstrated skill.  Needs practice.  Needs reinforcement.    ASSESSMENT  Pt. with difficulty with deductive reasoning task today. Performance complicated  by superficial insight into problem-solving deficits as well as current  emotional state. Pt. continues to benefit from visual cues as well as models,  and remains receptive to education.    Activity/Participation Problem List and Goals: No updates at this time.  Functions/Structures Problem List and Goals: No updates at this time.    PLAN  Treatment Frequency, Duration, and Interventions: Continue Speech/Language  Pathology to achieve goals per previously established Plan of Care.  Development of Plan of Care: There was no change to plan of care today. Patient  and/or family continue to be in agreement with plan of care.  The patient has been instructed to contact our clinic if any questions or  problems should arise.    Visit Number: Today's visit is number  15    Signed by: Daron Offer, SLP Student 01/30/2015 4:00:00 PM     - CoSigned By: Ranee Gosselin, M.S., CCC-SLP 01/31/2015 10:26:31 AM

## 2015-02-01 NOTE — Rehab Progress Note ([facility]) (Signed)
NAMENOLE ROBEY  MRN: [id number]  Account: [id number]  Session Start: 02/01/2015 3:00:00 PM  Session Stop: 02/01/2015 4:00:00 PM    Physical Therapy  Outpatient Treatment Note    Medical Diagnosis:  R PCA CVA  Therapy Diagnosis:  Rank Code      Description     Date of Onset  1    R27.8     Other lack of coordination                       12/12/2014  Demographics:            Age: 86Y            Gender: Male    Rehabilitation Precautions/Restrictions:   No Driving  Visual orgnaization issues    SUBJECTIVE  Patient Report: "I am not sure what I want to do with my life at this point.  Retire in [place] or keep working."  Patient/Caregiver Goals: "To be able to make a well founded decision to  returning to work and figuring out how to make that decision.  To understand why  or why not I should return to work."  Pain: Patient currently without complaints of pain.    OBJECTIVE  General Observation: pt presents to PT, arrived sitting in big gym  Other/Additional Findings: n/a    Interventions:       Therapeutic Activities:  Performed upright bike at L 5 x 10 minutes for  warm up. Discussed at length life modification including diet and exercise.  Performed scanning tasking using edging techniques to find various items and  locations without the hospital, pt needed min cueing at times.  Pain Reassessment: Pain was not reassessed as no pain was reported.  Education:    Education Provided: Plan of care.       Audience: Patient.       Mode: Explanation.  Demonstration.       Response: Verbalized understanding.    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    Activity/Participation Problem List and Goals: No updates at this time.  Functions/Structures Problem List and Goals: No updates at this time.    PLAN  Treatment Frequency, Duration, and Interventions: Continue Physical Therapy to  achieve goals per previously  established Plan of Care.  Development of Plan of Care: There was no change to plan of care today. Patient  and/or family continue to be in agreement with plan of care.  The patient has been instructed to contact our clinic if any questions or  problems should arise.    Visit Number: Today's visit is number  16    Signed by: Darrold Span, PT 02/01/2015 4:00:00 PM

## 2015-02-01 NOTE — Rehab Progress Note ([facility]) (Signed)
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 Ellen Henri    Demographics:            Age: 30Y            Gender: Male    Rehabilitation Precautions/Restrictions:   No Driving  Visual organization issues    SUBJECTIVE  Patient Report: "I am ready to get back to being organized in my own  environment."  Patient/Caregiver Goals: "I want to get back to being my normal self. Get back  to my old routine"  "I would like to see better and to drive again"  Pain: Patient currently without complaints of pain.    OBJECTIVE  General Observation: Pt. was received on time and was motivated and engaged  throughout entire OT session. Supervising therapist was present in the room,  guiding and directing treatment and plan of care.    Interventions:       Therapeutic Activities:  Pt. engaged in visual perceptual activities  targeting visual organization and visual scanning skills needed for computer  based tasks at work. Pt required min assist for visual organization. Pt. also  utilizing compensatory strategies to locate designated words within a cluttered  environment.  Pain Reassessment: Pain was not reassessed as no pain was reported.  Education:    Education Provided: Plan of care.       Audience: Patient.       Mode: Explanation.       Response: Verbalized understanding.    ASSESSMENT  Pt. has made significant progress in visual perceptual skills needed for online  reading. However, pt. would benefit from OT 2x/week to improve visual scanning  and figure-ground deficits that continue to  impact his safety and independence  in the home.    Activity/Participation Problem List and Goals: No updates at this time.  Functions/Structures Problem List and Goals: No updates at this time.    PLAN  Treatment Frequency, Duration, and Interventions: Continue Occupational Therapy  to achieve goals per previously established Plan of Care.  Development of Plan of Care: There was no change to plan of care today. Patient  and/or family continue to be in agreement with plan of care.  The patient has been instructed to contact our clinic if any questions or  problems should arise.    Visit Number: Today's visit is number  15    Signed by: Joellyn Rued, OT Student 02/01/2015 3:00:00 PM     - CoSigned By: Willy Eddy, OTR/L 02/01/2015 3:42:38 PM

## 2015-02-04 NOTE — Rehab Evaluation ([facility]) (Signed)
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 Ellen Henri  Referring Service/Team:  Medicine  Rehabilitation Precautions/Restrictions:   No driving. Visual Disturbances    SUBJECTIVE  Patient Report: "My own ability to perceive my mental operations clearly has  changed." - Pt. referring to cognitive deficits since stroke.  Patient/Caregiver Goals: To return to work in some capacity  Pain: Patient currently without complaints of pain.    OBJECTIVE  General Observation: Pt. arrived on time to session and was pleasant and  participatory. Supervising therapist present in the room, guiding and directing  treatment and plan of care.    Interventions:       Speech Treatment: Problem solving: Pt. and clinician discussed problem  solving strategies and task started last session. Pt. re-started problem-solving  / deductive reasoning written task started last session and was able to  accurately complete task with min assist and extra time. Pt. and clinician  discussed strategies again after task completion, and Pt. verbalized increased  understanding.  Pain Reassessment: Pain was not reassessed as no pain was reported.  Education:    Education Provided: Problem solving strategies .       Audience: Patient.       Mode: Explanation.  Demonstration.       Response: Verbalized understanding.  Demonstrated skill.  Needs practice.  Needs reinforcement.    ASSESSMENT  Pt. with improved understanding and utilization of problem solving strategies  today. Pt. remains receptive to education, and continues to  benefit from verbal  cues during problem solving tasks.    Activity/Participation Problem List and Goals: No updates at this time.  Functions/Structures Problem List and Goals: No updates at this time.    PLAN  Treatment Frequency, Duration, and Interventions: Continue Speech/Language  Pathology to achieve goals per previously established Plan of Care.  Development of Plan of Care: There was no change to plan of care today. Patient  and/or family continue to be in agreement with plan of care.  The patient has been instructed to contact our clinic if any questions or  problems should arise.    Visit Number: Today's visit is number  16    Signed by: Daron Offer, SLP Student 02/01/2015 2:00:00 PM     - CoSigned By: Ranee Gosselin, M.S., CCC-SLP 02/04/2015 9:51:18 AM

## 2015-02-04 NOTE — Rehab Progress Note ([facility]) (Signed)
NAMEVIKASH NEST  MRN: [id number]  Account: [id number]  Session Start: 02/04/2015 3:00:00 PM  Session Stop: 02/04/2015 4:00:00 PM    Physical Therapy  Outpatient Treatment Note    Medical Diagnosis:  R PCA CVA  Therapy Diagnosis:  Rank Code      Description     Date of Onset  1    R27.8     Other lack of coordination                       12/12/2014  Demographics:            Age: 50Y            Gender: Male    Rehabilitation Precautions/Restrictions:   No Driving  Visual orgnaization issues    SUBJECTIVE  Patient Report: No new complaints, pt reports he was less active this weekend  Patient/Caregiver Goals: "To be able to make a well founded decision to  returning to work and figuring out how to make that decision.  To understand why  or why not I should return to work."  Pain: Patient currently without complaints of pain.    OBJECTIVE  General Observation: pt presents to PT, arrived sitting in big gym  Other/Additional Findings: n/a    Interventions:       Neuromuscular Reeducation:  Performed standing balance activities  including: standing on foam wedge toes up, PNF chopping with visual tracking x  10 B, trunk rotation with VOR cancellation on foam wedge.  VOR x 1 viewing while  on foam x 3 reps of 30 sec, performed once on solid ground.  Performed Dynamic balance training encorporating FGA components: gait training  wih R and L head turns and up and down head turns x 100' x 6 reps, tandem gait,  retro tandem gait progressing to ball toss with tandem gait.  Braiding  progressing to dual tasking with ball toss.  Pain Reassessment: Pain was not reassessed as no pain was reported.  Education:    Education Provided: Plan of care. Precautions. Home exercise/activity plan.       Audience: Patient.       Mode: Explanation.  Demonstration.       Response: Verbalized understanding.    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  Pt  has not been completing HEP at home and was  instructed on importance of  performing at home for carry over at clinic and in everyday life.  Anticipate pt  will be ready for d/c next week.    Activity/Participation Problem List and Goals: No updates at this time.  Functions/Structures Problem List and Goals: No updates at this time.    PLAN  Treatment Frequency, Duration, and Interventions: Continue Physical Therapy to  achieve goals per previously established Plan of Care.  Development of Plan of Care: There was no change to plan of care today. Patient  and/or family continue to be in agreement with plan of care.  The patient has been instructed to contact our clinic if any questions or  problems should arise.    Visit Number: Today's visit is number  17    Signed by: Darrold Span, PT 02/04/2015 4:00:00 PM

## 2015-02-04 NOTE — Rehab Progress Note ([facility]) (Signed)
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 Ellen Henri    Demographics:            Age: 74Y            Gender: Male    Rehabilitation Precautions/Restrictions:   No Driving  Visual organization issues    SUBJECTIVE  Patient Report: "For some reason over the weekend, I had some difficulty with  recognizing familiar patterns."  Patient/Caregiver Goals: "I want to get back to being my normal self. Get back  to my old routine"  "I would like to see better and to drive again"  Pain: Patient currently without complaints of pain.    OBJECTIVE  General Observation: Pt. was received on time and was motivated and engaged  throughout entire session. Supervising therapist was present in the room,  guiding and directing treatment and plan of care.    Interventions:       Therapeutic Activities:  Initiated OT session with visual perceptual and  abstract reasoning activities performed in a distracting environment and within  various lighting in prep for navigating through a crowded street. Pt.  demonstrating ability to complete activity with no verbal/visual cues. However,  required extended time to complete graded task.  Pain Reassessment: Pain was not reassessed as no pain was reported.  Education:    Education Provided: Plan of care.       Audience: Patient.       Mode: Explanation.       Response: Verbalized understanding.    ASSESSMENT  Despite progress, pt. would continue to benefit from OT 2x/week to address  visual perceptual and executive functioning deficits that continue to  impact his  safety and independence in community mobility.    Activity/Participation Problem List and Goals: No updates at this time.  Functions/Structures Problem List and Goals: No updates at this time.    PLAN  Treatment Frequency, Duration, and Interventions: Continue Occupational Therapy  to achieve goals per previously established Plan of Care.  Development of Plan of Care: There was no change to plan of care today. Patient  and/or family continue to be in agreement with plan of care.  The patient has been instructed to contact our clinic if any questions or  problems should arise.    Visit Number: Today's visit is number  16    Signed by: Joellyn Rued, OT Student 02/04/2015 3:00:00 PM     - CoSigned By: Willy Eddy, OTR/L 02/04/2015 4:12:04 PM

## 2015-02-05 ENCOUNTER — Ambulatory Visit: Payer: BLUE CROSS/BLUE SHIELD | Attending: Hospice and Palliative Medicine

## 2015-02-05 ENCOUNTER — Ambulatory Visit: Payer: BLUE CROSS/BLUE SHIELD

## 2015-02-05 DIAGNOSIS — I6931 Cognitive deficits following cerebral infarction: Secondary | ICD-10-CM | POA: Insufficient documentation

## 2015-02-05 DIAGNOSIS — R279 Unspecified lack of coordination: Secondary | ICD-10-CM | POA: Insufficient documentation

## 2015-02-05 DIAGNOSIS — H539 Unspecified visual disturbance: Secondary | ICD-10-CM | POA: Insufficient documentation

## 2015-02-05 DIAGNOSIS — H53452 Other localized visual field defect, left eye: Secondary | ICD-10-CM | POA: Insufficient documentation

## 2015-02-05 NOTE — Rehab Evaluation ([facility]) (Signed)
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 Ellen Henri  Referring Service/Team:  Medicine  Rehabilitation Precautions/Restrictions:   No driving. Visual Disturbances    SUBJECTIVE  Patient Report: "Counting things is not as easy." - Pt. referring to difficulty  with processing visual information as easily as before. Pt. also again mentioned  "my ability to consciously motitor my cognitive operations" being affected.  Patient/Caregiver Goals: To return to work in some capacity  Pain: Patient currently without complaints of pain.    OBJECTIVE  General Observation: Pt. arrived on time to session and was pleasant and  participatory. Supervising therapist present in the room, guiding and directing  treatment and plan of care.    Interventions:       Speech Treatment: Problem solving: Pt. and clinician discussed visual  problem solving strategies including looknig for patterns, verbalizing visual  problem solving, and double checking using edging strategies. Pt. employed these  strategies with min assist (verbal cues) during 2 trials of pattern generation  activity using Parquetry Blocks. Pt. was able to verbalize increased  understanding of strategies by end of session.  Pain Reassessment: Pain was not reassessed as no pain was reported.  Education:    Education Provided: Problem solving strategies .       Audience: Patient.       Mode: Explanation.  Demonstration.       Response: Verbalized understanding.  Demonstrated skill.  Needs practice.  Needs reinforcement.    ASSESSMENT  Pt.  continues to express more awareness of cognitive deficits, as well as  emerging awareness specifically of difficulties with self-monitoring cognitive  processes. Pt. continues to be receptive to education.    Activity/Participation Problem List and Goals: No updates at this time.  Functions/Structures Problem List and Goals: No updates at this time.    PLAN  Treatment Frequency, Duration, and Interventions: Continue Speech/Language  Pathology to achieve goals per previously established Plan of Care.  Development of Plan of Care: There was no change to plan of care today. Patient  and/or family continue to be in agreement with plan of care.  The patient has been instructed to contact our clinic if any questions or  problems should arise.    Visit Number: Today's visit is number  17    Signed by: Daron Offer, SLP Student 02/04/2015 12:00:00 PM     - CoSigned By: Ranee Gosselin, M.S., CCC-SLP 02/05/2015 8:12:51 AM

## 2015-02-06 NOTE — Progress Notes (Signed)
Blake [name]  MRN: [id number]  Account: [id number]  Session Start: 02/04/2015 1:00:00 PM  Session Stop: 02/04/2015 2:00:00 PM    Outpatient Social Work Note    Medical Diagnosis:  Stroke  Demographics:            Age: 60Y            Gender: Male    Worked through the process of LTD and started filling out the form. SW assisted  in breaking down the form and helping them see it was doable. SW then worked  with patient on his current living situation and the many choices which are  ahead. Patient was able to express his coping skills for his anger and how they  benefited him.    Signed by: Delbert Phenix, LGSW 02/04/2015 2:00:00 PM

## 2015-02-06 NOTE — Rehab Progress Note ([facility]) (Signed)
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 Ellen Henri    Demographics:            Age: 41Y            Gender: Male    Rehabilitation Precautions/Restrictions:   No Driving  Visual organization issues    SUBJECTIVE  Patient Report: "It doesn't make much of a difference." (in reference to  activity)  Patient/Caregiver Goals: "I want to get back to being my normal self. Get back  to my old routine"  "I would like to see better and to drive again"  Pain: Patient currently without complaints of pain.    OBJECTIVE  General Observation: Pt. was received on time and was engaged throughout entire  OT session. Supervising therapist was present in the room, guiding and directing  treatment and plan of care.    Interventions:       Self Care/Home Management:  Pt. engaged in medication management task  focusing on visual figure ground and form constancy skills. Pt. demonstrating  difficulty with visually recognizing similar shapes grouped together within a  small surface area secondary to L visual field cut. Pt. educated on adaptive  strategy to efficiently recognize and determine correct dosage of similarly  shaped medication tablets at home.  OT utilized strategy of having the patient  incoporate his LUE, bringing his L visual field into midline, by pouring  "medication" into his R hand to adapt to visual field cut. Pt presenting with  increased success, by going slow, presenting objects 1 at a time by using this  strategy, allow for his visual processing to catch up with cognitive  processing  thoughts.  Pain Reassessment: Pain was not reassessed as no pain was reported.  Education:    Education Provided: Plan of care.       Audience: Patient.       Mode: Explanation.       Response: Verbalized understanding.    ASSESSMENT  Despite significant gains, pt. would benefit from OT 2x/week to further address  visual perceptual deficits that continue to impact his safety and independence  with medication management.    Activity/Participation Problem List and Goals: No updates at this time.  Functions/Structures Problem List and Goals: No updates at this time.    PLAN  Treatment Frequency, Duration, and Interventions: Continue Occupational Therapy  to achieve goals per previously established Plan of Care.  Development of Plan of Care: There was no change to plan of care today. Patient  and/or family continue to be in agreement with plan of care.  The patient has been instructed to contact our clinic if any questions or  problems should arise.    Visit Number: Today's visit is number  44    Signed by: Joellyn Rued,  OT Student 02/06/2015 3:00:00 PM     - CoSigned By: Willy Eddy, OTR/L 02/06/2015 3:48:31 PM

## 2015-02-06 NOTE — Rehab Progress Note ([facility]) (Signed)
Blake [name]  MRN: [id number]  Account: [id number]  Session Start: 02/06/2015 1:00:00 PM  Session Stop: 02/06/2015 2:00:00 PM    Physical Therapy  Outpatient Treatment Note    Medical Diagnosis:  R PCA CVA  Therapy Diagnosis:  Rank Code      Description     Date of Onset  1    R27.8     Other lack of coordination                       12/12/2014  Demographics:            Age: 1Y            Gender: Male    Rehabilitation Precautions/Restrictions:   No Driving  Visual orgnaization issues    SUBJECTIVE  Patient Report: no new complaints  Patient/Caregiver Goals: "To be able to make a well founded decision to  returning to work and figuring out how to make that decision.  To understand why  or why not I should return to work."  Pain: Patient currently without complaints of pain.    OBJECTIVE  General Observation: pt presents to PT, arrived sitting in big gym  Other/Additional Findings: n/a    Interventions:       Neuromuscular Reeducation:  Performed standing balance activities  including: standing on foam and rockerboard performing toe taps,  multidirectional head turns, VOR x 1 viewing, negotiation over and around  obstacles with consecutive cone taps.  Navigation tasks attempting to engaging  edging techniques to locate two items within the hospital  Pain Reassessment: Pain was not reassessed as no pain was reported.  Education:    Education Provided: Precautions. Plan of care.       Audience: Patient.       Mode: Explanation.       Response: Verbalized understanding.    ASSESSMENT  Pt remains internally distracted which impedes his ability to self navigate even  familiar settings    Activity/Participation Problem List and Goals: No updates at this time.  Functions/Structures Problem List and Goals: No updates at this time.    PLAN  Treatment Frequency, Duration, and Interventions: Continue Physical Therapy to  achieve goals per previously established Plan of Care.  Development of Plan of Care: There was no change  to plan of care today. Patient  and/or family continue to be in agreement with plan of care.  The patient has been instructed to contact our clinic if any questions or  problems should arise.    Visit Number: Today's visit is number  18    Signed by: Darrold Span, PT 02/06/2015 2:00:00 PM

## 2015-02-07 NOTE — Rehab Evaluation ([facility]) (Signed)
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 Ellen Henri  Referring Service/Team:  Medicine  Rehabilitation Precautions/Restrictions:   No driving. Visual Disturbances    SUBJECTIVE  Patient Report: "I had a traumatic experience with emailing." - Pt. referring to  difficulty with emailing about a week ago. Possible difficulty with email may be  making Pt. somewhat hesitant to utilize computer/internet in session.  Patient/Caregiver Goals: To return to work in some capacity  Pain: Patient currently without complaints of pain.    OBJECTIVE  General Observation: Pt. arrived on time to session from OT and was pleasant and  participatory. Supervising therapist present in the room, guiding and directing  treatment and plan of care.    Interventions:       Speech Treatment: Visual and verbal problem solving: Pt. and clinician  again discussed visual problem solving strategies including looknig for  patterns, verbalizing visual problem solving, and double checking using edging  strategies. Pt. employed these strategies with min assist (verbal cues) during 1  trial of pattern/structure generation activity using pipe tree, also  incorporating alternating attention.    Problem solving / internet navigation: Pt. began internet navigation activity,  requiring min-mod assist to begin developing approach to task (which item to  start with, how to search). Pt. again demonstrated some perseveration on common  ambiguities instructions, and required min-mod assist (verbal cues) to make  appropriate and  timely interpretations of instructions.  Pain Reassessment: Pain was not reassessed as no pain was reported.  Education:    Education Provided: Problem solving strategies .       Audience: Patient.       Mode: Explanation.  Demonstration.       Response: Verbalized understanding.  Demonstrated skill.  Needs practice.  Needs reinforcement.    ASSESSMENT  Pt. with some progress in utilization of verbal problem solving strategies for  visual tasks this session. Pt. still requiring assist to make consistent and  appropriate interpretations of perceived ambiguities in written/verbal  instructions. Pt. remains receptive to education.    Activity/Participation Problem List and Goals: No updates at this time.  Functions/Structures Problem List and Goals: No updates at this time.    PLAN  Treatment Frequency, Duration, and Interventions: Continue Speech/Language  Pathology to achieve goals per previously established Plan of Care.  Development of Plan of Care: There was no change to plan of care today. Patient  and/or family continue to be in agreement with plan of care.  The patient has been instructed to contact our clinic if any questions or  problems should arise.    Visit Number: Today's visit is number  73    Signed by: Daron Offer, SLP Student 02/06/2015 4:00:00 PM     - CoSigned By: Ranee Gosselin, M.S., CCC-SLP 02/07/2015 2:27:19 PM

## 2015-02-08 NOTE — Rehab Progress Note ([facility]) (Signed)
NAMEGOKU HARB  MRN: [id number]  Account: [id number]  Session Start: 11/27/2014 12:00:00 AM  Session Stop: 11/27/2014 12:00:00 AM    Case Management  Inpatient Rehabilitation Team Conference    Conference Date/Time: 11/27/2014 2:00:00 PM    Demographics            Age: 56Y            Gender: Male    Admission Date: 11/19/2014 6:50:00 PM  Diagnosis: CVA (Right Brain)  Comorbidities:    VITAL SIGNS  Blood Pressure: 125/67 mmHg  Temperature:  degrees  Pulse: 55 beats per minute  Respirations: 18 breaths per minute  Pain: 4/10    WEIGHT and NUTRITION  Admission Weight: 238 pounds; Current Weight: 237.6pounds  Weight Change since Admit: Patient has lost 0.4 pounds since admission.  Food Consistency: Regular  Liquid Consistency:Thin    Plan Of Care  Team Members Attending : Dr. Ellen Henri MD, Mariel Sleet CM, Malachy Mood PT,  Britteny Brenenour OT, Ladell Heads SLP, Maurice Small RN  Anticipated Discharge Date/Estimated Length of Stay: 12/22  Anticipated Discharge Destination: Community discharge with assistance  Discharge Plan : home  Medical Necessity Expected Level Rationale: daily MD eandm for VS, Neuro eval.....  Intensity and Duration: an average of 3 hours/5 days per week  Medical Supervision and 24 Hour Rehab Nursing: x  Physical Therapy: x  PT Intensity/Duration: 60-166min/day5-6days/wk  Occupational Therapy: x  OT Intensity/Duration: 60-125min/day5-6days/wk  Speech and Language Therapy: x  SLP Intensity/Duration: 60-139min/day5-6days/wk  Therapeutic Recreation: x  Psychology: x  Registered Dietician: x  Other: evals in progress    The following is a list of patient problems that have been identified by the  interdisciplinary team:    Problem: Impaired Bladder Management  Bladder Management Status Update: Pt is continent of bladder.    Problem: Impaired Bowel Management  Bowel Management Status Update: Pt is continent of bowel.    Problem: Impaired Cognition  Cognition Status Update: no-min cues to use L neglect  strategies  Team Identified Barrier to Discharge: No  Interventions:  Decrease environmental stimuli: Active  Safety awareness training: Active  Compensatory strategies: Active  Cognition: Primary Team Goal/Status: Patient will use strategies to compensate  for lL neglect in simple reading/writing/info management with no cues 75% of the  time. / Met    Problem: Impaired Communication  Communication Status Update: mod cues to convey mildly-complex info  Team Identified Barrier to Discharge: No  Interventions:  Compensatory strategies: Active  Communication device: Active  Use simple brief communication techniques: Active  Communication: Primary Team Goal/Status: Pt will monitor for clarity, relevancy  and organization to convey mildy complex information (e.g. give directions to  his home) with occasional reminder (< 2X in 10 minute conversation) / Not Met    Problem: Impaired Leisure Skills  Team Identified Barrier to Discharge: No  Interventions:  Provide leisure activity alternatives within functional limitations: Active  Identify community resources: Active  Other Leisure Intervention 1 - Text: Activities emphasizing L attention, safety  awareness, mobility- balance and coordination  Other Leisure Intervention 1 - Status: Active  Leisure Skills: Primary Team Goal/Status: Pt will demonstrate appropriate safety  awareness, mobility, and cognition in order to participate in a leisure activity  of interest at mod I level at discharge / Met    Problem: Impaired Mobility  Mobility Status Update: Independent bed mobility, transfers, gait up to 1000, 20  stairs  Team Identified Barrier to Discharge: No  Interventions:  Transfer training: Discontinue  Gait training: Discontinue  Wheelchair propulsion and management: Discontinue  Mobility: Primary Team Goal/Status: Patient will ambulate household distances  (50') independently and ascend/descend 12 steps with 1 HR with modified  independence to facilitate safe return to  home environment. / Met    Problem: Impaired Pain Management  Pain Management Status Update: No voiced c/o pain.    Problem: Impaired Psychosocial Skills/Behavior  PsychoSocial/Behavior Status Update: pt. w/ improving adjustment and awareness  Team Identified Barrier to Discharge: No  Interventions:  Supportive counseling: Active  Coping skills training: Active  Encourage verbalization of feelings: Active  Other Pyschosocial/Behavior Intervention 1 - Text: ed  Other Psychosocial/Behavior Intervention 1 - Status: Active  PsychoSocial: Primary Team Goal/Status: Pt. will improve adjustment to medical  condition and rehab process to function at the min A level / Active  PsychoSocial: Additional Team Goal/Status:  Pt. and family will process ed re  CVA, including any psych/adjustment implications and return to work issues as  evidenced by verbalization  /    Problem: Impaired Self-care Mgmt/ADL/IADL  Self Care/ADL/IADL Status Update: Met goals  Team Identified Barrier to Discharge: No  Interventions:  Adaptive equipment training: Discontinue  Compensatory strategies: Discontinue  Encourage patient to participate in activities of daily living: Discontinue  Self Care: Primary Team Goal/Status: Pt will complete morning routine with  supervision provided by wife. / Met    Problem: Safety Risk and Restraint  Safety Risk Status Update: Pt has left visual field disburbance and is at risk  for fallls. Instructed to call prior to getting OOB.  Team Identified Barrier to Discharge: No  Safety: Primary Team Goal/Status: self awareness of effected side in order to  prevent neglect /    Fall Risk Assessment: TOTAL SCORE is <=10:  Low Risk.    Functional Measures  Eating: 7  Grooming: 6  Bathing: 5  Upper Body Dressing: 6  Lower Body Dressing: 6  Toileting: 6  Bladder Level of Assistance: 7  Bowel Level of Assistance: 6  Bed/Chair/Wheelchair Transfer: 7  Toilet Transfer: 6  Tub Transfer:  Engineer, structural: 5  Wheelchair: 5  Walk:  7  Stairs: 7  Comprehension: 7  Expression: 6  Social Interaction: 6  Problem Solving: 4  Memory: 6  Bladder Frequency of Accidents - Admission Period: IRF Score = 6 - No accidents;  uses device  Bowel Frequency of Accidents - Admission Period: IRF Score = 6 - No accidents;  uses device    KEY TO FIM LEVELS (General)  7 The patient completes the task or skill independently in a timely manner.  6 The patient completes the task or skill without a helper but uses an assistive  device, has mild difficulty or needs extra time.  5 The patient performs 100% of the task with supervision.  4 The patient performs 75-90% of the effort to complete the task or skill.  3 The patient performs 50-74% of the effort to complete the task or skill.  2 The patient performs 25-49% of the effort to complete the task or skill.  1 The patient performs 0-24% of the effort to complete the task or skill.  0 The task or skill does not occur because it is unsafe, contraindicated or the  patient refused.    Walk/Wheelchair Functional Measures scoring parameters:  7 Patient walks/propels a minimum of 150 feet independently.  6 Patient walks/propels a minimum of 150 feet without a helper but with a device  or safety concerns.  5 Patient walks/propels  50 feet independently OR walks a minimum of 150 feet  with supervision.  4 Patient performs 75-90% of the effort to walk/propel a minimum of 150 feet.  3 Patient performs 50-74% of the effort to walk/propel a minimum of 150 feet.  2 Patient walks/propels a minimum of 50 feet with any amount of assistance from  a helper.  1 Patient walks/propels less than 50 feet with any amount of assistance or needs  two helpers for any distance.    Stair Functional Measures scoring parameters:  7 Patient goes up and down 12 to 14 stairs independently.  6 Patient goes up and down 12 to 14 stairs with a device but without a helper.  5 Patient goes up and down 4 to 6 stairs independently OR 12 to 14 stairs  with  supervision.  4 Patient performs 75-90% of the effort to go up and down 12 to 14 stairs.  3 Patient performs 50-74% of the effort to go up and down 12 to 14 stairs.  2 Patient goes up and down 4 to 6 stairs with any amount of assistance from a  helper.  1 Patient goes up and down less than 4 to 6 stairs, needs two helpers for any  number of stairs or uses a stair lift.    Comments:    Signed by: Mariel Sleet, RN, BSN, CCM, ACM 11/27/2014 5:11:00 PM    Physician CoSigned By: Candie Echevaria 02/08/2015 10:54:54

## 2015-02-12 NOTE — Rehab Progress Note ([facility]) (Signed)
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 Ellen Henri    Demographics:            Age: 63Y            Gender: Male    Rehabilitation Precautions/Restrictions:   No Driving  Visual orgnaization issues    SUBJECTIVE  Patient Report: "There is so many stresses in my life right now. I am really  good at hiding that I am ok"  Patient/Caregiver Goals: "I want to get back to being my normal self. Get back  to my old routine"  "I would like to see better and to drive again"  Pain: Patient currently without complaints of pain.    OBJECTIVE  General Observation: Pt received on time, motivated to engage in treatment  session.    Interventions:       Therapeutic Activities:  At the beginning of treatment, pt voiced coping  needs secondary to current psychosocial stressors in his life. OT recommending  outlets to allow pt to focus on stress free leisure skills. Pt and OT reviewed  visual field test, demonstrating significant dense L visual field cut in LUQ and  LLQ. At the end of session, pt engaged in visual discrimination task in a high  stimulated environment requiring 25% cues to successfully complete.  Continue  with ongoing OP OT 2x/week  Pain Reassessment: Pain was not reassessed as no pain was reported.  Education:    Education Provided: Plan of care.       Audience: Patient.       Mode: Explanation.       Response: Applied knowledge.  Verbalized understanding.    ASSESSMENT  Low vision specialist recommended to patient- printed hand out provided. Pt  continues to be impacted by  visual perceptual deficits and executive functional  deficits, which continue to impact his functional performance with managing his  daily routine.    Activity/Participation Problem List and Goals: No updates at this time.  Functions/Structures Problem List and Goals: No updates at this time.    PLAN  Treatment Frequency, Duration, and Interventions: Continue Occupational Therapy  to achieve goals per previously established Plan of Care.  Development of Plan of Care: There was no change to plan of care today. Patient  and/or family continue to be in agreement with plan of care.  The patient has been instructed to contact our clinic if any questions or  problems should arise.    Visit Number: Today's visit is number  18    Signed by: Willy Eddy, OTR/L 02/12/2015 2:00:00 PM

## 2015-02-12 NOTE — Discharge Summary (Signed)
[name]  MRN: [id number]  Account: [id number]  Session Start: 02/12/2015 11:00:00 AM  Session Stop: 02/12/2015 12:00:00 PM    Physical Therapy  Outpatient Discharge Summary    Medical Diagnosis:  R PCA CVA  Therapy Diagnosis:  Rank Code      Description     Date of Onset  1    R27.8     Other lack of coordination                       12/12/2014  Demographics:            Age: 34Y            Gender: Male    Rehabilitation Precautions/Restrictions:   No Driving  Visual orgnaization issues    Initial Evaluation Date: 12/11/2014 4:00:00 PM  Reporting Period: 2/11-3/8  Visit Number: Today's visit is number  19    SUBJECTIVE  Patient Report: "I wen to see the eye doctor so I thought it was so  interesting."  Patient/Caregiver Goals: "To be able to make a well founded decision to  returning to work and figuring out how to make that decision.  To understand why  or why not I should return to work."  Pain: Patient currently without complaints of pain.    OBJECTIVE  General Observation: pt presents to PT, arrived sitting in big gym  Special Tests: FGA: see below  Functional Mobility:            Bed Mobility: All bed mobility including supine to sit, rolling,  scooting. with independence.            Transfers: Patient transferred bed to/from chair with independence.            Locomotion/Wheelchair: Not assessed.            Locomotion/Gait: Patient was independent with gait/ambulation for pt  needs cueing at times for scanning techniques and to attend to his surrounding  with poor visual organization and field cut he tends to miss information and  walk in circles frequently getting lost.  Today he did not need cueing and use  appropriate paths to find items and locations in hospital . No assistive devices  were required.            Stairs: Patient was modified independent for 16 . Patient used the  following equipment: Unilateral Railing.  Interventions:       Therapeutic Activities:  Discussed ongoing HEP at home  including daily  aerobic training to include a walking program.  FGA performed.  Performed  scanning tasking using edging techniques to find various items and locations  without the hospital, pt found three locations without assistance.  Functional Gait Assessment:       GAIT LEVEL SURFACE: Normal - walks 13ft less than 5.5 sec, no assistive  device, good speed, no evidence of imbalance, normal gait, deviates no more than  6in outside 12in walkway width       CHANGE IN GAIT SPEED: Normal - able to smoothly change walking speed w/o  loss of balance or gait deviation. Significant difference in walking speeds  normal/fast/slow. Deviates no more than 6in outside 12in [id number]       GAIT WITH HORIZONTAL HEAD TURNS: Mild impairment - performs head turns  smoothly with slight change in gait velocity, deviate 6-10in outside 12in  walkway, or uses assistive device       GAIT WITH VERTICAL HEAD TURNS:  Mild impairment - performs task with slight  change in gait velocity, deviates 6-10in outside 12in walkway, or uses assistive  device       GAIT AND PIVOT TURN: Normal - pivot turns safely w/in 3 sec and stops  quickly with no loss of balance.       STEP OVER OBSTACLE: Normal - is able to step over 2 stacked shoe boxes  taped together w/o changing gait speed; no evidence of imbalance       GAIT WITH NARROW BASE OF SUPPORT: Normal - is able to ambulate for 10  steps, heel/toe with no staggering       GAIT WITH EYES CLOSED: Mild impairment - walks 54ft, uses assistive device,  slower speed, mild gait deviations, deviates 6-10in outside 12in walkway       AMBULATING BACKWARDS: Normal - walks 3ft, no assistive device, good speed,  no evidence for imbalance, normal gait pattern, deviate no more than 6in outside  12in walkway       STEPS: Normal - alternating feet, no rail  TOTAL SCORE: 27 /30    Pain Reassessment: Pain was not reassessed as no pain was reported.  Education:    Education Provided: Plan of care. Home exercise/activity  plan.       Audience: Patient.       Mode: Explanation.  Demonstration.       Response: Applied knowledge.  Verbalized understanding.    ASSESSMENT  Clinical Status Changes: Patient has not experienced significant, unusual or  unexpected change in clinical status since their last visit.    Activity/Participation Problem List and Goals: Mobility: Walking and Moving  Around  Goal: LTG( 20 visits from 1/5):  Pt will score WNL on the SOT to appropriate use  sensory systems for balance - MET    STG( 10 visits from 1/5):  Pt will score WNL on the FGA to reduce fall risk  -  NOT MET  LTG ( 20 visits from 1/5): Pt will access community setting including navigating  over uneven ground, curbs, ramps at an I level -MET    STG ( 10 visits from 1/5):  Pt will complete a navigation task within the  hospital with minimal cueing only  - MET    LTG ( 20 visits from 1/5):  Pt will complete a naviation task within the  familiar setting of the hospital with S from therapist -MET  Goal Status: Resolved  Status: Pt has met all goals except for FGA goal which pt remains at a 27/30-  with difficulty on head turns during walking and walking with eyes closed  Functions/Structures Problem List and Goals:  Vestibular: STG ( 10 visits from 1/5):  Pt will complete MCTSIB without  excessive postural sway - MET    LTG (20 visits from 1/5):  Pt will perform VOR x 1 viewing for 1 minute without  signs of symptoms of dizziness or evidence of retinal slip - MET  Goal Status: Resolved  Status: pt has met both goals    Progress Summary: 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  He has made great progress and when he is attending to task as hand he  can navigate safely in community settings, he is also aware of his field cuts  and able to compensate for them at times.  Recommend ongoing treatment with OT  and SLP but at this time pt has met his  PT goals and no further treatment is  recommended  Equipment Provided: None issued this visit.  Recommendations: Physical Therapy services are discontinued at this time  secondary to: Goals have been MET. No need for skilled therapy intervention at  this time. Patient is independent (able to return demonstrate) home exercise  program.    Signed by: Darrold Span, PT 02/12/2015 12:00:00 PM

## 2015-02-13 NOTE — Rehab Evaluation ([facility]) (Signed)
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 Ellen Henri  Referring Service/Team:  Medicine  Rehabilitation Precautions/Restrictions:   No driving. Visual Disturbances    SUBJECTIVE  Patient Report: "I experience more anxiety than I used to." - Pt. report during  general discussion of things that are different since Pt.'s stroke.  Patient/Caregiver Goals: To return to work in some capacity  Pain: Patient currently without complaints of pain.    OBJECTIVE  General Observation: Pt. arrived on time from previous session and was pleasant  and participatory, however, somewhat more anxious than usual. Per OT, Pt. having  difficult day emotionally. Supervising therapist present in the room, guiding  and directing treatment and plan of care.    Interventions:       Speech Treatment: Visual and verbal problem solving: Pt. verbalized probelm  solving to find and get to location of outdoor pavillion while simultaneously  navigating.    Prospective memory: Pt. given 3 prospective memory tasks to complete while  reading aloud from a complex novel. Pt. effectively executed 1 in full, recalled  time cue but not activity for 1, and did not recall 1 (with time cue) at all  when asked. Performance demonstrates Pt.'s limited temporal awareness as well as  self-monitoring. Pt. required min-mod verbal cues from clinician to recall  portions of tasks not initially remembered.  Pain Reassessment: Pain was not reassessed as no pain was reported.  Education:    Education Provided: Memory and problem solving .        Audience: Patient.       Mode: Explanation.  Demonstration.       Response: Verbalized understanding.  Demonstrated skill.  Needs practice.  Needs reinforcement.    ASSESSMENT  Pt. still requiring min-mod assist for prospective memory tasks and displays  limited temporal awareness and self-monitoring. Pt. remains receptive to  education.    Activity/Participation Problem List and Goals: No updates at this time.  Functions/Structures Problem List and Goals: No updates at this time.    PLAN  Treatment Frequency, Duration, and Interventions: Continue Speech/Language  Pathology to achieve goals per previously established Plan of Care.  Development of Plan of Care: There was no change to plan of care today. Patient  and/or family continue to be in agreement with plan of care.  The patient has been instructed to contact our clinic if any questions or  problems should arise.    Visit Number: Today's visit is number  76    Signed by: Daron Offer, SLP Student 02/12/2015 4:00:00 PM     - CoSigned By: Ranee Gosselin, M.S., CCC-SLP 02/13/2015 8:45:22 AM

## 2015-02-13 NOTE — Progress Notes (Signed)
NAMEGIOMAR [name]  MRN: [id number]  Account: [id number]  Session Start: 02/12/2015 2:00:00 PM  Session Stop: 02/12/2015 3:00:00 PM    Outpatient Social Work Note    Medical Diagnosis:  Stroke  Demographics:            Age: 61Y            Gender: Male    Patient was in distress because of his financial situation, and his health. SW  and patient worked together with the wife in looking at his options and  remembering he does have control in his choices. Patient feels his family is  telling him what to do and feels guilt around disagreeing because their  arguments are logical. SW and patient worked on having space to grieve and take  steps into making the right decisions for him and his family.    Signed by: Delbert Phenix, LGSW 02/12/2015 3:00:00 PM

## 2015-02-14 NOTE — Rehab Progress Note ([facility]) (Signed)
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 Ellen Henri    Demographics:            Age: 69Y            Gender: Male    Rehabilitation Precautions/Restrictions:   No Driving  Visual orgnaization issues    SUBJECTIVE  Patient Report: "I am doing fine, just sat on my porch this morning and watched  the world go by"  Patient/Caregiver Goals: "I want to get back to being my normal self. Get back  to my old routine"  "I would like to see better and to drive again"  Pain: Patient currently without complaints of pain.    OBJECTIVE  General Observation: Pt received on time, motivated to engage in session.    Interventions:       Therapeutic Activities:  Pt engaged in complex internet search activity in  a high stimulated environment focusing on working memory, visual attention, and  visual scanning needs. Pt demonstrating improvement with self adaptation of head  eye movement patterns as he was able to scan L to R without difficulty. However,  pt demonstrating impairment with working and [employer], requiring over 50% cues  throughout. At the end of session OT discussed POC and progress towards recovery  thus far.  Pain Reassessment: Pain was not reassessed as no pain was reported.  Education:    Education Provided: Plan of care.       Audience: Patient.       Mode: Explanation.       Response: Applied knowledge.  Verbalized understanding.    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 Pt  will benefit from continued OP OT 2x/week to further address his remaining  deficits.Low vision specialist continues to be recommended to assist the pt with  visual perceptual/photosensitivity impairments.    Activity/Participation Problem List and Goals: No updates at this time.  Functions/Structures Problem List and Goals: No updates at this time.    PLAN  Treatment Frequency, Duration, and Interventions: Continue Occupational Therapy  to achieve goals per previously established Plan of Care.  Development of Plan of Care: There was no change to plan of care today. Patient  and/or family continue to be in agreement with plan of care.  The patient has been instructed to contact our clinic if any questions or  problems should arise.    Visit Number: Today's visit is number  43    Signed by: Willy Eddy, OTR/L 02/14/2015 3:00:00 PM

## 2015-02-15 NOTE — Rehab Evaluation ([facility]) (Signed)
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 Visual Disturbances    Patient Report: "I suppose I could make this assumption." - Pt. while  verbalizing problem-solving process while dealing with normal ambiguities in  written task, demonstrating gains in mental flexibility.  Patient/Caregiver Goals: To return to work in some capacity  Pain: Patient currently without complaints of pain.  General Observation: Pt. arrived on time from previous session and was pleasant  and participatory. Supervising therapist present in the room, guiding and  directing treatment and plan of care.  Interventions:       Speech Treatment: Problem solving: Pt. worked on complex problem solving  written task, utilizing strategies (e.g., self-talk, note-taking,  double-checking) and demonstrating appropriate mental flexibility with min  assist (verbal cues) and extra time.  Pain Reassessment: Pain was not reassessed as no pain was reported.  Education:    Education Provided: Problem solving strategies .       Audience: Patient.       Mode: Explanation.  Demonstration.       Response: Verbalized understanding.  Demonstrated skill.  Needs practice.  Needs reinforcement.    Initial Evaluation Date: 12/11/2014 12:00:00 PM  Reporting Period: 01/10/15 - 02/14/15  Number of Visits to Date: Today's visit is number  20    Activity/Participation Problem List and Goals: Attention  Goal: LTG (20 visits from 01/10/15) Pt will utilize  edging strategies to  compensate for neglect in complex reading, writing and information management  (e.g. filling out pt report) with no cues 90% of the time. - GOAL MET    STG (10 visits from 01/10/15) Pt. will utilize edging strategies to compensate for  neglect in moderately complex information management (e.g., filling out Pt.  report, informational chart/diagram use and completion) with no cues 90% of the  time. - GOAL MET  .  Status: Pt. with steady progress this reporting period, meeting STG and LTG for  attention this reporting period. Treatment to continue to target attention  indirectly through problem solving tasks.  Functions/Structures Problem List and Goals:  Problem Solving: LTG (20 visits from 01/10/15) Pt. will demonstrate appropriate  mental flexibility and utilize learned strategies to compensate for cognitive  deficits for effective management of complex home, personal, community and work  responsibilities at supervision to functional independence assist level. - GOAL  IN PROGRESS    STG (10 visits from 01/10/15) Pt will demonstrate appropriate mental flexibility  and utilize learned strategies to compensate for cognitive deficits for  effective management of complex tasks given min assist and extra time. - GOAL  MET    Updated Goals:    LTG (10 visits from 02/14/15) Pt. will demonstrate appropriate mental flexibility  and utilize learned strategies to compensate for cognitive deficits for  effective management of complex home, personal, community and work  responsibilities at supervision to functional independence assist level.  .  Status: Pt. with steady progress this reporting period,  meeting STG for problem  solving. Treatment now to focus on LTG.    Progress Summary: Pt. with steady progress this reporting period, meeting 1 LTG,  and 2/2 STGs. Pt.?s progress with utilization of strategies to compensate for  left neglect has extended from reading and writing activities to tasks involving  visually  complex information management (e.g., charts, diagrams) with Pt.  independently employing these strategies of edging at least 90% of the time. Pt.  also with progress in area of problem solving, meeting problem solving STG this  reporting period. At this point, Pt. is utilizing learned strategies  (note-taking, self-talk) to complete complex tasks given extra time and min  assist from clinician (mainly verbal cues) to utilize strategies. Treatment now  to focus on LTG through continuing to decrease assist needed for strategy  utilization and particularly targeting mental flexibility, which remains a  barrier in problem solving tasks. Pt. continues to require and benefit from  verbal cues to focus on relevant information and not to perseverate on  extraneous or seemingly ambiguous information. Treatment this reporting period  also to focus increasingly on Pt.'s self-awareness of mental flexibility  deficits and understanding of appropriate strategies. In addition, prospective  memory tasks will continue to be incorporated throughout sessions and during  problem solving tasks. Overall, Pt. continues to benefit from speech-language  therapy in order to meet goals stated above and to return to previous  roles/responsibilities.  Equipment Provided: None issued this visit.  Rehabilitation Potential:  Patient?s condition has potential to improve.  Patient is improving in response to therapy.  Maximum improvement is yet to be attained.  Necessity: Patient requires speech language therapy plan in order to return to  Premorbid environment (or reside in new living environment).  Patient requires outpatient therapy in order to reduce Activities of Daily  Living or Instrumental Activities of Daily Living assistance to a premorbid  level.  Patient requires speech language therapy plan in order to return to work.  Recommended Consults:  None currently.  Recommendations: Speech/Language Pathology services recommended for 2x/week  for  10 visits; groups as indicated Speech/Language Pathology treatment is to  include: Treat speech, language, voice, communication, and/or auditory  processing disorder. Group treat speech, language, voice, communication, and/or  auditory processing disorder.    Physician Certification: This is to certify that the above named patient, who is  under my care, requires skilled Speech/Language Pathology services as described  in the above treatment plan.  I further certify that the services outlined in  this plan are skilled and medically necessary.  I have reviewed this plan for  rehabilitation services, and I recommend that these services continue for 10  visits  to meet the goals stated above.    Physician signature: __________________ MD,  Date of certification:  ____/____/____    Signed by: Daron Offer, SLP Student 02/14/2015 4:00:00 PM     - CoSigned By: Ranee Gosselin, M.S., CCC-SLP 02/15/2015 1:34:12 PM

## 2015-02-19 NOTE — Discharge Summary (Signed)
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      Visual disturbances                              12/11/2014  2    H53.452   Other localized visual field defect, left eye    12/11/2014  Demographics:            Age: 56Y            Gender: Male    Rehabilitation Precautions/Restrictions:   No Driving  Visual organization impairments    Initial Evaluation Date: 12/11/2014 3:00:00 PM  Reporting Period: 01/14/15 to 02/19/15  Visit Number: Today's visit is number  20      Hooper's Visual Organization Test: 28/30    12/11/14(eval) Hooper's Visual Organization Test:22/30      SUBJECTIVE  Patient Report: "I think it is best to save my visits incase I need to see a low  vision therapist"  Patient/Caregiver Goals: "I want to get back to being my normal self. Get back  to my old routine"  "I would like to see better and to drive again"  Pain: Patient currently without complaints of pain.    OBJECTIVE  General Observation: Pt received on time for therapy session, motivated to  engage in treatment.      Interventions:       Therapeutic Activities:  Discharge Summary completed- pt educated on  progress, goal achievement, and further recommendations. Please see below for  further information.  Pain Reassessment: Pain was not reassessed as no pain was reported.  Education:    Education Provided: Plan of care. Home exercise/activity plan.       Audience: Patient.       Mode: Explanation.  Demonstration.  Teacher, English as a foreign language provided.       Response: Applied knowledge.  Verbalized understanding.    ASSESSMENT  Clinical Status Changes: Patient has not experienced significant, unusual or  unexpected change in clinical status since their last  visit.    Activity/Participation Problem List and Goals:  Self Care  Goal: LTG(20 visits from 01/14/15): Pt will demonstrate the ability to prepare a  basic hot meal at an independent level in order to resume kitchen based tasks.  GOAL MET BY [name]    LTG(20 visits from 01/14/15): Pt will be able to navigate 5 areas in the hospital  using auditory and visual directions at a Mod I level in order to be able to  safely navigate to familiar areas of [facility] for work related needs. GOAL MET BY [name]      .  Goal Status:: Resolved  Status: Pt achieved all LTGs. Pt has demonstrated self adaptive strategies  maximizing his self performance with ADL and AIDL needs. However, pt's cognitive  and emtional regulation continues to greatly impact his functional independence,  which will require further services from SLP and SW.    .  Functions/Structures Problem List and Goals:  Vision: LTG(20 visits from 01/14/15): Pt will hit 60 targets in 60 seconds on BITs  targeting all visual quandrants in order  to increase visual awareness with  locating moving objects/people when walking. GOAL MET BY [name]    LTG(20 visits from 01/14/15): Pt will improve score on Hooper's Visual  Organization Test by 5 points or greater in order to demonstrate the ability to  accurately identify and visually organize objects within his environment without  cues. GOAL MET BY [name]    .  Goal Status: Resolved  Status: Pt MET all vision goals. OT is recommending follow up with a low vision  specialist in order to further address photosensitivty needs and it is now  outside the scope of the OTR. Pt ahs been given recommendation for Low Vision  Specialist.    Progress Summary: Blake [name] has made excellent progress in OP OT since his  evaluation in Jan. 2016. Blake [name] is now able to navigate his environment,  usuing adaptive visual stratgies, read at a level that is of comfort and no  strain, and sustain visual and cognitive attention enough to participate in  therapy when  placed in a distracting environment. However, despite these gains,  Blake [name]' cognitive processing and emotional recovery continues to impact  his functional independence and safety. Hence, Blake [name] will continue to be  followed by Speech and Social Work services as well as follow up with a low  vision specialist to further address his remaining deficits. Blake [name] is not  eligeable to resume driving at this time, due to his significant left visual  field cut (temporally) as well as remaining congitive impairments. Blake [name]  is aware of this recommendation and continues to be compliant with off the road  safety recommendations. 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    Equipment Provided: None issued this visit.  Recommendations: The patient has been discharged from Occupational Therapy  services secondary to: Goals have been MET. Patient is independent (able to  return demonstrate) home exercise program. Pt with low vision deficits which  continue to impact his daily life- OT recommending patient follow up with Low  Vision Specialist in order to further assess visual changes that continue to be  present- pt in agreement and is in the process of making an appointment.    Signed by: Willy Eddy, OTR/L 02/19/2015 2:00:00 PM

## 2015-02-20 NOTE — Progress Notes (Signed)
NAMERAWLEY HARJU  MRN: [id number]  Account: [id number]  Session Start: 02/19/2015 2:00:00 PM  Session Stop: 02/19/2015 3:00:00 PM    Outpatient Social Work Note    Medical Diagnosis:  Stroke  Demographics:            Age: 14Y            Gender: Male    Met with patient and his wife and processed his feelings on graduating from OT.  Patient had mixed emotions, but saw it as a positive  move. 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 Patient was  able to verbalize for the first time how he saw himself in retirement and  picture himself moving to [place].    Signed by: Delbert Phenix, LGSW 02/19/2015 3:00:00 PM

## 2015-02-20 NOTE — Rehab Evaluation ([facility]) (Signed)
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 Ellen Henri  Referring Service/Team:  Medicine  Rehabilitation Precautions/Restrictions:   No driving. Visual Disturbances    SUBJECTIVE  Patient Report: "I couldn't process for like several minutes after that... This  is one example where it seems I would not be ready to go back to work." - Pt.  report RE: events when leaving session last Thursday. Pt. reports  confusion/distraction associated with navigating through busy hospital lobby.  Pt. and clinician discussed strategies to consider for future similar situations  (e.g., allowing more time, stepping aside to 'regroup'".  Patient/Caregiver Goals: To return to work in some capacity  Pain: Patient currently without complaints of pain.    OBJECTIVE  General Observation: Pt. arrived approx15 minutes late and was pleasant and  participatory. Supervising therapist present in the room, guiding and directing  treatment and plan of care.    Interventions:       Speech Treatment: Problem solving: Pt. worked on complex problem solving  written task Counselling psychologist. Throughout task, Pt. required mod  assist to demonstrate appropriate mental flexibility in question interpretation,  as well as to develop and verbalize plan for how to obtain target information  for each question. Task to be contniued in subsequent session.  Pain Reassessment: Pain was not reassessed as no pain was reported.  Education:    Education Provided: Problem solving strategies .       Audience: Patient.        Mode: Explanation.  Demonstration.       Response: Verbalized understanding.  Demonstrated skill.  Needs practice.  Needs reinforcement.    ASSESSMENT  Pt. continues to benefit from verbal cues for both problem solving and mental  flexibility. Pt. still with somewhat superficial insight into cognitive deficits  (especially during specific tasks and activities requiring self-monitoring). Pt  able to make more appropriate observations after the fact. Decreased processing  speed, attn/self-monitoring deficits likely impacting insight during tasks.    Activity/Participation Problem List and Goals: No updates at this time.  Functions/Structures Problem List and Goals: No updates at this time.    PLAN  Treatment Frequency, Duration, and Interventions: Continue Speech/Language  Pathology to achieve goals per previously established Plan of Care.  Development of Plan of Care: There was no change to plan of care today. Patient  and/or family continue to be in agreement with plan of care.  The patient has been instructed to contact our clinic if any questions or  problems should arise.    Visit Number: Today's visit is number  21    Signed by: Daron Offer, SLP Student 02/19/2015 12:00:00 PM     - CoSigned By: Ranee Gosselin, M.S., CCC-SLP 02/20/2015 8:29:03 AM

## 2015-02-22 NOTE — Rehab Evaluation ([facility]) (Signed)
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 Ellen [name]  Referring Service/Team:  Medicine  Rehabilitation Precautions/Restrictions:   No driving. Visual Disturbances    SUBJECTIVE  Patient Report: "I just realized I have my wife's phone." - Pt. inititally  reporting confusion as to why certain applications were missing from phone but  then received phone call during session and realized he had taken his wife's  phone by mistake.  Patient/Caregiver Goals: To return to work in some capacity  Pain: Patient currently without complaints of pain.    OBJECTIVE  General Observation: Pt. arrived on time to session  and was pleasant and  participatory (though somewhat distracted by having wrong phone). Supervising  therapist present in the room, guiding and directing treatment and plan of care.      Interventions:       Speech Treatment: Problem solving: Pt. continued to work on complex problem  solving written task involving internet navigation. Throughout task, Pt.  required laregly min assist only (improved from last session) to demonstrate  appropriate mental flexibility in question interpretation, as well as to develop  and verbalize plan for how to obtain target information for each question. Task  to be contniued in subsequent session.  Pain Reassessment: Pain was not reassessed as no pain was reported.  Education:    Education Provided: Problem solving strategies. .       Audience: Patient.       Mode: Explanation.  Demonstration.       Response: Verbalized understanding.  Demonstrated  skill.  Needs practice.  Needs reinforcement.    ASSESSMENT  Pt. with some progress with problem solving demonstrated in internet navigation  today. Self-monitoring/awareness and mental flexibility remain areas for  improvement. Pt. remains receptive to education.    Activity/Participation Problem List and Goals: No updates at this time.  Functions/Structures Problem List and Goals: No updates at this time.    PLAN  Treatment Frequency, Duration, and Interventions: Continue Speech/Language  Pathology to achieve goals per previously established Plan of Care.  Development of Plan of Care: There was no change to plan of care today. Patient  and/or family continue to be in agreement with plan of care.  The patient has been instructed to contact our clinic if any questions or  problems should arise.    Visit Number: Today's visit is number  22    Signed by: Daron Offer, SLP Student 02/21/2015 2:00:00 PM     - CoSigned By: Ranee Gosselin, M.S., CCC-SLP 02/22/2015 8:20:48 AM

## 2015-02-27 NOTE — Rehab Evaluation ([facility]) (Signed)
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 Ellen Henri  Referring Service/Team:  Medicine  Rehabilitation Precautions/Restrictions:   No driving. Visual Disturbances    SUBJECTIVE  Patient Report: "Before this, there was work to distract me." - Pt. report  during conversation about noticing and managing anxiety. Pt. and clinician  discussed different cognitive tasks Pt. 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  Patient/Caregiver Goals: To return to work in some capacity  Pain: Patient currently without complaints of pain.    OBJECTIVE  General Observation: Pt. arrived on time to session  and was pleasant and  participatory (though somewhat distracted by anxiety over daughter coming to  town from [place] next week and her safety when traveling). Supervising therapist  present in the room, guiding and directing treatment and plan of care.    Interventions:       Speech Treatment: Problem solving / Pragmatics: Pt.and clinicians engaged  in extensive discussion on Pt.'s management of time and anxiety at home. Much of  discussion revolved around Pt. problem solving cognitive tasks to help organize  time (e.g., have a schedule/routine) and distract from worrying. By end of  conversation/session, Pt. has a list of multiple items discussed, which he can  futher research and/or implement at home (e.g., researching moving to [place],  organizing books and items in house, making a daily schedule, engaging in  physical  activity, planning/making choices for future decisions with pro/con  lists, planning travel, going to [employer], journaling or writing a book,  farming, tutoring, and making phone calls to family/friends." Pt. required min  assist (verbal cues) to take notes on important parts of conversation for recall  later, and displayed some difficulties with pragmatics (i.e., topic  maintenance), insight, and temporal awareness throughout conversation. Pt.  benefited from min-mod verbal cues to stay on topic and to keep in mind the main  objective of the conversation.    HEP: Pt. to try some of the cognitive tasks discussed today and report back to  clinicians on how they are going.  Pain Reassessment: Pain was not reassessed as no pain was reported.  Education:    Education Provided: Problem solving. Pragmatics. .       Audience: Patient.       Mode: Explanation.  Demonstration.       Response: Verbalized understanding.  Demonstrated skill.  Needs practice.  Needs reinforcement.    ASSESSMENT  Pt. with some difficulties with pragmatics (topic maintenance), insight, and  awareness today during conversation, but generally responded well to verbal  cueing. Pt. remains receptive to education.    Activity/Participation Problem List and Goals: No updates at this time.  Functions/Structures Problem List and Goals: No updates at this time.    PLAN  Treatment Frequency, Duration, and Interventions: Continue Speech/Language  Pathology  to achieve goals per previously established Plan of Care.  Development of Plan of Care: There was no change to plan of care today. Patient  and/or family continue to be in agreement with plan of care.  The patient has been instructed to contact our clinic if any questions or  problems should arise.    Visit Number: Today's visit is number  23    Signed by: Daron Offer, SLP Student 02/26/2015 4:00:00 PM     - CoSigned By: Ranee Gosselin, M.S., CCC-SLP 02/27/2015 10:35:25 AM

## 2015-03-01 NOTE — Rehab Evaluation ([facility]) (Signed)
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 Ellen Henri  Referring Service/Team:  Medicine  Rehabilitation Precautions/Restrictions:   No driving. Visual Disturbances    SUBJECTIVE  Patient Report: "Oh a list. This must be the list." - Pt. report when clinician  referred to list made last session of ideas for cognitive activities/projects to  look into starting at home. Pt. did not seem to recall this list or look at it  since last session.  Patient/Caregiver Goals: To return to work in some capacity  Pain: Patient currently without complaints of pain.    OBJECTIVE  General Observation: Pt. arrived on time to session  about 10 minutes late and  was pleasant and participatory. Supervising therapist present in the room,  guiding and directing treatment and plan of care.    Interventions:       Speech Treatment: Problem solving / Pragmatics: Pt.and clinicians continued  dissucion on Pt.'s management of time and anxiety at home, also beginning to  address how patient can problem solve goals and scheduling of certain activities  pertaining to health and exercise (e.g., walking daily, eating better). Pt.  continued to display some difficulties with pragmatics (i.e., topic  maintenance), insight, and temporal awareness throughout conversation, and  benefited again from min-mod verbal cues.    Problem solving / internet navigation: Pt. completed internet search started  several sessions ago. Pt. was able to problem solve ways to find specific  information using internet with min assist, and  also demonstrated slightly  increased mental flexibility with this task compared to in previous sessions.    HEP: Pt. to begin thinking of topics he would like to research and problem solve  in upcoming sessions.  Pain Reassessment: Pain was not reassessed as no pain was reported.  Education:    Education Provided: Problem solving. Planning/scheduling. .       Audience: Patient.       Mode: Explanation.  Demonstration.       Response: Verbalized understanding.  Demonstrated skill.  Needs practice.  Needs reinforcement.    ASSESSMENT  Pt. again with some difficulties with pragmatics (topic maintenance), insight,  and awareness today during conversation, but generally responded well to verbal  cueing. Pt. also with improvement in problem solving and mental flexibility on  internet navigation task. Pt. remains receptive to education.    Activity/Participation Problem List and Goals: No updates at this time.  Functions/Structures Problem List and Goals: No updates at this time.    PLAN  Treatment Frequency, Duration, and Interventions: Continue Speech/Language  Pathology to achieve goals per previously established Plan of Care.  Development of Plan of Care: There was no change to plan of care today. Patient  and/or family continue to be in agreement with plan of care.  The patient has been instructed to contact our clinic if any questions or  problems should arise.    Visit  Number: Today's visit is number  24    Signed by: Daron Offer, SLP Student 02/28/2015 3:00:00 PM     - CoSigned By: Ranee Gosselin, M.S., CCC-SLP 03/01/2015 9:14:10 AM

## 2015-03-04 NOTE — Progress Notes (Signed)
Blake [name]  MRN: [id number]  Account: [id number]  Session Start: 02/26/2015 2:00:00 PM  Session Stop: 02/26/2015 3:00:00 PM    Outpatient Social Work Note    Medical Diagnosis:  Stroke  Demographics:            Age: 35Y            Gender: Male    Continued the conversation about adjustment, his future, and his plans. Patient  was able to picture himself moving although still is working coming to terms  with this decision. 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 Also discussed how family dynamics interacts with the healing  process.    Signed by: Delbert Phenix, LGSW 02/26/2015 3:00:00 PM

## 2015-03-06 NOTE — Rehab Evaluation ([facility]) (Signed)
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 Ellen Henri  Referring Service/Team:  Medicine  Rehabilitation Precautions/Restrictions:   No driving. Visual Disturbances    SUBJECTIVE  Patient Report: "There's really two things: One is I can't do this, one is that  I don't really want to do this." - Pt. reporting acceptance of idea of not  planning to return to work.  Patient/Caregiver Goals: To return to work in some capacity  Pain: Patient currently without complaints of pain.    OBJECTIVE  General Observation: Pt. arrived on time to session on time late and was  pleasant and participatory. Supervising therapist present in the room, guiding  and directing treatment and plan of care.    Interventions:       Speech Treatment: Problem solving / Pragmatics: Pt.and clinicians continued  dissucion on Pt.'s management of problem solving tasks at home (e.g.,  creating/managing schedules, gathering information to make decisions. Pt.  continued to display some difficulties with pragmatics (i.e., topic  maintenance), insight, and temporal awareness throughout conversation, and  benefited again from min-mod verbal cues. Pt. also benefitted from min assist to  verbalize problem solving process during conversation.    HEP: Pt. to bring in schedule to discuss/plan discharge date with clinicians.  Pain Reassessment: Pain was not reassessed as no pain was reported.  Education:    Education Provided: Problem solving strategies. Discharge planning. .       Audience: Patient.       Mode:  Explanation.  Demonstration.       Response: Verbalized understanding.  Demonstrated skill.  Needs practice.  Needs reinforcement.    ASSESSMENT  Pt. continues to require min-mod cueing for complex functional problem solving  and pragmatics in conversation. Pt. continues to be receptive to education. Pt.  also aware of upcoming discharge plan for end or reporting period.    Activity/Participation Problem List and Goals: No updates at this time.  Functions/Structures Problem List and Goals: No updates at this time.    PLAN  Treatment Frequency, Duration, and Interventions: Continue Speech/Language  Pathology to achieve goals per previously established Plan of Care.  Development of Plan of Care: There was no change to plan of care today. Patient  and/or family continue to be in agreement with plan of care.  The patient has been instructed to contact our clinic if any questions or  problems should arise.    Visit Number: Today's visit is number  25    Signed by: Daron Offer, SLP Student 03/05/2015 4:00:00 PM     - CoSigned By: Ranee Gosselin, M.S., CCC-SLP 03/06/2015 1:14:39 PM

## 2015-03-06 NOTE — Progress Notes (Signed)
[name]  MRN: [id number]  Account: [id number]  Session Start: 03/05/2015 2:00:00 PM  Session Stop: 03/05/2015 3:00:00 PM    Outpatient Social Work Note    Medical Diagnosis:  Stroke  Demographics:            Age: 81Y            Gender: Male    Patient is on the verge of determining he is retiring, but continues to weigh  his options. Looked at all of the pieces that go into making this decision and  the long term impacts. SW provided patient with a list of long term care  attorneys who could help him plan for the future. Patient discussed his family  relationships, places where he continues to have difficulties with symptoms and  his strengths and how he is processing the experience.    Signed by: Delbert Phenix, LGSW 03/05/2015 3:00:00 PM

## 2015-03-08 ENCOUNTER — Ambulatory Visit: Payer: BLUE CROSS/BLUE SHIELD | Attending: Hospice and Palliative Medicine

## 2015-03-08 DIAGNOSIS — I6931 Cognitive deficits following cerebral infarction: Secondary | ICD-10-CM | POA: Insufficient documentation

## 2015-03-08 NOTE — Rehab Evaluation ([facility]) (Signed)
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 Ellen Henri  Referring Service/Team:  Medicine  Rehabilitation Precautions/Restrictions:   No driving. Visual Disturbances    SUBJECTIVE  Patient Report: "Pre-stroke, it wasn't particularly difficult for me to read  things upside down and backwards." - Pt. report after realizing quickly he was  looking at a worksheet upside down temporarily.  Patient/Caregiver Goals: To return to work in some capacity  Pain: Patient currently without complaints of pain.    OBJECTIVE  General Observation: Pt. arrived on time to session and was pleasant and  participatory. Supervising therapist present in the room, guiding and directing  treatment and plan of care.    Interventions:       Speech Treatment: HEP: Pt. came to session with general knowledge of  schedule for next couple weeks, but did not write anything down.    Conversation / discharge planning: Pt. and clinicians discussed Pt.'s upcoming  schedule and plan to discharge next week (see assessment section). Pt.  verbalized understanding. Pt. generally able to maintain conversation topic with  supervision level assist.    Problem solving: Pt. worked on complex problem solving written task involving  scheduling, visual organization, pre-planning, and logical reasoning. Pt.  required min assist during 2 attempts at task. Pt. with plan to finish task with  100% accuracy on one more attempt. Pt. with some progress utilizing strategies  compared to previous trials of written complex tasks.    HEP: Finish  written complex problem solving task, time permitting.  Pain Reassessment: Pain was not reassessed as no pain was reported.  Education:    Education Provided: Plan of care. Problem solving strategies. .       Audience: Patient.       Mode: Explanation.  Demonstration.       Response: Verbalized understanding.  Demonstrated skill.  Needs reinforcement.    ASSESSMENT  Pt. with some improvement in utilization of problem solving strategies today;  however, progress with problem solving this reporting period has been and  remains limited. Based on Pt.'s plan to likely not return to work, as well  deficits complicated by low vision and psychological state, and Pt.'s progress  to this point, discharge was discussed and planned for next session. Pt. was  part of plan formulation and verbalized understanding.    Activity/Participation Problem List and Goals: No updates at this time.  Functions/Structures Problem List and Goals: No updates at this time.    PLAN  Treatment Frequency, Duration, and Interventions: Speech/Language Pathology  services recommended for : d/c next visit Speech/Language Pathology treatment is  to include: Treat speech, language, voice, communication, and/or auditory  processing disorder.  Development of Plan of Care: Patient participated in and agreed to plan of care  development today. Plan for discharge next session. See assessment section  above.  The patient has been instructed to contact our clinic if any  questions or  problems should arise.    Visit Number: Today's visit is number  24    Signed by: Daron Offer, SLP Student 03/07/2015 4:00:00 PM     - CoSigned By: Ranee Gosselin, M.S., CCC-SLP 03/08/2015 8:37:27 AM

## 2015-03-13 NOTE — Progress Notes (Signed)
[name]  MRN: [id number]  Account: [id number]  Session Start: 03/12/2015 2:00:00 PM  Session Stop: 03/12/2015 3:00:00 PM    Outpatient Social Work Note    Medical Diagnosis:  Stroke  Demographics:            Age: 9Y            Gender: Male    SW met with the patient and looked at his journey to this point and long term  plans. Patient discussed his decision to retire, and move to [place] closer to his  family. SW continued to follow up about the long term care attorney and  processed the changes  in his life and what questions he still had. SW talked  with patient about the d/c from treatment and additional resources. SW will  follow up in two weeks about additional resources.    Signed by: Delbert Phenix, LGSW 03/12/2015 3:00:00 PM

## 2015-03-14 NOTE — Discharge Summary (Signed)
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 Visual Disturbances    Initial Evaluation Date: 12/11/2014 12:00:00 PM  Reporting Period: 02/14/15 - 03/12/15  Visit Number: Today's visit is number  27    SUBJECTIVE  Patient Report: "And it's not the main problem, but it causes additional  problems." - Pt. report RE: missing small details in a larger problem solving  task. Pt. and clinician discussed importance of double checking to limit chance  of missing details.  Patient/Caregiver Goals: To return to work in some capacity  Pain: Patient currently without complaints of pain.    OBJECTIVE  General Observation: Pt. arrived about 20 minutes late to session but attempted  to call therapist's office, and eventually called front office to let them know.  Supervising therapist present in the room, guiding and directing treatment and  plan of care.    Interventions:       Speech Treatment: Pt. completed complex problem solving written task  (started last session) involving scheduling, visual organization, pre-planning,  and logical reasoning. Pt. able to pick back up with task and verbalize thought  process effectively to complete task with approx 90% acc given extra time and  supervision assist, but Pt. required min assist to identify need for specific  double-checking strategy (crossing off / checking off) to increase acc to 100%.  Supervision-Min assist and extra time needed overall for this complex task. Pt.  and clinicians again discussed importance of  double-checking and compensating  for cognitive and visual deficits by utilizing this strategy.    Conversation and d/c planning: Pt. and clinicians discussed Pt.'s plans to  retire as well as ways to utilize strategies learned throughout treatment (e.g.,  memory, problem solving, attention strategies) in daily activities at home. Pt.  and clinician also had brief discussion on different methods of external memory  aid systems Pt. is looking in to trying (e.g., phone, notebook, planner).  Clinicians and Pt. discussed importance of making a routine to check/utilize  external aids, as well as importance of both cognitive and leisure activities in  daily life s/p d/c.  Pain Reassessment: Pain was not reassessed as no pain was reported.  Education:    Education Provided: Personal assistant. Memory and problem solving  strategies .       Audience: Patient.       Mode: Explanation.  Demonstration.       Response: Verbalized understanding.  Demonstrated skill.    ASSESSMENT  Clinical Status Changes: Patient has not experienced significant, unusual or  unexpected change in clinical status since their last visit.    Activity/Participation Problem List and Goals: Attention  Goal: LTG (20 visits from 01/10/15) Pt will utilize edging strategies to  compensate for neglect in complex reading, writing and information management  (e.g. filling out pt report) with no cues 90% of the time. - GOAL MET  STG (10 visits from 01/10/15) Pt. will utilize edging strategies to compensate for  neglect in moderately complex information management (e.g., filling out Pt.  report, informational chart/diagram use and completion) with no cues 90% of the  time. - GOAL MET  .  Goal Status: Resolved  Status: Pt. met goals last reporting period.  Functions/Structures Problem List and Goals:  Problem Solving: LTG (10 visits from 02/14/15) Pt. will demonstrate appropriate  mental flexibility and utilize learned strategies to compensate for  cognitive  deficits for effective management of complex home, personal, community and work  responsibilities at supervision to functional independence assist level. - GOAL  NOT MET  .  Goal Status: Unresolved  Status: Pt. generally continues to require supervision-min assist for both  structured and unstructured complex problem solving tasks    Progress Summary: Over the course of treatment, Pt. has demonstrated significant  gains overall in the areas of verbal expression, attention, and problem solving.  Pt is now able to verbally organize information in order to communicate complex  ideas/information to listeners, as well as to help him in the problem solving  process. Occasionally, pt can become verbose/tangential but is now largely able  to self-monitor and correct during conversation given extra time. Pt has also  improved with his understanding of how to utilize compensatory strategies for  visual scanning (edging) and selective/alternating/divided attn (completing one  step at a time, taking notes, self-talk, etc). From an attention standpoint, he  still benefits from use of these strategies as well as modifying/managing his  environment (reducing distractions, taking breaks, utilizing environmental  placement, etc). Pt able to verbalize these strategies but still requires  supervision-min assist to implement them consistently. Finally, pt has made  progress overall with executive function/problem solving skills. However, Pt.  with slow/limited progress in this area over this last reporting period. He  still consistently requires at least supervision-min assist mainly to utilize  self-monitoring/double-checking strategies and exercise mental flexibility for  both structured and unstructured complex problem solving tasks. Though it has  improved somewhat over the course of treatment, Pt.'s main remaining barrier  continues to be decreased insight into how strategy use can be utilized  effectively to compensate  for Pt.'s cognitive and visual deficits, leading to  inconsistency with strategy use, specifically for self-monitoring and  double-checking.    At this time, pt is able to function i'ly within his current environment,  benefiting from supervision-min assist for more complex tasks such as finances.  Pt has largely decided to retire and not return to work due to ongoing deficits  with low-vision, complicating and undermining his cognitive system during more  complex tasks.    Recommendations: Pt. is discharged from speech therapy at this time based on  Pt.'s overall progress as well as limited progress this reporting period.  Discharge based on Pt.'s functional status within his current home environment.  Pt no longer planning to return to work. Recommend continuation of HEP and  increasing involvement in personally-relevant and functional cognitive  activities (e.g., planning for upcoming events/scheduling, continuing  development of external memory aid system, finding meaningful leisure  activities) to further progress.    Signed by: Daron Offer, SLP Student 03/12/2015 2:00:00 PM     - CoSigned By: Ranee Gosselin, M.S., CCC-SLP 03/14/2015 2:03:02 PM

## 2015-03-27 NOTE — Progress Notes (Signed)
NAMEBUREL KAHRE  MRN: [id number]  Account: [id number]  Session Start: 03/27/2015 10:00:00 AM  Session Stop: 03/27/2015 11:00:00 AM    Outpatient Social Work Note    Medical Diagnosis:  Stroke  Demographics:            Age: 68Y            Gender: Male    SW met with patient and his wife and discussed the steps which needed to be  accomplished before he moved to [place]. His anger was a focus of the conversation as  well and looking at how the loss of mother, father in law, closing an estate,  family stress and closing a business needed to be dealt with as well as his fear  of having another stroke. Wife also expressed this fear and they were able to  communicate with each other and support one another. Patients were d/c at the  end of the meeting.    Signed by: Delbert Phenix, LGSW 03/27/2015 11:00:00 AM

## 2015-04-08 ENCOUNTER — Ambulatory Visit: Payer: BLUE CROSS/BLUE SHIELD | Attending: Hospice and Palliative Medicine

## 2015-04-09 ENCOUNTER — Other Ambulatory Visit (INDEPENDENT_AMBULATORY_CARE_PROVIDER_SITE_OTHER): Payer: Self-pay | Admitting: Internal Medicine

## 2015-04-09 ENCOUNTER — Telehealth (INDEPENDENT_AMBULATORY_CARE_PROVIDER_SITE_OTHER): Payer: Self-pay

## 2015-04-09 DIAGNOSIS — I639 Cerebral infarction, unspecified: Secondary | ICD-10-CM

## 2015-04-09 DIAGNOSIS — E782 Mixed hyperlipidemia: Secondary | ICD-10-CM

## 2015-04-09 NOTE — Telephone Encounter (Signed)
Pt was seen in January and was given a lab order. He never had the labs done. He would like another lab order this week.

## 2015-04-09 NOTE — Telephone Encounter (Signed)
Lab [facility]

## 2015-04-10 ENCOUNTER — Other Ambulatory Visit: Payer: BLUE CROSS/BLUE SHIELD

## 2015-04-10 ENCOUNTER — Other Ambulatory Visit (INDEPENDENT_AMBULATORY_CARE_PROVIDER_SITE_OTHER): Payer: Self-pay | Admitting: Internal Medicine

## 2015-04-11 LAB — COMPREHENSIVE METABOLIC PANEL
ALT: 49 IU/L — ABNORMAL HIGH (ref 0–44)
AST (SGOT): 28 IU/L (ref 0–40)
Albumin/Globulin Ratio: 2 (ref 1.1–2.5)
Albumin: 4.7 g/dL (ref 3.6–4.8)
Alkaline Phosphatase: 63 IU/L (ref 39–117)
BUN / Creatinine Ratio: 17 (ref 10–22)
BUN: 17 mg/dL (ref 8–27)
Bilirubin, Total: 0.4 mg/dL (ref 0.0–1.2)
CO2: 25 mmol/L (ref 18–29)
Calcium: 9.5 mg/dL (ref 8.6–10.2)
Chloride: 101 mmol/L (ref 97–108)
Creatinine: 1.01 mg/dL (ref 0.76–1.27)
EGFR: 79 mL/min/{1.73_m2} (ref >59)
EGFR: 91 mL/min/{1.73_m2} (ref >59)
Globulin, Total: 2.3 g/dL (ref 1.5–4.5)
Glucose: 103 mg/dL — ABNORMAL HIGH (ref 65–99)
Potassium: 4.3 mmol/L (ref 3.5–5.2)
Protein, Total: 7 g/dL (ref 6.0–8.5)
Sodium: 142 mmol/L (ref 134–144)

## 2015-04-11 LAB — LIPID PANEL, WITHOUT TOTAL CHOLESTEROL/HDL RATIO, SERUM
Cholesterol: 114 mg/dL (ref 100–199)
HDL: 45 mg/dL (ref >39)
LDL Calculated: 38 mg/dL (ref 0–99)
Triglycerides: 156 mg/dL — ABNORMAL HIGH (ref 0–149)
VLDL Calculated: 31 mg/dL (ref 5–40)

## 2015-04-11 LAB — CK: Creatinine Kinase, Total: 97 U/L (ref 24–204)

## 2015-04-13 LAB — HOMOCYSTEINE, SERUM: Homocysteine: 10.6 umol/L (ref 0.0–15.0)

## 2015-07-15 ENCOUNTER — Encounter (INDEPENDENT_AMBULATORY_CARE_PROVIDER_SITE_OTHER): Payer: Self-pay | Admitting: Internal Medicine

## 2015-07-15 ENCOUNTER — Ambulatory Visit (INDEPENDENT_AMBULATORY_CARE_PROVIDER_SITE_OTHER): Payer: BLUE CROSS/BLUE SHIELD | Admitting: Internal Medicine

## 2015-07-15 VITALS — BP 127/79 | HR 72 | Temp 98.3°F | Wt 226.6 lb

## 2015-07-15 DIAGNOSIS — I639 Cerebral infarction, unspecified: Secondary | ICD-10-CM

## 2015-07-15 DIAGNOSIS — I10 Essential (primary) hypertension: Secondary | ICD-10-CM

## 2015-07-15 DIAGNOSIS — Z139 Encounter for screening, unspecified: Secondary | ICD-10-CM

## 2015-07-15 DIAGNOSIS — E782 Mixed hyperlipidemia: Secondary | ICD-10-CM

## 2015-07-15 NOTE — Progress Notes (Signed)
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  HE IS PLANNING TO MOVE TO [place] LATER THIS SUMMER.  The following portions of the patient's history were reviewed and updated as appropriate: allergies, current medications, past family history, past medical history, past social history, past surgical history and problem list.    Review of Systems   Constitutional: Negative for appetite change and fatigue.   HENT: Negative for congestion, nosebleeds and sore throat.    Eyes: Negative for redness, itching and visual disturbance.   Respiratory: Negative for cough, chest tightness and wheezing.    Cardiovascular: Negative for chest pain and palpitations.   Gastrointestinal: Negative for nausea, vomiting, abdominal pain and constipation.   Endocrine: Negative for heat intolerance and polyphagia.   Genitourinary: Negative for dysuria, urgency, hematuria and flank pain.   Musculoskeletal: Negative for back pain, joint swelling and neck pain.   Skin: Negative for pallor and wound.   Allergic/Immunologic: Negative for environmental allergies and immunocompromised state.   Neurological: Negative for tremors, facial asymmetry and numbness.        Positional vertigo   Hematological: Negative for adenopathy. Does not bruise/bleed easily.   Psychiatric/Behavioral: Negative for suicidal ideas, behavioral problems and decreased concentration.           Objective:    Physical Exam   Constitutional: He is oriented to person, place, and time. He appears well-developed and well-nourished. No distress.   HENT:   Head: Normocephalic and atraumatic.   Mouth/Throat: No oropharyngeal exudate.   Eyes: Conjunctivae and EOM are normal. Pupils are equal, round, and reactive to light. No scleral icterus.   Neck: Normal range of motion. Neck supple. No  thyromegaly present.   Cardiovascular: Normal rate, regular rhythm and normal heart sounds.    No murmur heard.  Pulmonary/Chest: Effort normal and breath sounds normal. He has no wheezes. He exhibits no tenderness.   Abdominal: Soft. Bowel sounds are normal. There is no tenderness. There is no guarding.   Musculoskeletal: Normal range of motion. He exhibits no edema or tenderness.   Lymphadenopathy:     He has no cervical adenopathy.   Neurological: He is alert and oriented to person, place, and time. He has normal reflexes. Coordination normal.   Skin: Skin is warm and dry. No rash noted. No pallor.   Psychiatric: He has a normal mood and affect. Judgment and thought content normal.           Assessment:     HTN WELL CONTROLLED.    HLD ON STATIN WITH LDL 38     CVA STABLE NEUROLOGICALLY.    SCREENING - GI REFERRAL FOR COLONOSCOPY            Plan:     WILL SEEK NEW INTERNIST IN [place].  Procedures

## 2015-07-22 ENCOUNTER — Other Ambulatory Visit: Payer: Self-pay | Admitting: Specialist

## 2015-07-22 DIAGNOSIS — I639 Cerebral infarction, unspecified: Secondary | ICD-10-CM

## 2015-07-24 ENCOUNTER — Ambulatory Visit
Admission: RE | Admit: 2015-07-24 | Discharge: 2015-07-24 | Disposition: A | Discharge status: Home / Self Care | Payer: BLUE CROSS/BLUE SHIELD | Source: Ambulatory Visit | Attending: Specialist | Admitting: Specialist

## 2015-07-24 DIAGNOSIS — I6521 Occlusion and stenosis of right carotid artery: Secondary | ICD-10-CM | POA: Insufficient documentation

## 2015-07-24 DIAGNOSIS — I6522 Occlusion and stenosis of left carotid artery: Secondary | ICD-10-CM | POA: Insufficient documentation

## 2015-07-24 DIAGNOSIS — I639 Cerebral infarction, unspecified: Secondary | ICD-10-CM

## 2015-07-24 DIAGNOSIS — Z8673 Personal history of transient ischemic attack (TIA), and cerebral infarction without residual deficits: Secondary | ICD-10-CM | POA: Insufficient documentation

## 2015-07-26 ENCOUNTER — Encounter (INDEPENDENT_AMBULATORY_CARE_PROVIDER_SITE_OTHER): Payer: Self-pay | Admitting: Internal Medicine

## 2015-07-29 ENCOUNTER — Encounter (INDEPENDENT_AMBULATORY_CARE_PROVIDER_SITE_OTHER): Payer: Self-pay | Admitting: Internal Medicine

## 2015-08-02 ENCOUNTER — Encounter: Payer: Self-pay | Admitting: Gastroenterology

## 2015-08-02 ENCOUNTER — Ambulatory Visit: Payer: BLUE CROSS/BLUE SHIELD | Admitting: Gastroenterology

## 2015-08-02 ENCOUNTER — Ambulatory Visit
Admission: RE | Admit: 2015-08-02 | Discharge: 2015-08-02 | Disposition: A | Discharge status: Home / Self Care | Payer: BLUE CROSS/BLUE SHIELD | Source: Ambulatory Visit | Attending: Gastroenterology | Admitting: Gastroenterology

## 2015-08-02 ENCOUNTER — Encounter
Admission: RE | Disposition: A | Discharge status: Home / Self Care | Payer: Self-pay | Source: Ambulatory Visit | Attending: Gastroenterology

## 2015-08-02 ENCOUNTER — Ambulatory Visit: Payer: BLUE CROSS/BLUE SHIELD | Admitting: Certified Registered"

## 2015-08-02 DIAGNOSIS — E785 Hyperlipidemia, unspecified: Secondary | ICD-10-CM | POA: Insufficient documentation

## 2015-08-02 DIAGNOSIS — G709 Myoneural disorder, unspecified: Secondary | ICD-10-CM | POA: Insufficient documentation

## 2015-08-02 DIAGNOSIS — I10 Essential (primary) hypertension: Secondary | ICD-10-CM | POA: Insufficient documentation

## 2015-08-02 DIAGNOSIS — K5730 Diverticulosis of large intestine without perforation or abscess without bleeding: Secondary | ICD-10-CM | POA: Insufficient documentation

## 2015-08-02 DIAGNOSIS — K219 Gastro-esophageal reflux disease without esophagitis: Secondary | ICD-10-CM | POA: Insufficient documentation

## 2015-08-02 DIAGNOSIS — K648 Other hemorrhoids: Secondary | ICD-10-CM | POA: Insufficient documentation

## 2015-08-02 DIAGNOSIS — I69398 Other sequelae of cerebral infarction: Secondary | ICD-10-CM | POA: Insufficient documentation

## 2015-08-02 DIAGNOSIS — H538 Other visual disturbances: Secondary | ICD-10-CM | POA: Insufficient documentation

## 2015-08-02 DIAGNOSIS — Z1211 Encounter for screening for malignant neoplasm of colon: Secondary | ICD-10-CM | POA: Insufficient documentation

## 2015-08-02 HISTORY — DX: Depression, unspecified: F32.A

## 2015-08-02 HISTORY — DX: Low back pain, unspecified: M54.50

## 2015-08-02 HISTORY — DX: Difficulty in walking, not elsewhere classified: R26.2

## 2015-08-02 HISTORY — DX: Myoneural disorder, unspecified: G70.9

## 2015-08-02 HISTORY — DX: Gastro-esophageal reflux disease without esophagitis: K21.9

## 2015-08-02 HISTORY — DX: Unspecified osteoarthritis, unspecified site: M19.90

## 2015-08-02 HISTORY — DX: Pneumonia, unspecified organism: J18.9

## 2015-08-02 HISTORY — PX: COLONOSCOPY: SHX174

## 2015-08-02 HISTORY — DX: Hyperlipidemia, unspecified: E78.5

## 2015-08-02 HISTORY — DX: Dermatitis, unspecified: L30.9

## 2015-08-02 HISTORY — DX: Headache, unspecified: R51.9

## 2015-08-02 HISTORY — DX: Unspecified hearing loss, unspecified ear: H91.90

## 2015-08-02 HISTORY — DX: Cerebral infarction, unspecified: I63.9

## 2015-08-02 HISTORY — DX: Unspecified visual disturbance: H53.9

## 2015-08-02 HISTORY — DX: Unspecified disorder of ear, unspecified ear: H93.90

## 2015-08-02 SURGERY — DONT USE, USE 1094-COLONOSCOPY, DIAGNOSTIC (SCREENING)
Anesthesia: Anesthesia General | Site: Abdomen | Wound class: Clean Contaminated

## 2015-08-02 MED ORDER — PROPOFOL INFUSION 10 MG/ML
INTRAVENOUS | Status: DC | PRN
Start: 2015-08-02 — End: 2015-08-02
  Administered 2015-08-02: 200 ug/kg/min via INTRAVENOUS

## 2015-08-02 MED ORDER — LIDOCAINE HCL 2 % IJ SOLN
INTRAMUSCULAR | Status: DC | PRN
Start: 2015-08-02 — End: 2015-08-02
  Administered 2015-08-02: 40 mg

## 2015-08-02 MED ORDER — LACTATED RINGERS IV SOLN
INTRAVENOUS | Status: DC
Start: 2015-08-02 — End: 2015-08-02

## 2015-08-02 MED ORDER — PROPOFOL INFUSION 10 MG/ML
INTRAVENOUS | Status: DC | PRN
Start: 2015-08-02 — End: 2015-08-02
  Administered 2015-08-02: 70 mg via INTRAVENOUS

## 2015-08-02 SURGICAL SUPPLY — 18 items
CANISTER 1000CC (Procedure Accessories) ×2 IMPLANT
GOWN ISO YELLOW UNIVERSAL (Gown) ×4 IMPLANT
KIT CARRY ON ENDO PROCEDURE (Kits) ×2
KIT CARRY ON ENDO PROCEDURE CPK4557000001 (Kits) IMPLANT
KIT UNIVERSAL IRRIGATION SOL (Kits) ×2 IMPLANT
SOFT-CUF 2T ADULT SUB-MIN (Cuff) ×2 IMPLANT
SOLN LUBRICATING JELLY 4.25OZ (Irrigation Solutions) ×2 IMPLANT
SPONGE GAUZE L4 IN X W4 IN 16 PLY (Dressing) ×10
SPONGE GAUZE L4 IN X W4 IN 16 PLY MAXIMUM ABSORBENT USP TYPE VII (Dressing) ×10 IMPLANT
SPONGE GZE CTTN CRTY 4X4IN LF NS 16 PLY (Dressing) ×10
SYRINGE 50 ML GRADUATE NONPYROGENIC DEHP (Syringes, Needles) ×1
SYRINGE 50 ML GRADUATE NONPYROGENIC DEHP FREE PVC FREE BD MEDICAL (Syringes, Needles) ×1 IMPLANT
SYRINGE MED 50ML LF STRL GRAD N-PYRG (Syringes, Needles) ×1
WATER STERILE PLASTIC POUR BOTTLE 1000 (Irrigation Solutions) ×1
WATER STERILE PLASTIC POUR BOTTLE 1000 ML (Irrigation Solutions) ×1 IMPLANT
WATER STERILE PLASTIC POUR BOTTLE 250 ML (Irrigation Solutions) ×1 IMPLANT
WATER STRL 1000ML LF PLS PR BTL (Irrigation Solutions) ×1
WATER STRL 250ML LF PLS PR BTL (Irrigation Solutions) ×1

## 2015-08-02 NOTE — Transfer of Care (Signed)
Anesthesia Transfer of Care Note    Patient: Blake Sheer, MD    Procedures performed: Procedure(s):  COLONOSCOPY    Anesthesia type: General TIVA    Patient location:[facility]    Last vitals:   Filed Vitals:    08/02/15 1141   BP: 101/57   Pulse: 59   Temp: 36.1 C (97 F)   Resp: 16   SpO2: 96%       Post pain: Patient not complaining of pain, continue current therapy      Mental Status:awake and drowsy    Respiratory Function: tolerating nasal cannula    Cardiovascular: stable    Nausea/Vomiting: patient not complaining of nausea or vomiting    Hydration Status: adequate    Post assessment: no apparent anesthetic complications and no reportable events

## 2015-08-02 NOTE — Anesthesia Preprocedure Evaluation (Signed)
Anesthesia Evaluation    AIRWAY    Mallampati: II    TM distance: >3 FB  Neck ROM: full  Mouth Opening:full   CARDIOVASCULAR    cardiovascular exam normal       DENTAL    no notable dental hx     PULMONARY    pulmonary exam normal     OTHER FINDINGS                      Anesthesia Plan    ASA 3     general               (Baseline SBP 120-130s - hx CVA and carotid stenosis - plan to maintain normotension intraop)      intravenous induction   Detailed anesthesia plan: general IV        Post op pain management: per surgeon    informed consent obtained

## 2015-08-02 NOTE — H&P (Signed)
GI PRE PROCEDURE NOTE    Proceduralist Comments:   Review of Systems and Past Medical / Surgical History performed: Yes     Indications:Colon cancer screening    Previous Adverse Reaction to Anesthesia or Sedation (if yes, describe): No    Physical Exam / Laboratory Data (If applicable)   Airway Classification: Class II    General: Alert and cooperative  Lungs: Lungs clear to auscultation  Cardiac: RRR, normal S1S2.    Abdomen: Soft, non tender. 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 Risks, benefits and alternatives to the planned procedure and sedation have been explained to the patient or guardian:  yes        Chinita Greenland, MD  08/02/2015  11:05 AM

## 2015-08-02 NOTE — Anesthesia Postprocedure Evaluation (Signed)
Anesthesia Post Evaluation    Patient: Blake Sheer, MD    Procedures performed: Procedure(s):  COLONOSCOPY    Anesthesia type: General TIVA    Patient location:[facility]    Last vitals:   Filed Vitals:    08/02/15 1215   BP: 109/54   Pulse: 64   Temp:    Resp: 20   SpO2: 99%       Post pain: Patient not complaining of pain, continue current therapy      Mental Status:awake and alert     Respiratory Function: tolerating room air    Cardiovascular: stable    Nausea/Vomiting: patient not complaining of nausea or vomiting    Hydration Status: adequate    Post assessment: no apparent anesthetic complications, no reportable events and no evidence of recall

## 2015-08-02 NOTE — Discharge Instructions (Signed)
Endoscopy Discharge Instructions  General Instructions:  1. Following sedation, your judgement, perception, and coordination are considered impaired. Even though you may feel awake and alert, you are considered legally intoxicated. Therefore, until the next morning;   Do not Drive   Do not operate appliances or equipment that requires reaction time (e.g. Stove,electrical tools, machinery)   Do not sign legal documents or be involved in important decisions.   Do not smoke if alone   Do not drink alcoholic beverages   Go directly home and rest for several hours before resuming your routine activities.   It is highly recommended to have a responsible adult stay with you for the next 24 hours    2. Tenderness, swelling or pain may occur at the IV site where you received sedation. If you experience this, apply warm soaks to the area. Notify your physician if this persists.    Instructions Specific To Procedures - Report To Physician Any Of The Following:     Colonoscopy/Sigmoidoscopy/Proctoscopy   1. Severe and persistent abdominal pain/bloating which does not subside within 2-3 hours   2. Large amount of rectal bleeding (some mucosal blood streaking may occur, especially if biopsy or polypectomy was done or if hemorrhoids are present).   3. Nausea/vomiting   4. Fevers/Chills within 24 hours after procedure. Temp>101deg F     In Addition:      If a polyp has been removed or biopsies were taken, you may continue aspirin, unless otherwise directed to discontinue the medication by your treating physicians. Do not take additional NSAIDs (non-steroidal anti-inflammatory drugs), e.g. Anacin, Alka Seltzer, Bufferin, naproxen, ibuprofen, etc., without consulting with your treating physicians. You may take acetaminophen prouducts, such as Tylenol, as needed.                 Additional Discharge Instructions  Your diet after the procedure: No restriction.  Special Instructions: None  Prescriptions given: No  Patient  education literature given; No      If you have questions or problems contact your MD immediately. If you need immediate attention, call your MD, 911 and/or go to nearest emergency room.    Anmol Paschen J Hollis Oh, MD  Ph: [phone]

## 2015-08-08 ENCOUNTER — Encounter: Payer: Self-pay | Admitting: Gastroenterology

## 2015-08-20 ENCOUNTER — Encounter (INDEPENDENT_AMBULATORY_CARE_PROVIDER_SITE_OTHER): Payer: Self-pay | Admitting: Internal Medicine

## 2015-09-16 ENCOUNTER — Other Ambulatory Visit (INDEPENDENT_AMBULATORY_CARE_PROVIDER_SITE_OTHER): Payer: Self-pay | Admitting: Internal Medicine

## 2015-09-16 ENCOUNTER — Other Ambulatory Visit: Payer: BLUE CROSS/BLUE SHIELD

## 2015-09-16 DIAGNOSIS — I639 Cerebral infarction, unspecified: Secondary | ICD-10-CM

## 2015-09-16 DIAGNOSIS — E782 Mixed hyperlipidemia: Secondary | ICD-10-CM

## 2015-09-16 DIAGNOSIS — I10 Essential (primary) hypertension: Secondary | ICD-10-CM

## 2015-09-16 DIAGNOSIS — Z0000 Encounter for general adult medical examination without abnormal findings: Secondary | ICD-10-CM

## 2015-09-16 MED ORDER — ROSUVASTATIN CALCIUM 40 MG PO TABS
40.0000 mg | ORAL_TABLET | Freq: Every day | ORAL | Status: AC
Start: 2015-09-16 — End: ?

## 2015-09-17 ENCOUNTER — Encounter (INDEPENDENT_AMBULATORY_CARE_PROVIDER_SITE_OTHER): Payer: Self-pay | Admitting: Internal Medicine

## 2015-10-04 ENCOUNTER — Other Ambulatory Visit (INDEPENDENT_AMBULATORY_CARE_PROVIDER_SITE_OTHER): Payer: Self-pay | Admitting: Internal Medicine

## 2015-10-04 DIAGNOSIS — I10 Essential (primary) hypertension: Secondary | ICD-10-CM

## 2015-10-04 MED ORDER — LOSARTAN POTASSIUM 50 MG PO TABS
50.0000 mg | ORAL_TABLET | Freq: Two times a day (BID) | ORAL | Status: AC
Start: 2015-10-04 — End: ?

## 2016-08-13 ENCOUNTER — Encounter (INDEPENDENT_AMBULATORY_CARE_PROVIDER_SITE_OTHER): Payer: Self-pay | Admitting: Internal Medicine

## 2021-12-31 IMAGING — CT CT NECK ANGIO WITH AND WITHOUT IV CONTRAST
1 of 6 series · 3 of 14 positions shown · non-contrast
Comparison: none

Bilat carotid stenosis; f/u ultrasound done in physician's office
FINAL REPORT:
HISTORY: Bilateral carotid stenosis.
CTA neck with and without contrast.
TECHNIQUE: Informed consent obtained prior to administration of nonionic intravenous contrast administered without adverse response. Axial CTA imaging obtained from the aortic arch through skull base. Sagittal and coronal maximum intensity 3-D projections obtained as well. Imaging begins with noncontrast scout views

[Series 2: carotid thins · axial · 0.62mm/px · z∈[+32,+173]mm · 3 of 451 slices shown]
[im 113/451  soft-tissue]
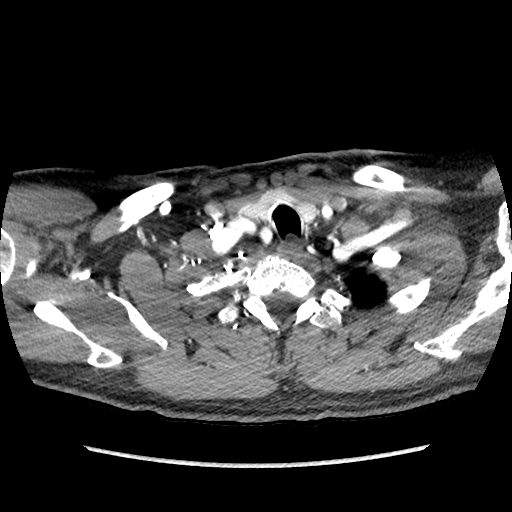
[im 226/451  bone]
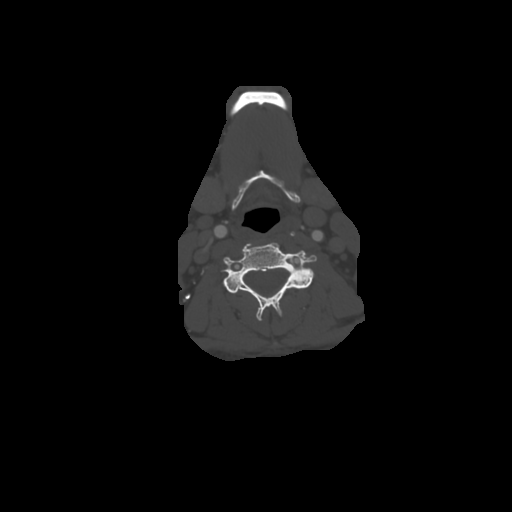
[im 338/451  soft-tissue]
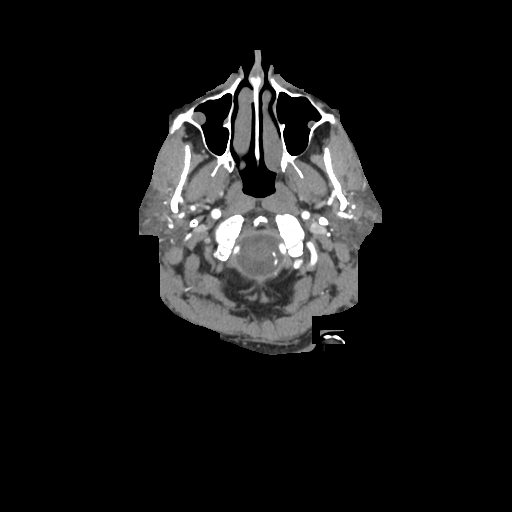

[3 of 14 positions shown; findings below may reference images not displayed]

FINDINGS: The lung apices are clear.
Moderate atherosclerotic calcification of the aortic arch. The brachiocephalic, left common carotid, and left subclavian artery origins are patent. The bilateral vertebral artery origins are patent with the left vertebral artery origin mildly calcified. The bilateral extracranial vertebral arteries are patent, left slightly dominant. The bilateral common carotid arteries are patent.
Severe atherosclerotic calcification of the bilateral carotid bulbs. Short segment narrowing of the right ICA approximately 30%. The right ECA is patent. Short segment narrowing of the left ICA of 70-80%. 50% short segment narrowing left ECA origin. Extracranial ICA and ECA vessels are otherwise patent.
Bilateral 50% short segment narrowing of the visible portions of the intracranial vertebral arteries proximal to the PICA vessels. Additional short segment narrowing of the post PICA right vertebral artery greater than 70%. Moderate atherosclerotic calcification of the distal portions of the intracranial cavernous and supraclinoid ICAs with short segment stenoses not exceeding 50%. A partially visualized left MCA trifurcation saccular aneurysm is suspected measuring 6 x 6 mm (series 2 image 431).
Bone windows demonstrate no aggressive osseous lesions.
IMPRESSION: 1. 70-80% short segment narrowing left ICA just beyond the bifurcation.
2. 30% short segment narrowing right ICA just beyond the bifurcation.
3. 50% left ECA origin stenosis.
4. Bilateral intracranial vertebral artery stenoses 50% on the left and greater than 70% post-PICA vertebral artery on the right.
5. Up to 50% stenoses bilateral intracranial ICA vessels.
6. Partially visualized suspected saccular aneurysm left MCA trifurcation measuring 6 x 6 mm. Recommend dedicated CTA head or MRA head for further characterization.
All CT scans at this facility use dose modulation and/or weight based dosing when appropriate to reduce radiation dose to as low as reasonably achievable. (#[id number]#)

## 2022-02-02 IMAGING — CR XR CHEST 1 VIEW
1 series · 2 of 2 positions shown · non-contrast
Comparison: None available.

Palpitations
FINAL REPORT:
XR CHEST 1 VIEW
INDICATION: PALPITATIONS

[Series 676: AP · 2 of 2 slices shown]
[im 1/2]
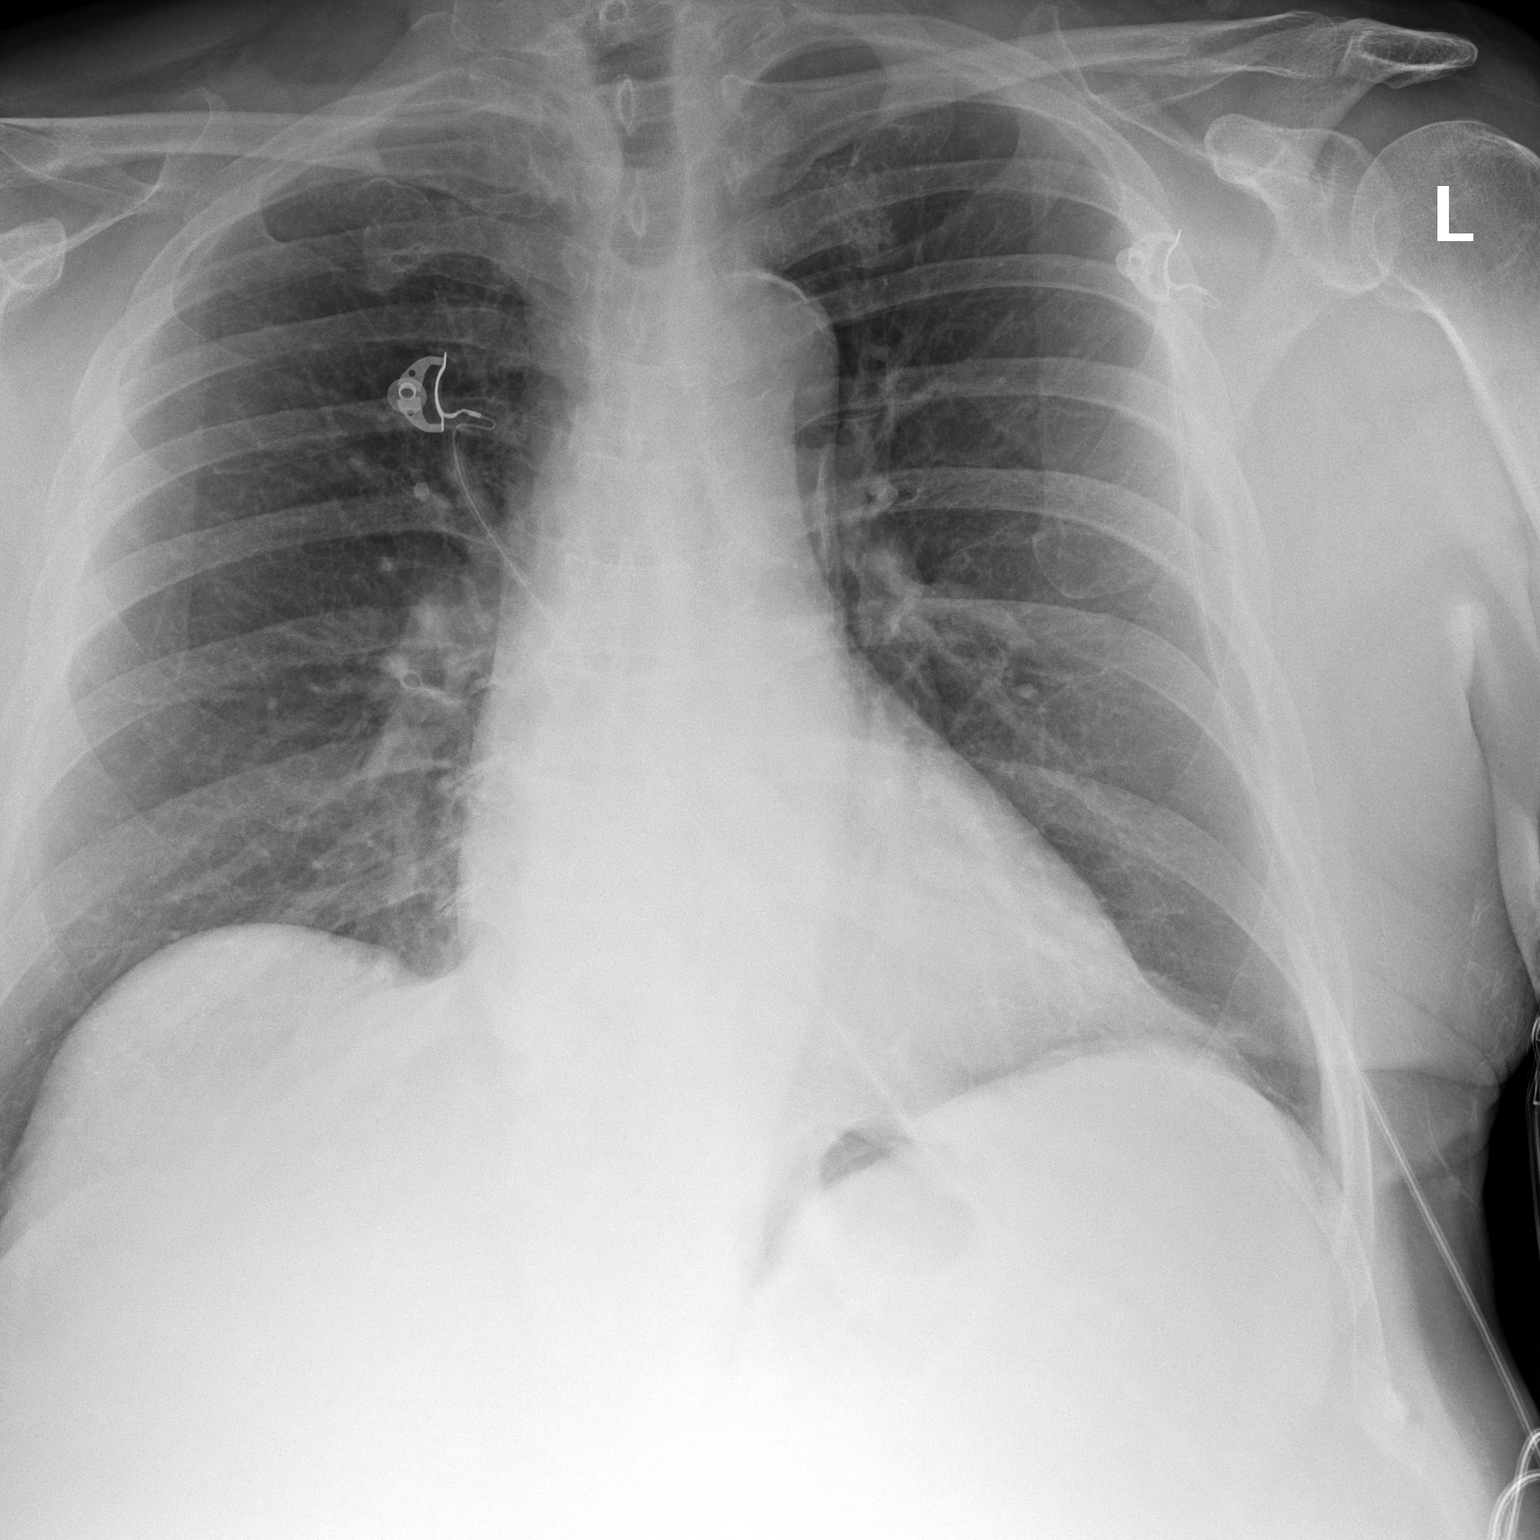
[im 2/2]
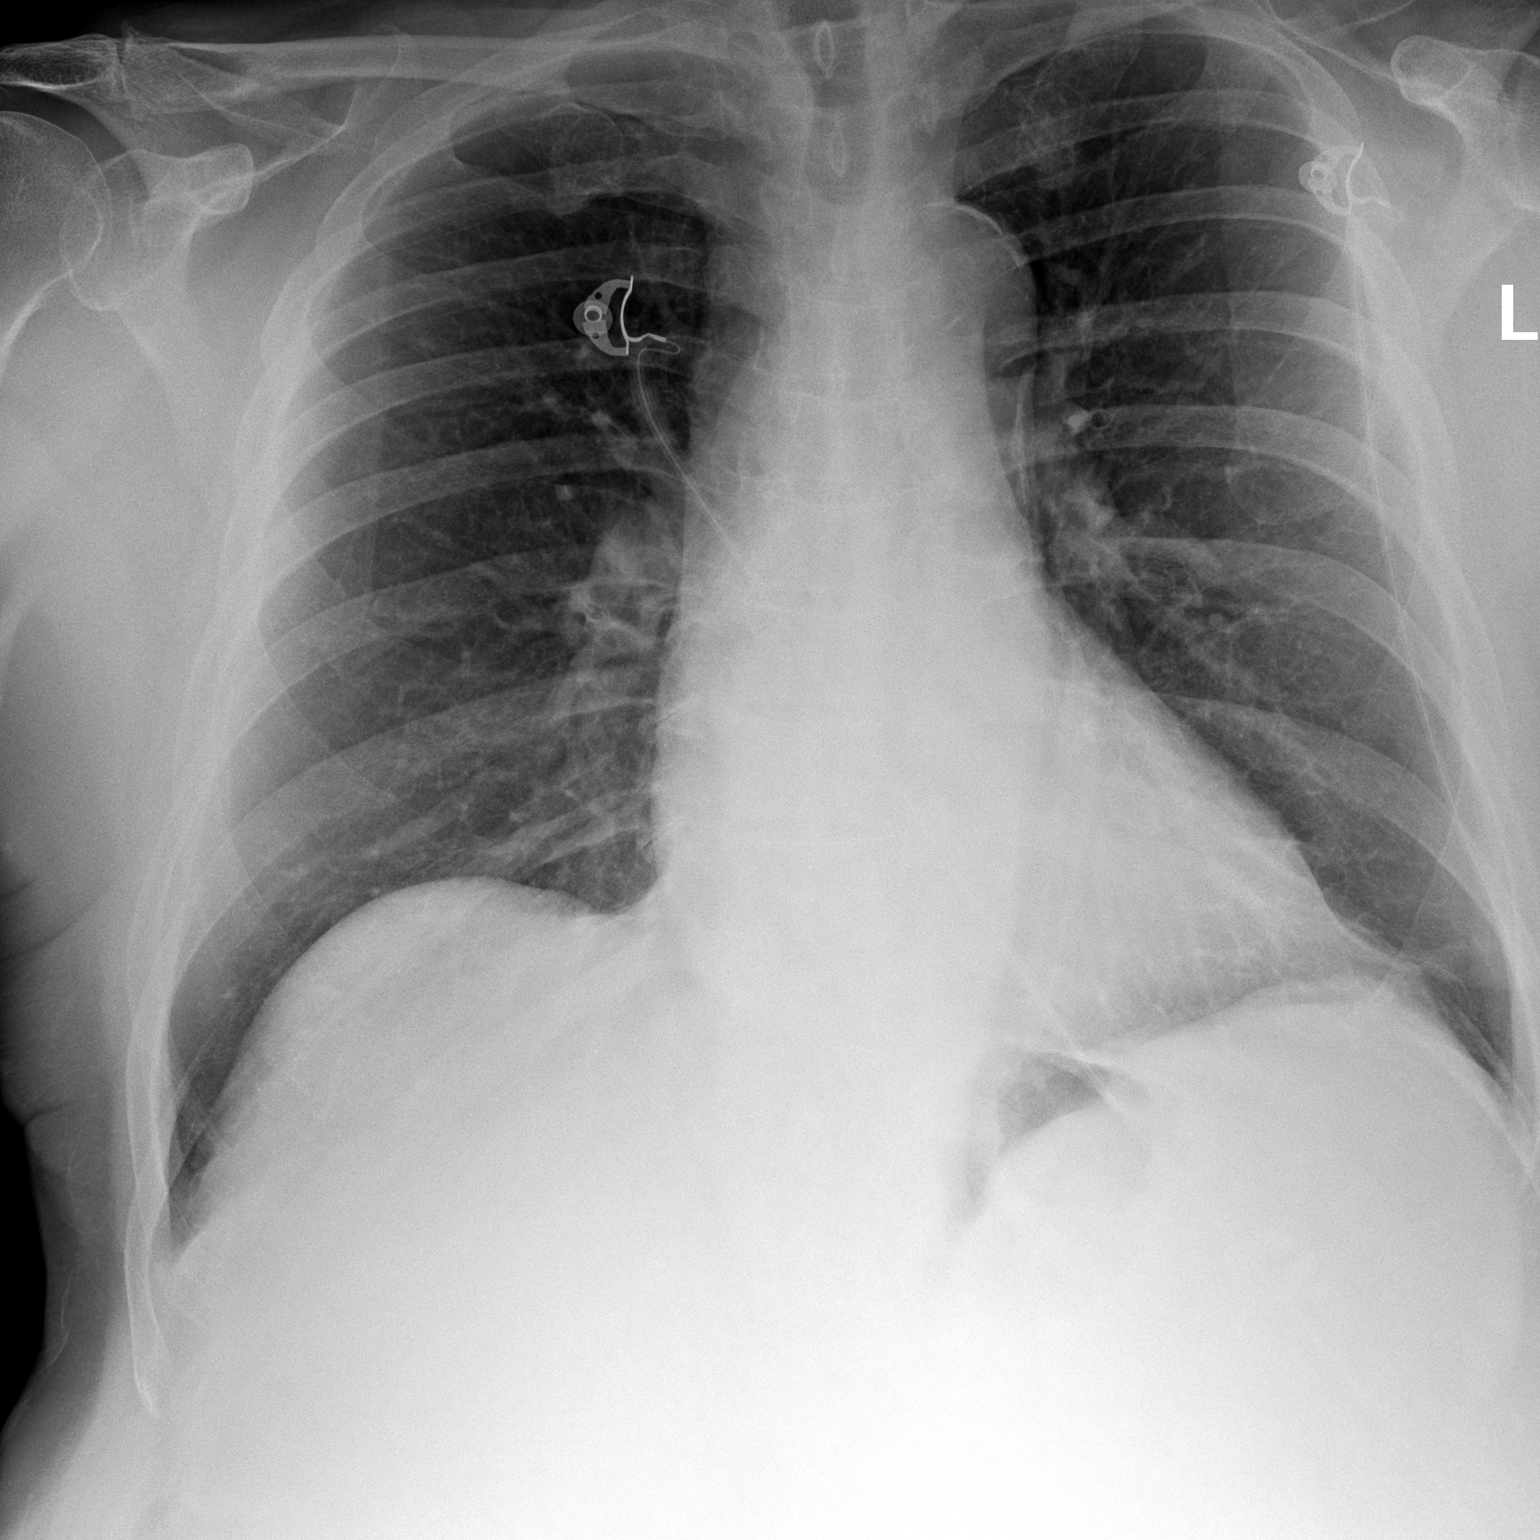

[2 of 2 positions shown; findings below may reference images not displayed]

FINDINGS: 1 view of the chest was obtained.
Lungs/pleura: Lungs are clear. No pneumothorax or pleural effusion. Pulmonary vasculature is normal.
Cardiomediastinum: Normal heart size. Minimal plaque at the aortic arch.
Bones: No acute osseous abnormalities.
Other: None.
IMPRESSION: 
IMPRESSION: No acute cardiopulmonary process.
Portable?
Yes

## 2023-05-22 IMAGING — CR XR CHEST 1 VIEW
1 series · 1 of 1 positions shown · non-contrast
Comparison: 02/02/2022

FINAL REPORT:
XR CHEST 1 VIEW
INDICATION: cp

[AP]
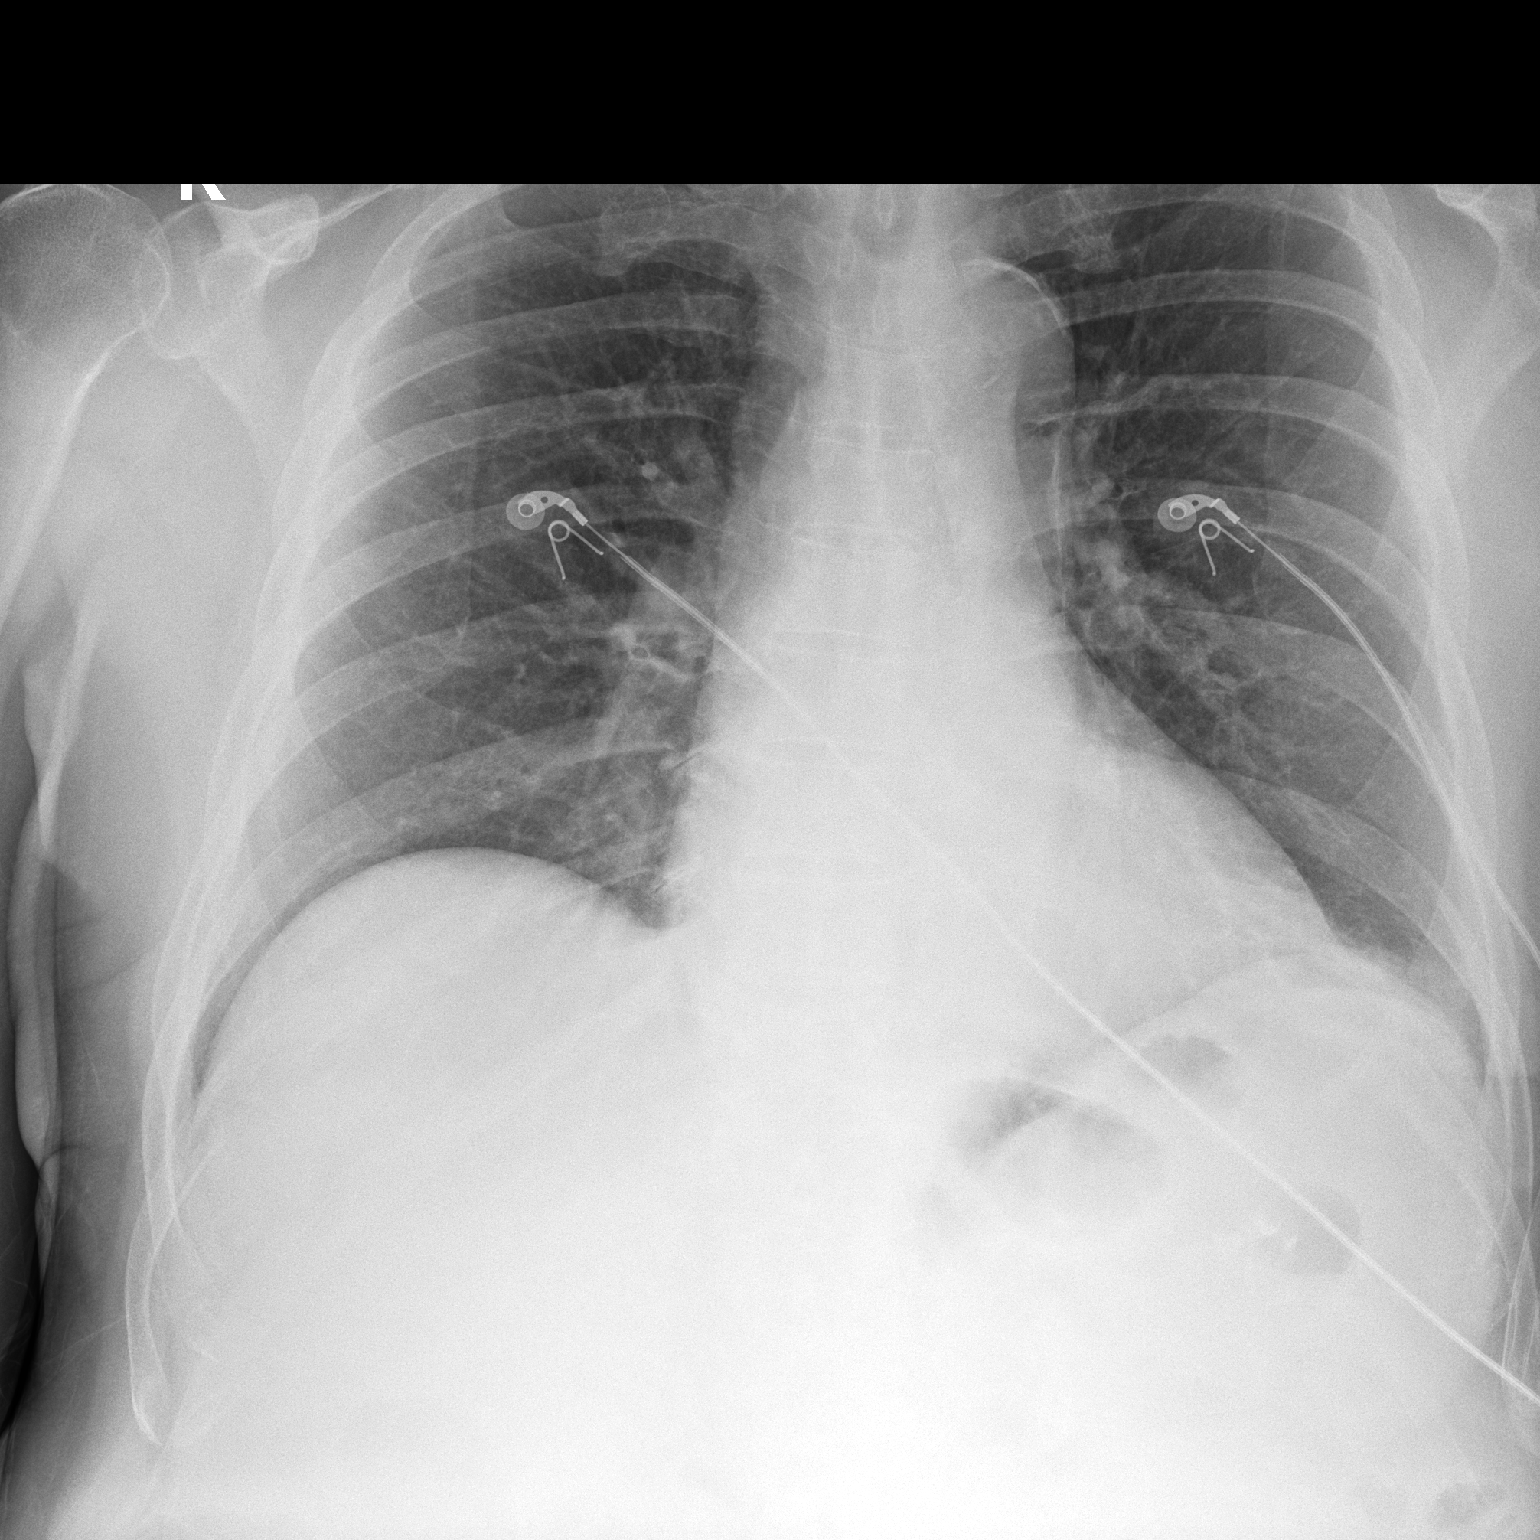

[1 of 1 positions shown; findings below may reference images not displayed]

FINDINGS: 1 view of the chest was obtained.
Lungs/pleura: Lungs are clear. No pneumothorax or pleural effusion. Pulmonary vasculature is normal.
Cardiomediastinum: Normal heart size. Minimal plaque at the aortic arch.
Bones: No acute osseous abnormalities.
Other: None.
IMPRESSION: 
IMPRESSION: No acute cardiopulmonary process.

## 2023-05-25 IMAGING — US US LOW EXT VEINS LEFT MAPPING
1 series · 9 of 9 positions shown · non-contrast
Comparison: none

FINAL REPORT:
INDICATION: Atherosclerotic heart disease of native coronary artery without angina pectoris Encounter for other preprocedural examination
EXAM: Bilateral lower extremity vein mapping.

[Series 1: us low ext veins left mapping · 9 of 9 slices shown]
[im 1/9]
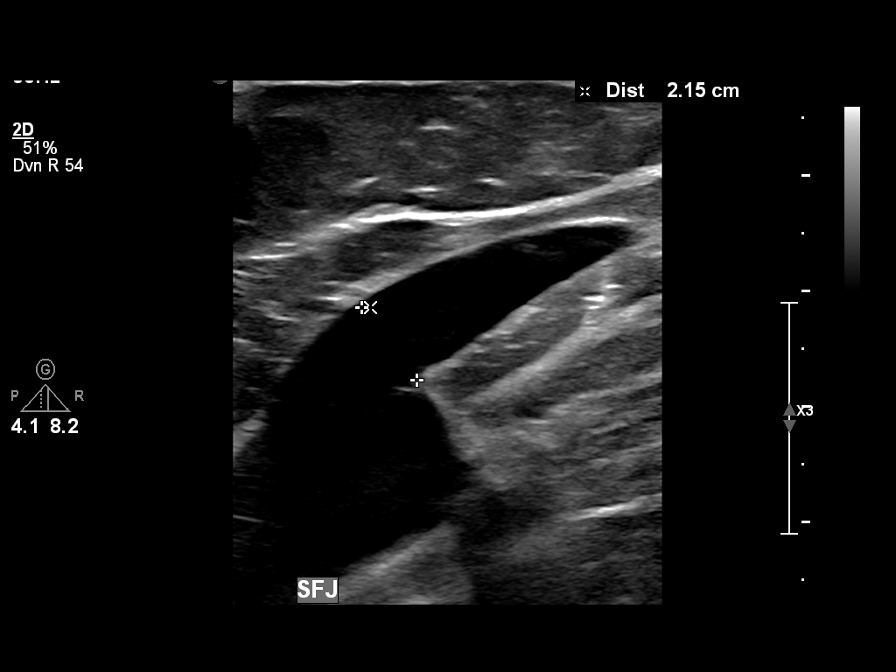
[im 2/9]
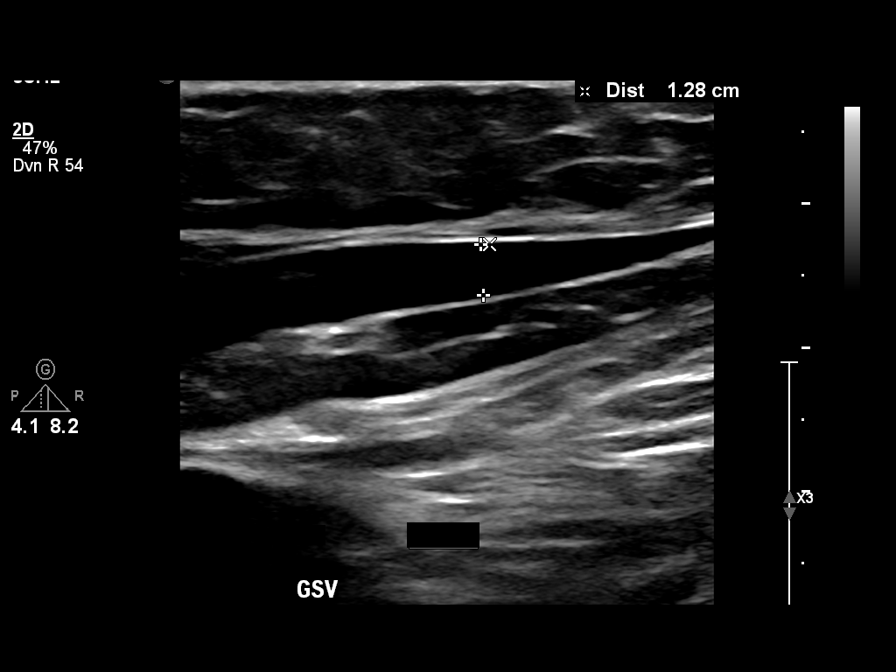
[im 3/9]
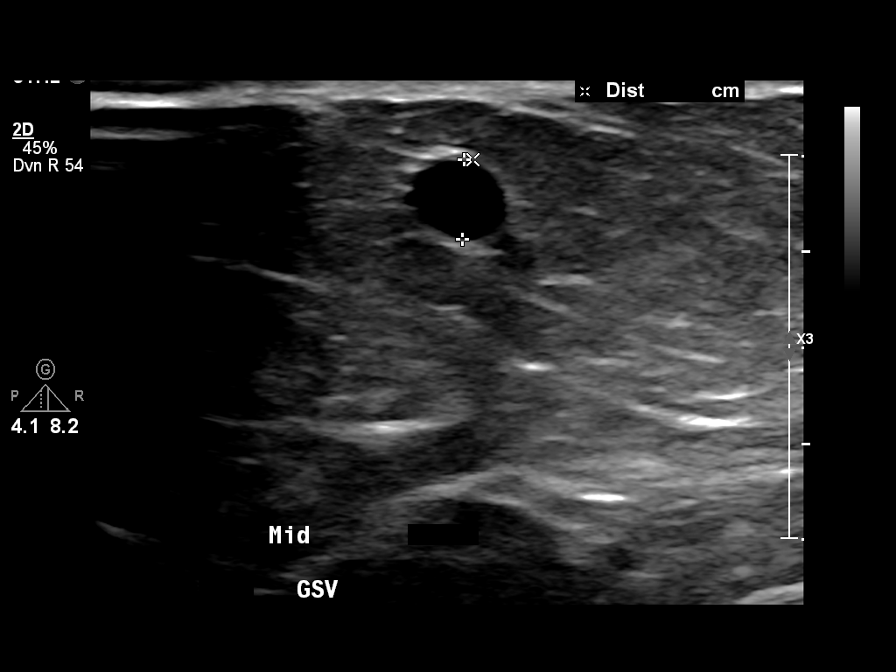
[im 4/9]
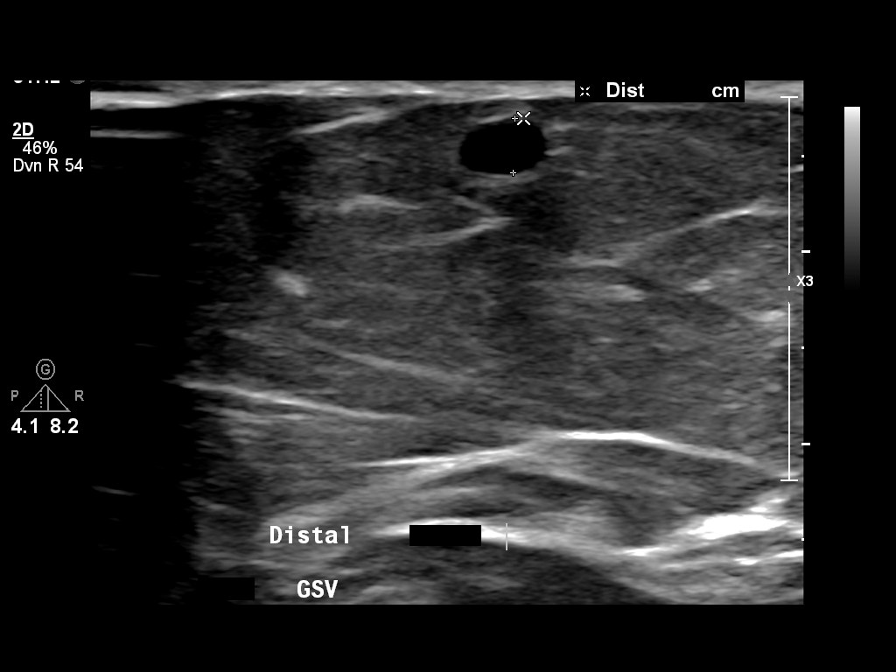
[im 5/9]
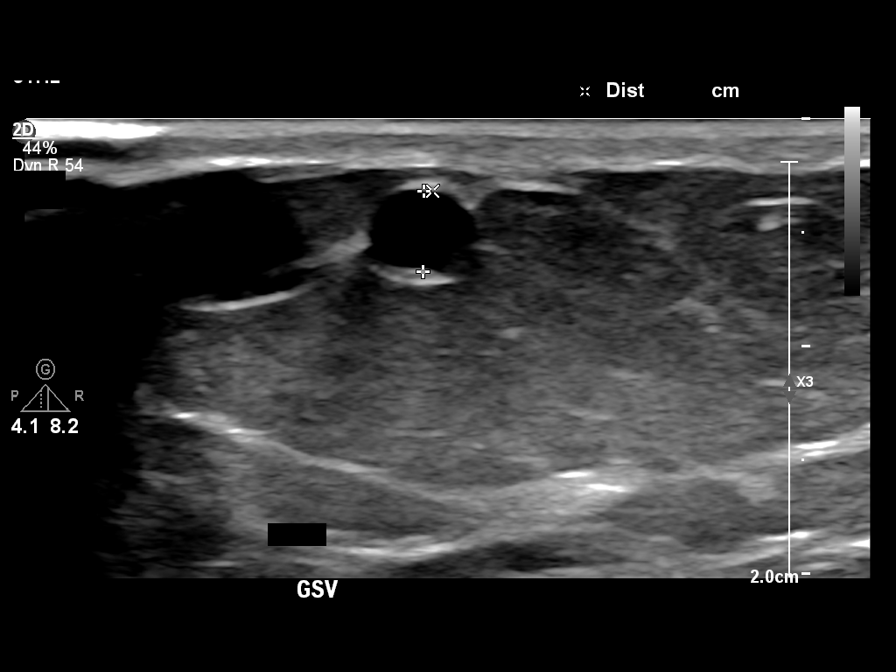
[im 6/9]
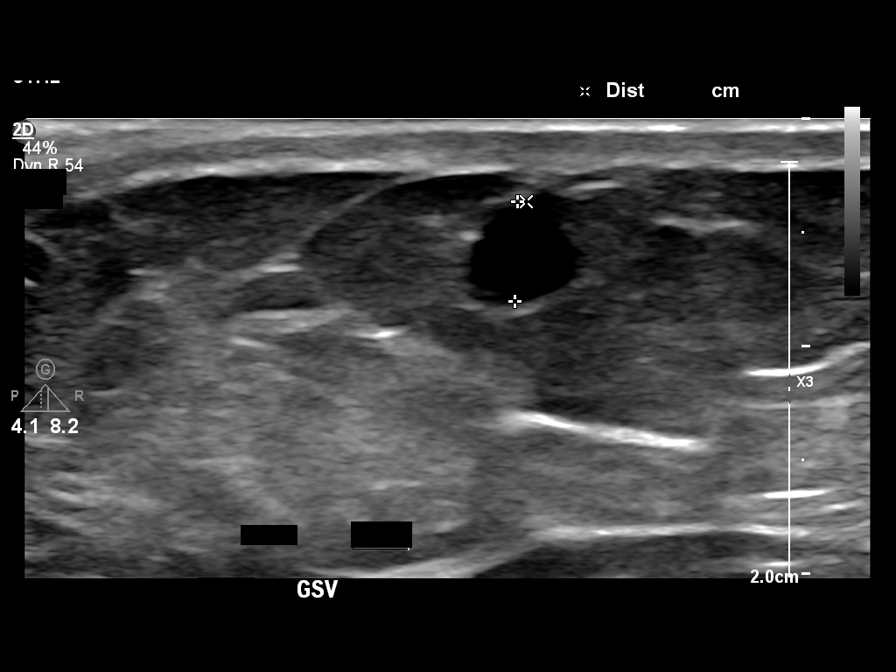
[im 7/9]
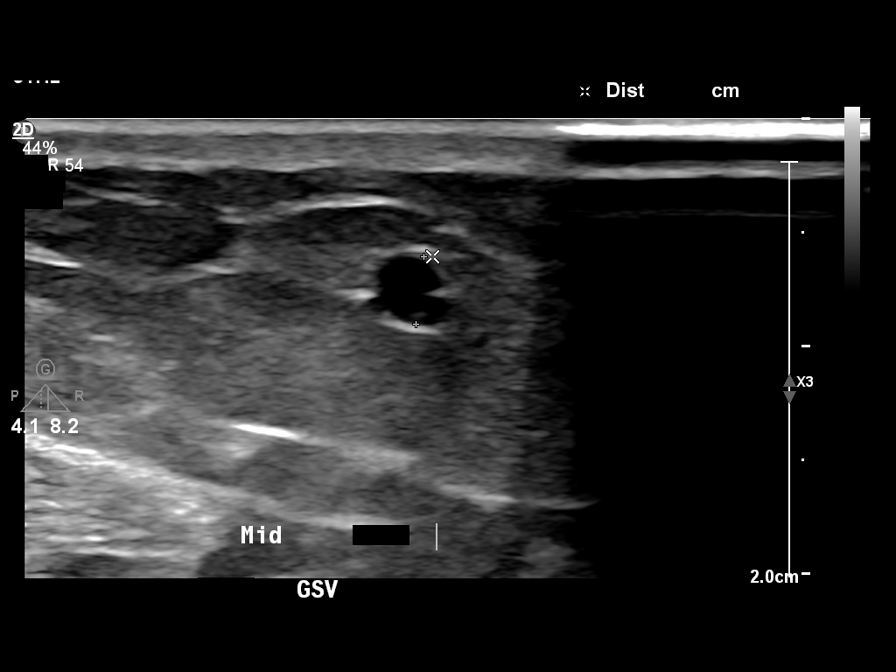
[im 8/9]
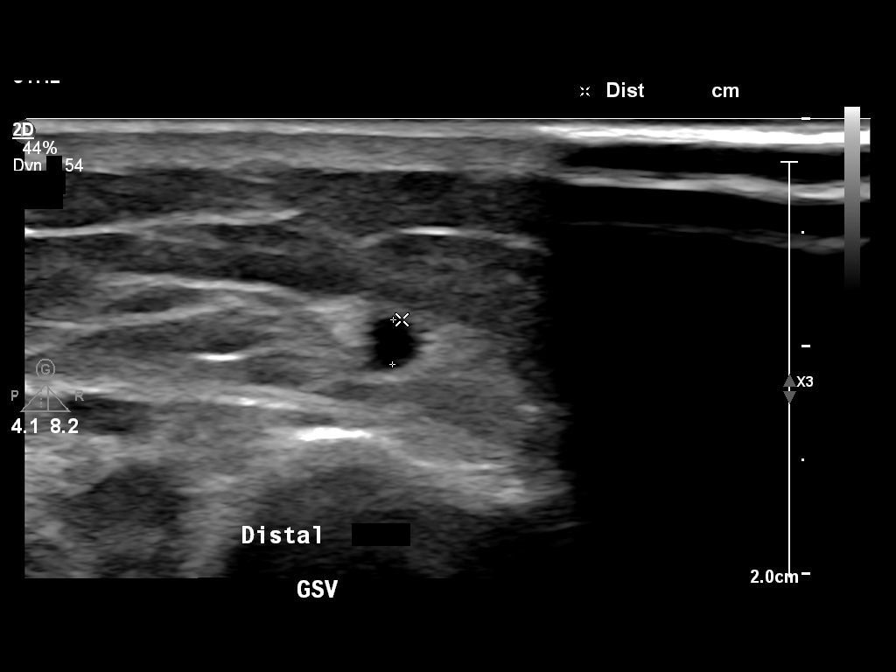
[im 9/9]
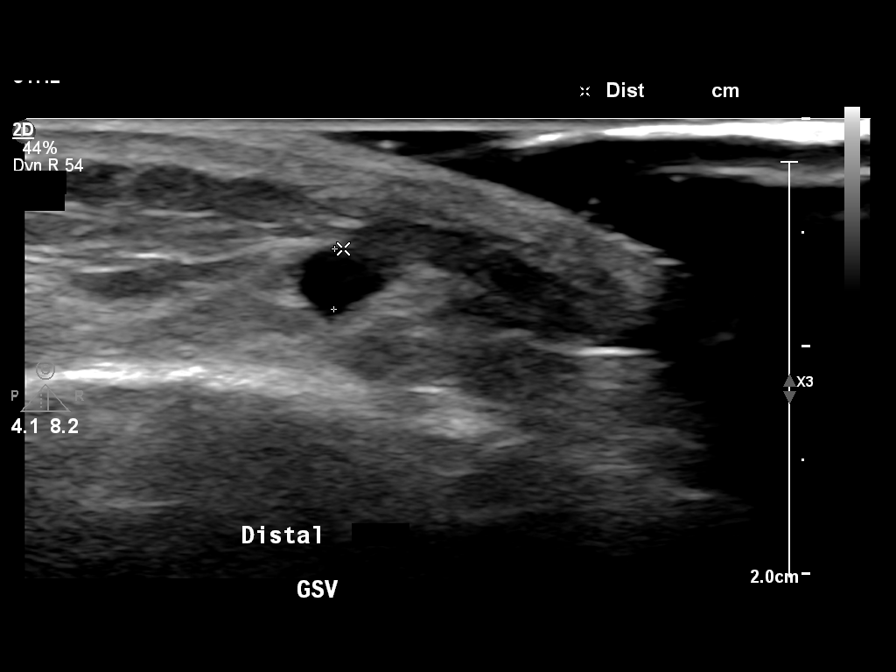

[9 of 9 positions shown; findings below may reference images not displayed]

FINDINGS: Right:
GSV Saphenofemoral junction: 0.32 cm and 1.8 cm deep
GSV Upper thigh: 0.28 cm  and 1.5 cm deep
GSV mid thigh: 0.36 cm and 0.42 cm deep
GSV Lower thigh: 0.27 cm and 0.42 cm deep
GSV Knee: 0.23 cm and 0.32 cm deep
GSV Upper calf: 2.2 cm and 0.59 cm deep
GSV mid calf: 0.3 cm and 0.33 cm deep
GSV Lower calf: 0.31 cm and 0.83 cm deep
Left:
GSV Saphenofemoral junction: 0.79 cm and 2.8 cm deep
GSV Upper thigh: 0.25 cm and 1.3 cm deep
GSV mid thigh: 0.42 cm and 0.52 cm deep
GSV Lower thigh: 0.28 cm and 0.31 cm deep
GSV Knee: 0.35 cm and 0.32 cm deep
GSV Upper calf: 0.44 cm and 0.37 cm deep
GSV mid calf: 0.30 cm and 0.61 cm deep
GSV Lower calf: 0.2 cm and 0.89 cm deep
IMPRESSION: Greater saphenous vein diameters and depths as above

## 2023-05-25 IMAGING — CT CT CHEST WITHOUT CONTRAST
2 of 4 series · 15 of 36 positions shown, 18 images · non-contrast
Comparison: None available

Chest pain, hx suspected aortic aneurysm
FINAL REPORT:
INDICATION: Aortic aneurysm, known or suspected
Exam: CT of the chest without IV Contrast
Submitted images:
All CT scans at this facility use iterative reconstruction technique, dose modulation and/or weight based dosing when appropriate to reduce radiation dose to as low as reasonably achievable.

[Series 2: chest w/o · axial · non-contrast · 0.90mm/px · z∈[-297,+3]mm · 12 of 142 slices shown, 15 images]
[im 11/142  mediastinal]
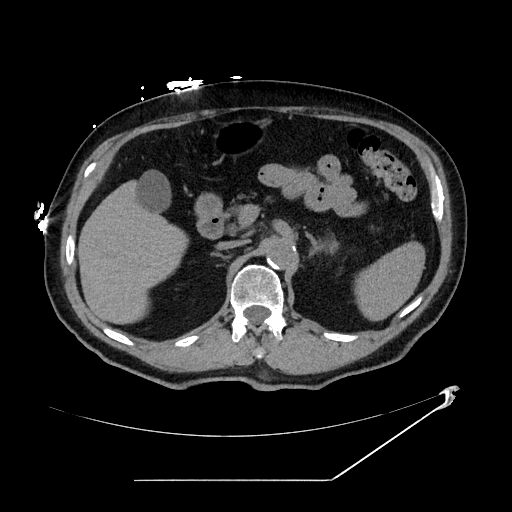
[im 11/142  lung]
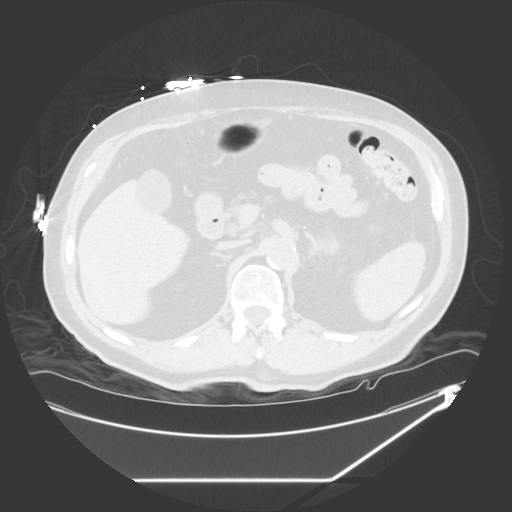
[im 22/142  lung]
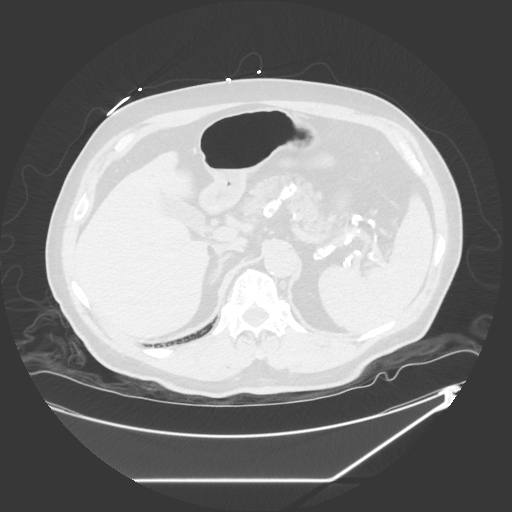
[im 33/142  lung]
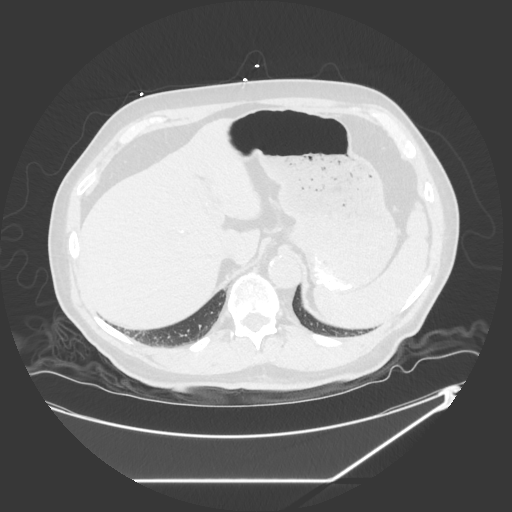
[im 44/142  lung]
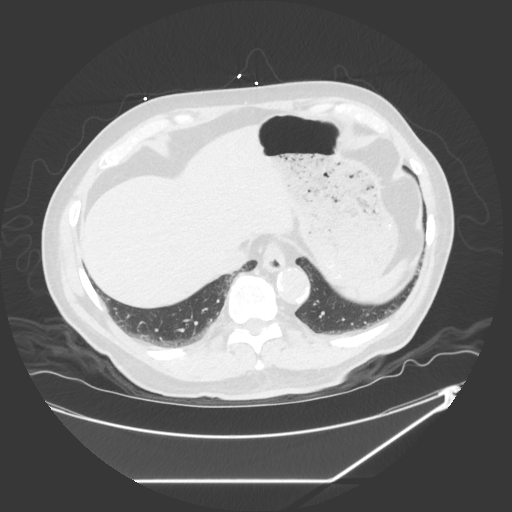
[im 55/142  mediastinal]
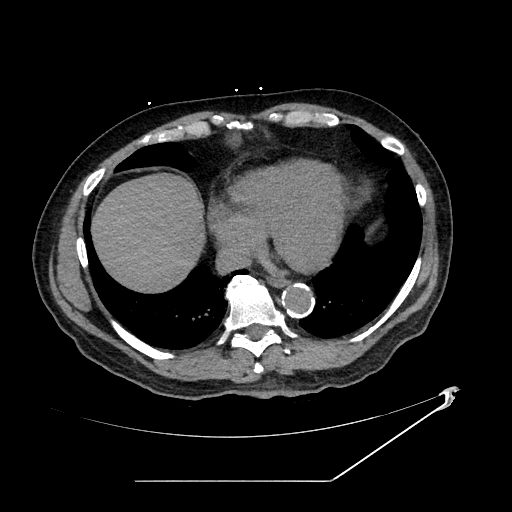
[im 55/142  lung]
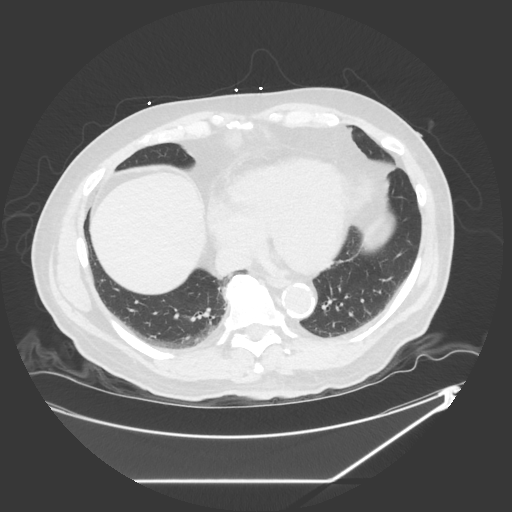
[im 66/142  lung]
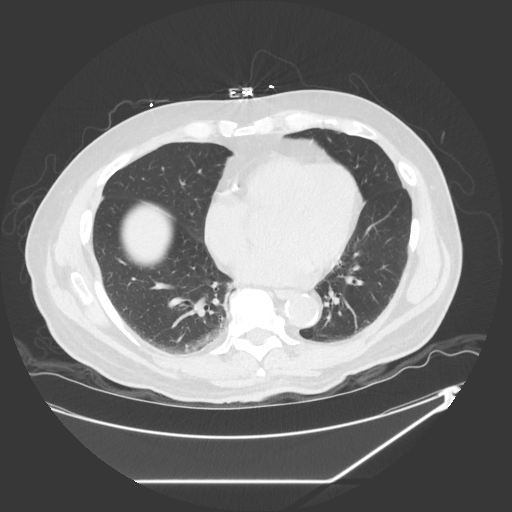
[im 76/142  lung]
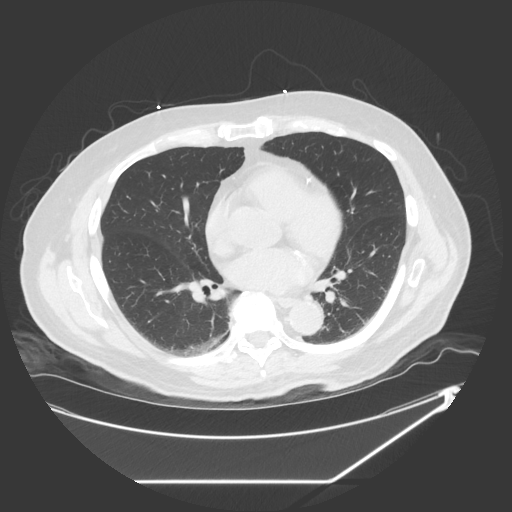
[im 87/142  lung]
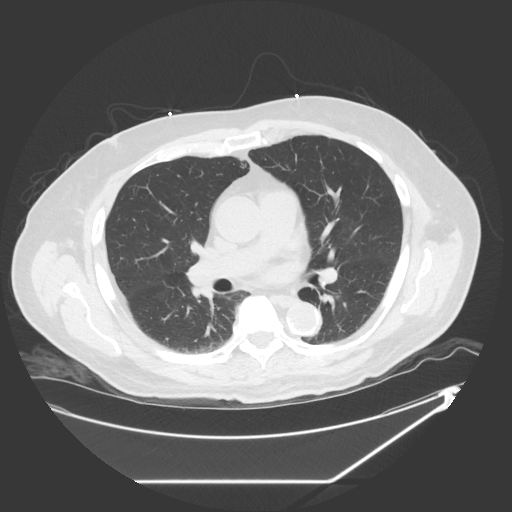
[im 98/142  mediastinal]
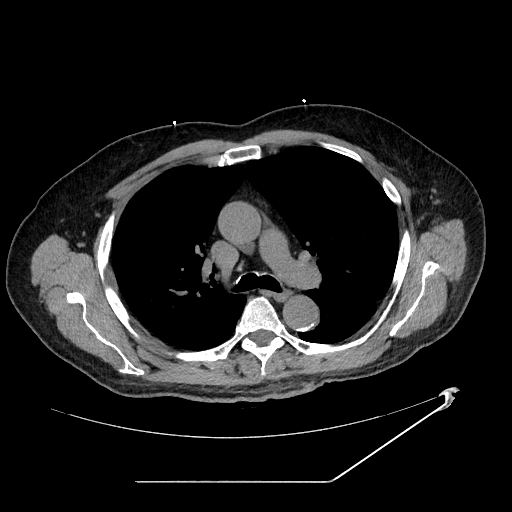
[im 98/142  lung]
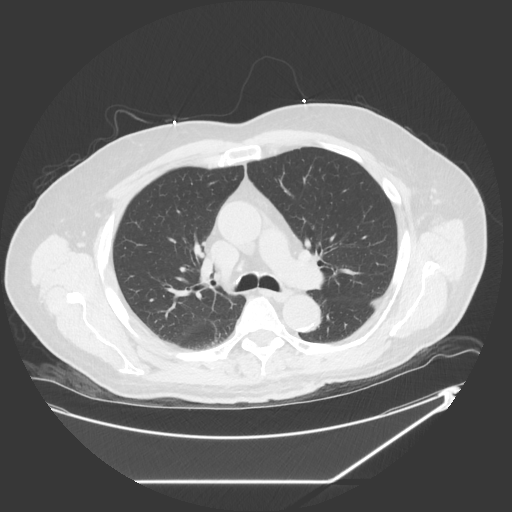
[im 109/142  lung]
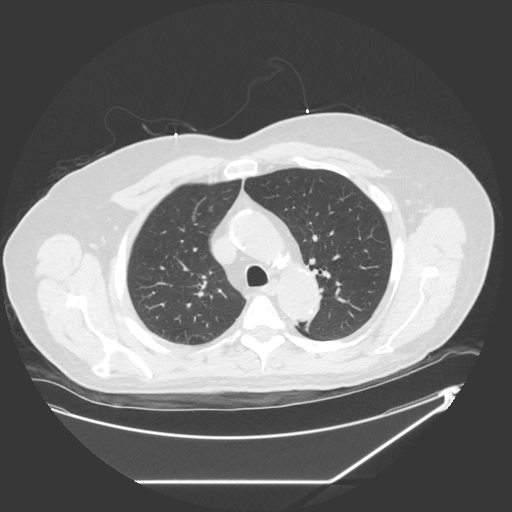
[im 120/142  lung]
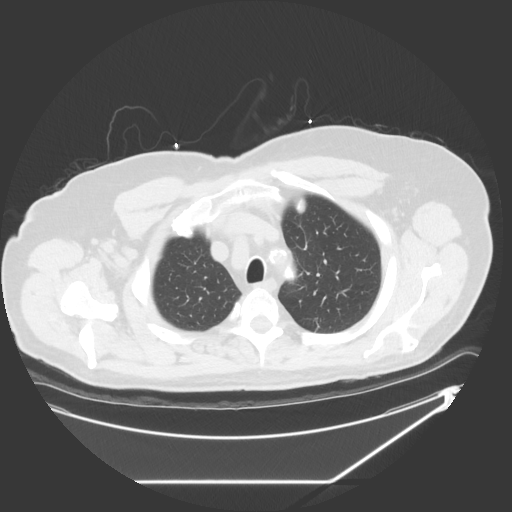
[im 131/142  lung]
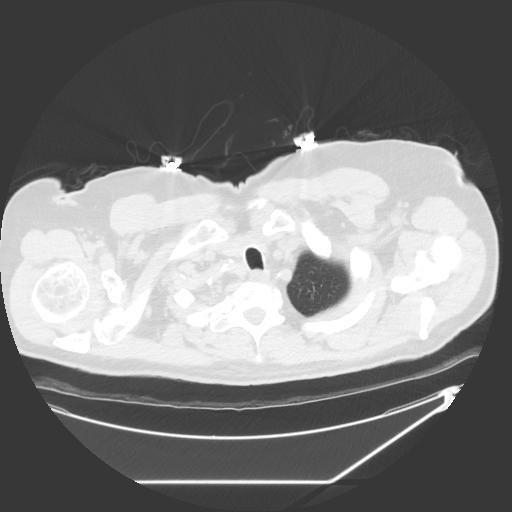

[Series 601: coronal · coronal · 0.90mm/px · 3 of 200 slices shown]
[im 40/200  lung]
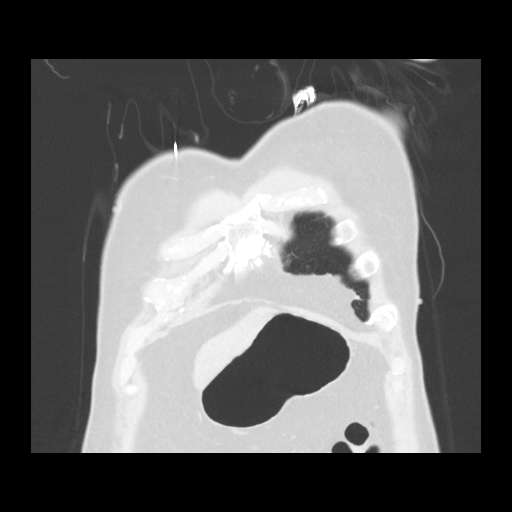
[im 80/200  lung]
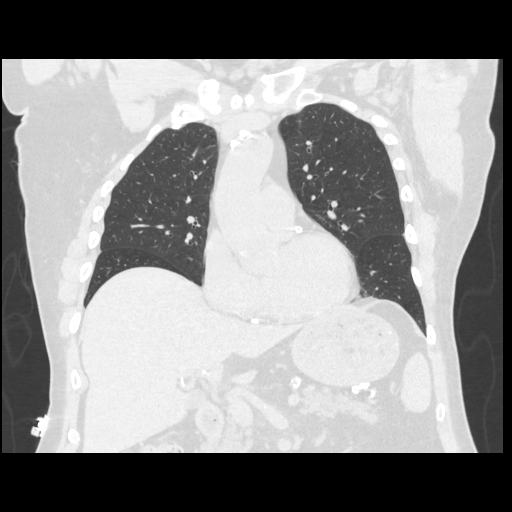
[im 120/200  lung]
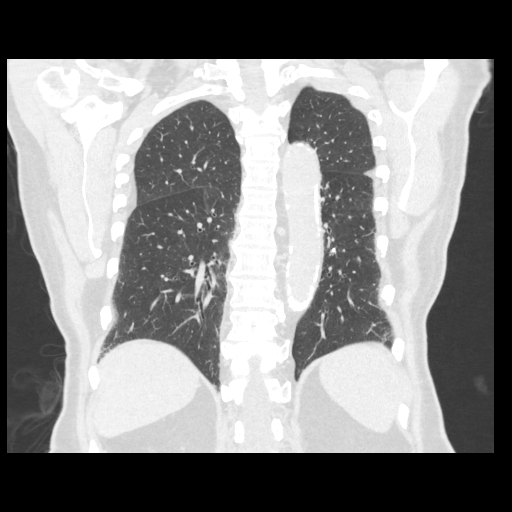

[15 of 36 positions shown; findings below may reference images not displayed]

FINDINGS: Lung window evaluation demonstrates bilateral dependent atelectasis. The airway is grossly intact. Bone window evaluation demonstrates no acute process. There is some mild scattered degenerative disc disease. Soft tissue evaluation demonstrates unremarkable chest wall. There is moderate arthrosclerotic calcification. There is a thin aortic ectasia 3.8 cm. There is heavy coronary calcification. Upper abdominal contents are unremarkable. Gallstone identified.
IMPRESSION: Ascending aortic ectasia 3.8 cm with prominent arthrosclerotic disease.

## 2023-05-25 IMAGING — US US LOW EXT VEINS RIGHT MAPPING
1 series · 12 of 12 positions shown · non-contrast
Comparison: none

FINAL REPORT:
INDICATION: Atherosclerotic heart disease of native coronary artery without angina pectoris Encounter for other preprocedural examination
EXAM: Bilateral lower extremity vein mapping.

[Series 1: us low ext veins right mapping · 12 of 12 slices shown]
[im 1/12]
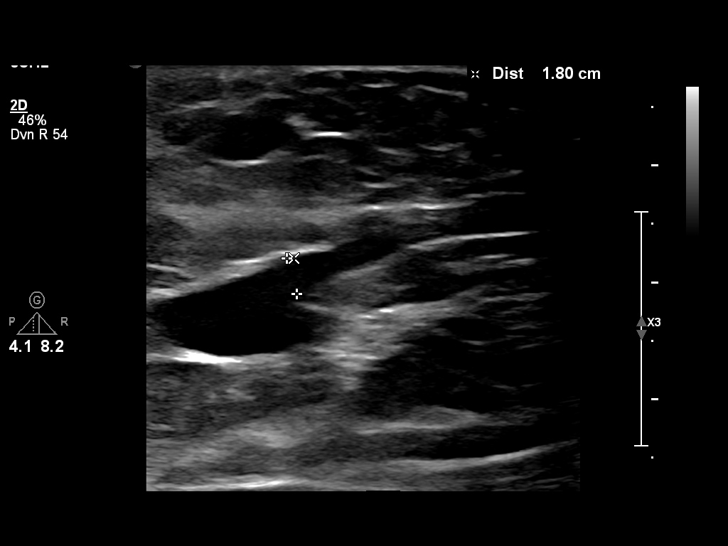
[im 2/12]
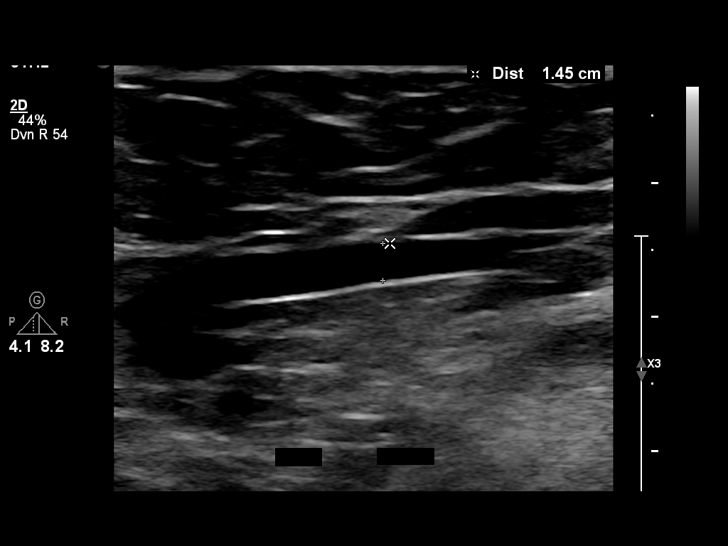
[im 3/12]
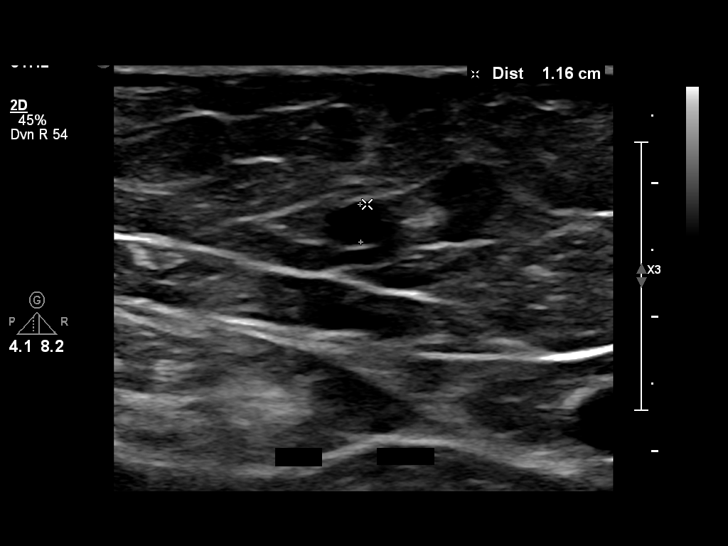
[im 4/12]
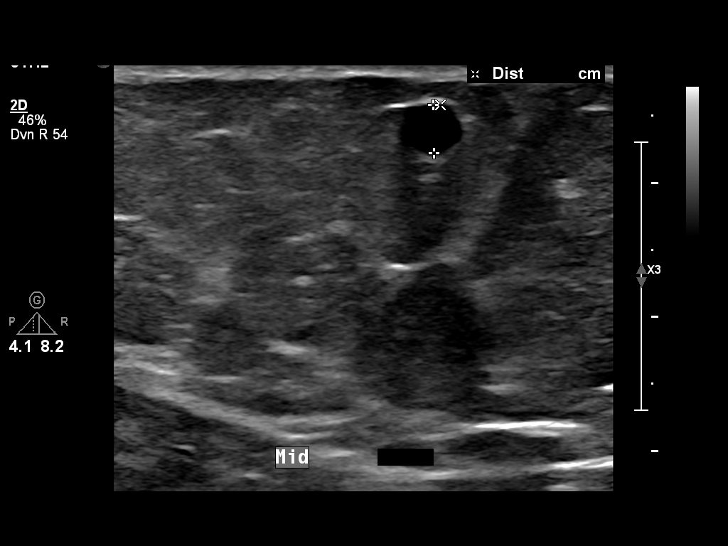
[im 5/12]
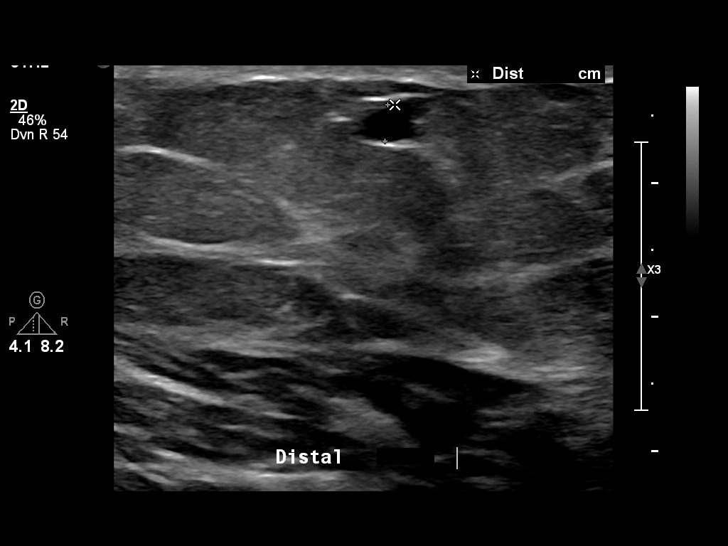
[im 6/12]
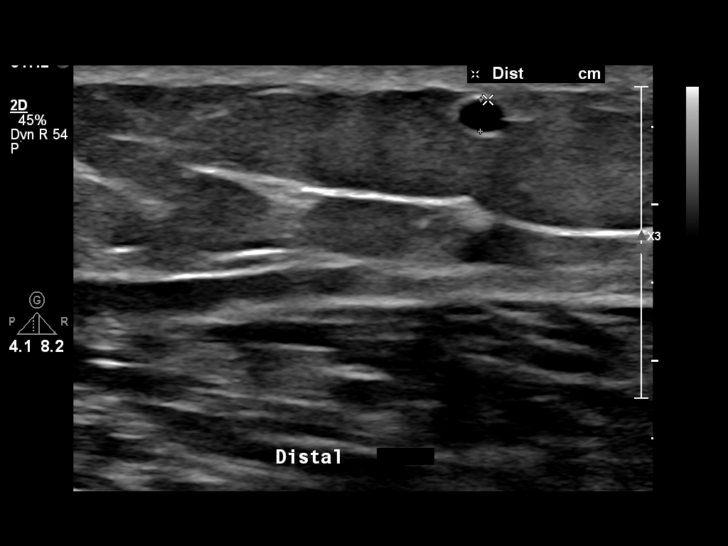
[im 7/12]
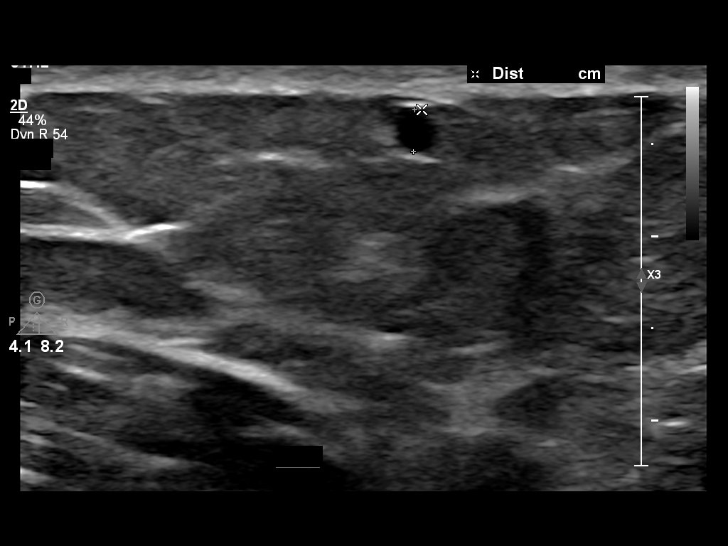
[im 8/12]
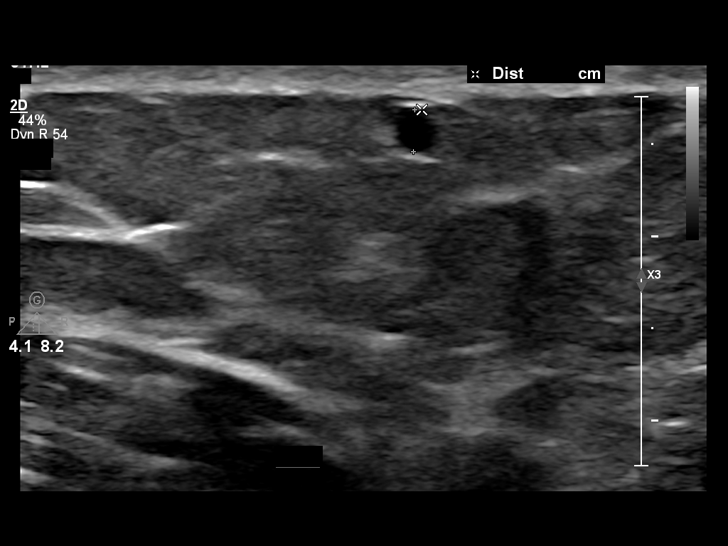
[im 9/12]
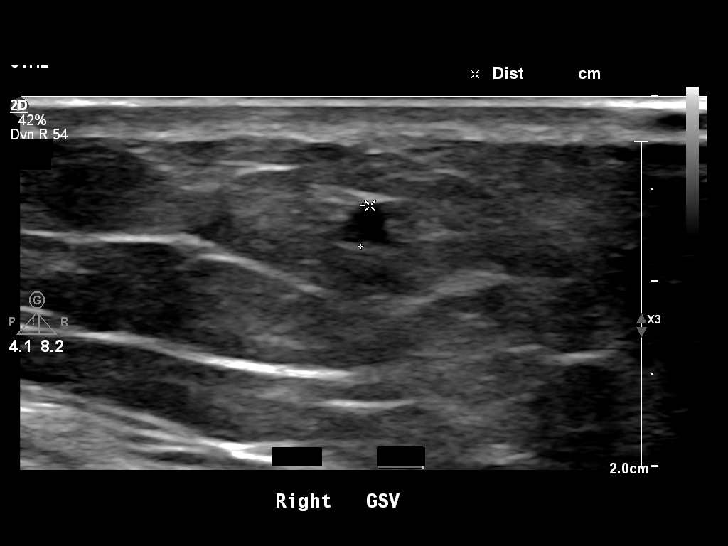
[im 10/12]
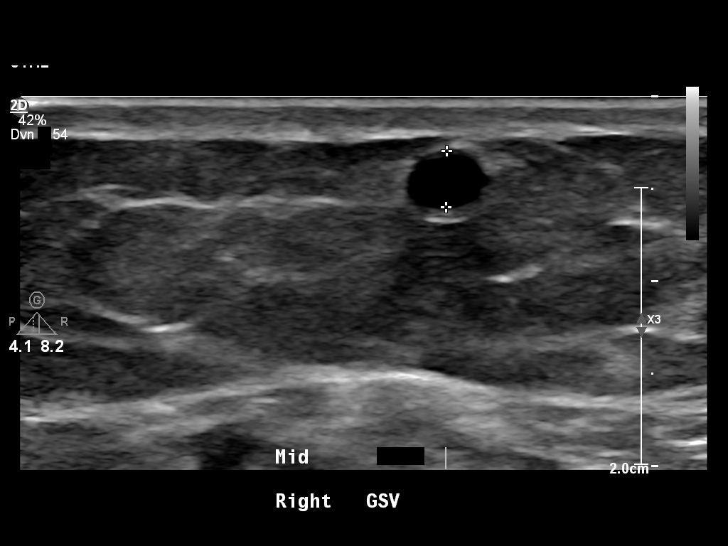
[im 11/12]
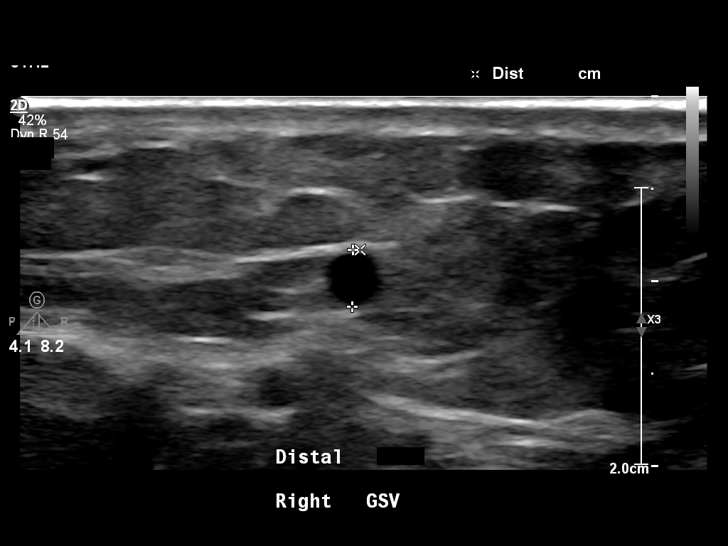
[im 12/12]
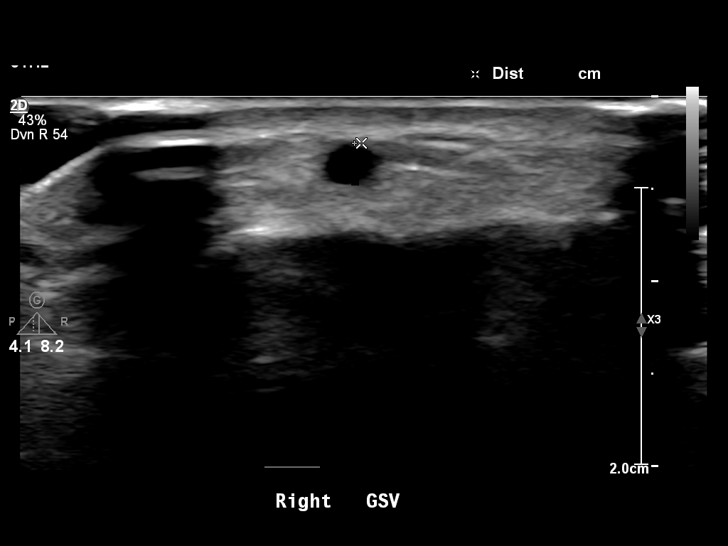

[12 of 12 positions shown; findings below may reference images not displayed]

FINDINGS: Right:
GSV Saphenofemoral junction: 0.32 cm and 1.8 cm deep
GSV Upper thigh: 0.28 cm  and 1.5 cm deep
GSV mid thigh: 0.36 cm and 0.42 cm deep
GSV Lower thigh: 0.27 cm and 0.42 cm deep
GSV Knee: 0.23 cm and 0.32 cm deep
GSV Upper calf: 2.2 cm and 0.59 cm deep
GSV mid calf: 0.3 cm and 0.33 cm deep
GSV Lower calf: 0.31 cm and 0.83 cm deep
Left:
GSV Saphenofemoral junction: 0.79 cm and 2.8 cm deep
GSV Upper thigh: 0.25 cm and 1.3 cm deep
GSV mid thigh: 0.42 cm and 0.52 cm deep
GSV Lower thigh: 0.28 cm and 0.31 cm deep
GSV Knee: 0.35 cm and 0.32 cm deep
GSV Upper calf: 0.44 cm and 0.37 cm deep
GSV mid calf: 0.30 cm and 0.61 cm deep
GSV Lower calf: 0.2 cm and 0.89 cm deep
IMPRESSION: Greater saphenous vein diameters and depths as above

## 2023-05-25 IMAGING — US US CAROTID DOPPLER BILATERAL
1 series · 13 of 24 positions shown · non-contrast
Comparison: none

Addendum:
(#[id number]
\F\[HOSPITAL]\F\
Communicated to: Juanjose Christensen, NP
On behalf of: Dr. Abou Jean Alahmd
By: Alia Tiger
At: [DATE]
On: 05/25/2023 EST
\F\/[HOSPITAL]\F\#)
FINAL REPORT:
Carotid ultrasound
HISTORY: Preop CABG.
TECHNIQUE: Real-time grayscale, color Doppler, and spectral Doppler images were acquired through the carotid and vertebral arteries.

[Series 1: us carotid doppler bilateral · 13 of 75 slices shown]
[im 1/75]
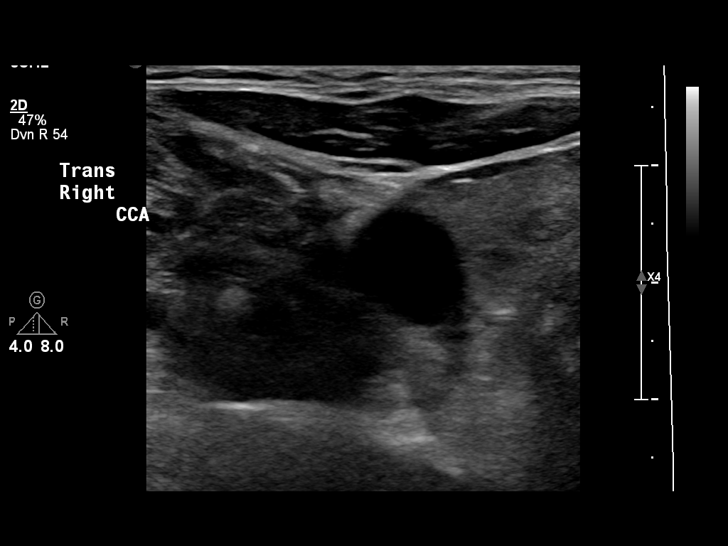
[im 7/75]
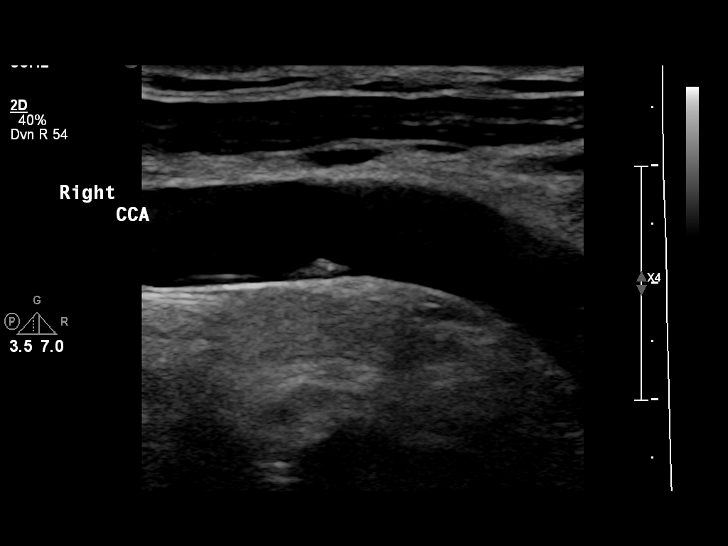
[im 13/75]
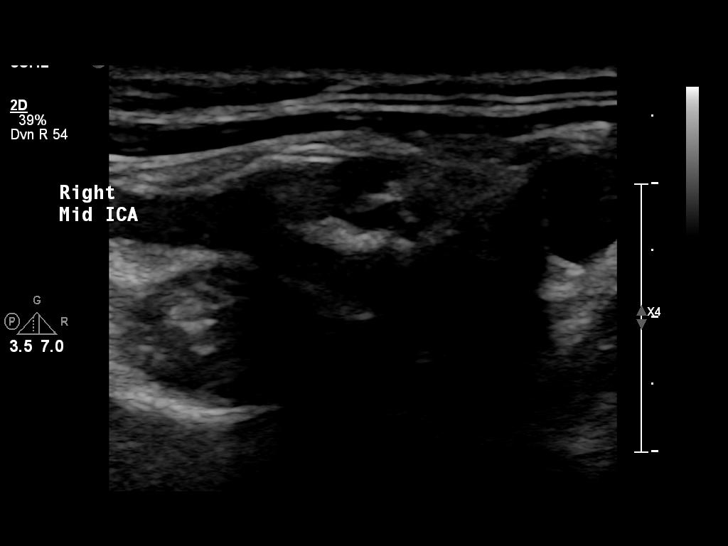
[im 20/75]
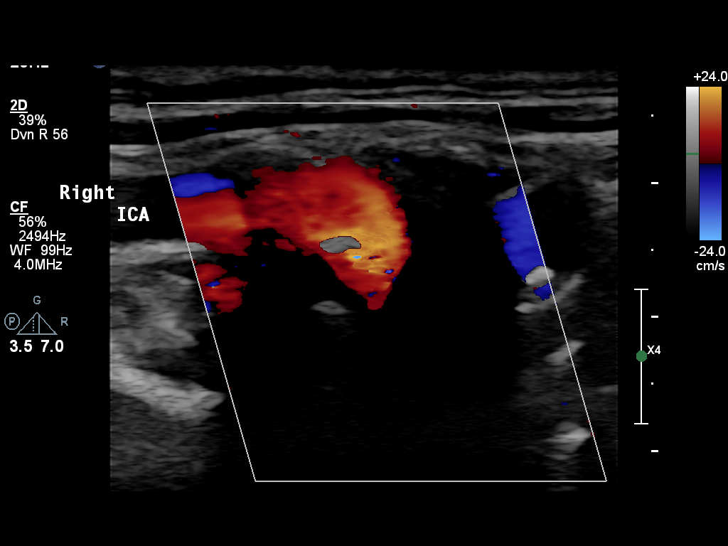
[im 26/75]
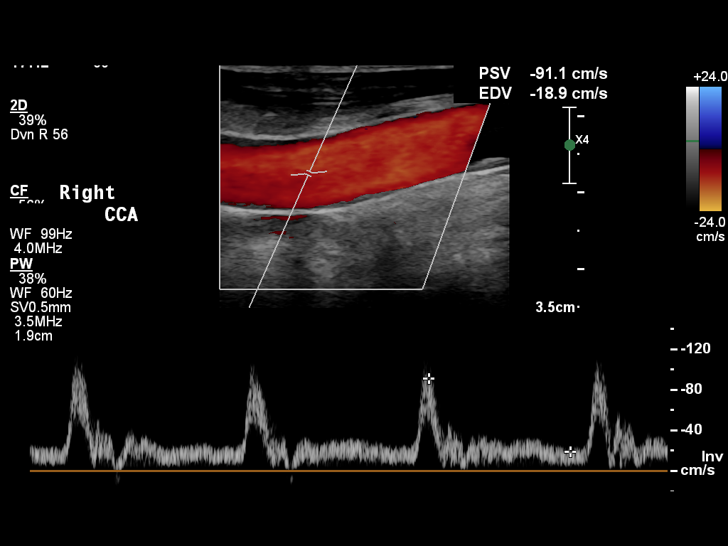
[im 33/75]
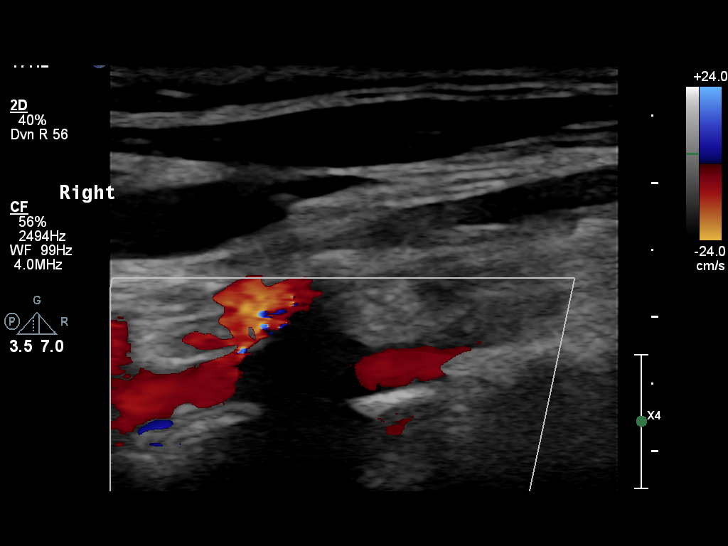
[im 39/75]
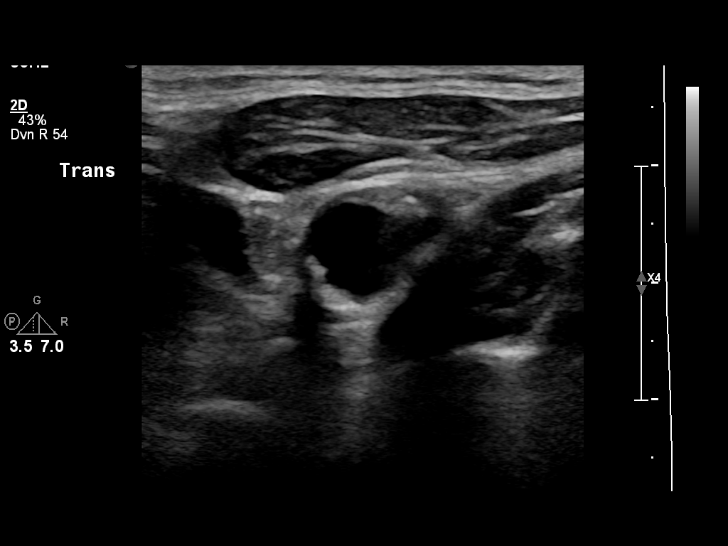
[im 42/75]
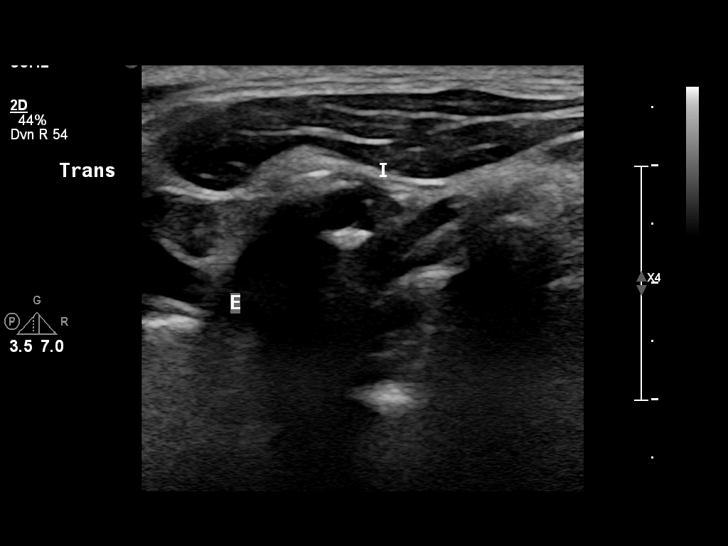
[im 49/75]
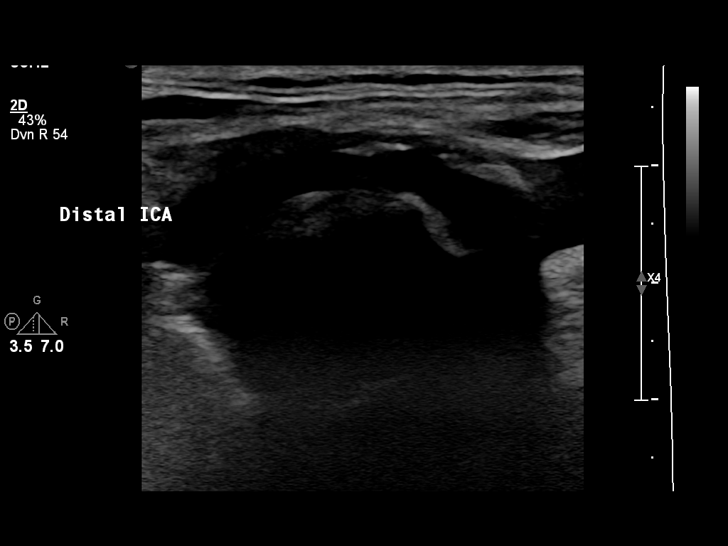
[im 55/75]
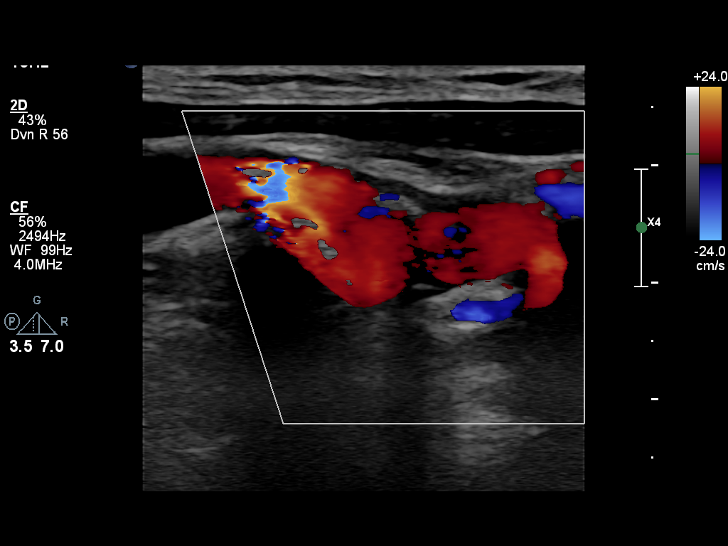
[im 62/75]
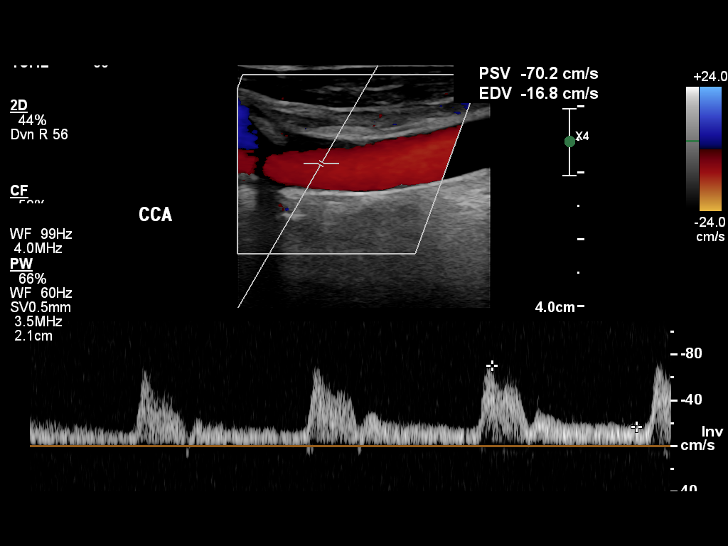
[im 68/75]
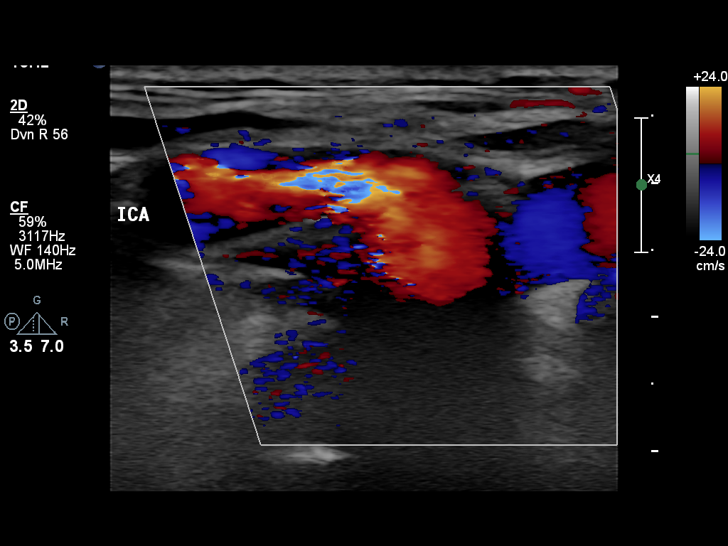
[im 75/75]
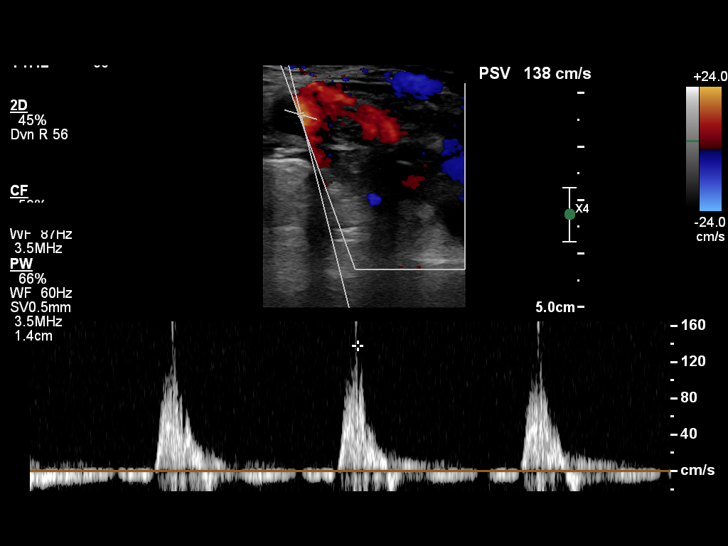

[13 of 24 positions shown; findings below may reference images not displayed]

FINDINGS: Right: There is moderate to severe plaque identified within the right internal carotid bulb. The peak systolic velocity within the right internal carotid artery is 120 cm/sec with an IC/CC ratio of 1.3. There is antegrade flow within the right external carotid and right vertebral arteries.
Left: There is moderate to severe plaque identified within the left internal carotid bulb. The peak systolic velocity within the left internal carotid artery is 291 cm/sec with an IC/CC ratio 4.2. There is antegrade flow within the left external carotid and left vertebral arteries. There is elevated velocity within the left external carotid artery.
IMPRESSION: :
1. Greater than 80% stenosis involving the left carotid system based on velocity criteria and moderate to severe arthrosclerotic disease. Recommend CT arteriogram of the neck to better completely evaluate.
2. Less than 50% stenosis based on velocity criteria however given the moderate to severe arthrosclerotic disease recommend attention on CT arteriogram recommended above.
Stenoses based upon velocity measurement correlations as defined by the Society of radiologists in Ultrasound Consensus Conference Radiology 2554; 229; 340-346

## 2023-08-05 IMAGING — CR XR CHEST 1 VIEW
1 series · 1 of 1 positions shown · non-contrast
Comparison: 05/22/2023

FINAL REPORT:
Chest portable one view
INDICATION: dizzy

[AP]
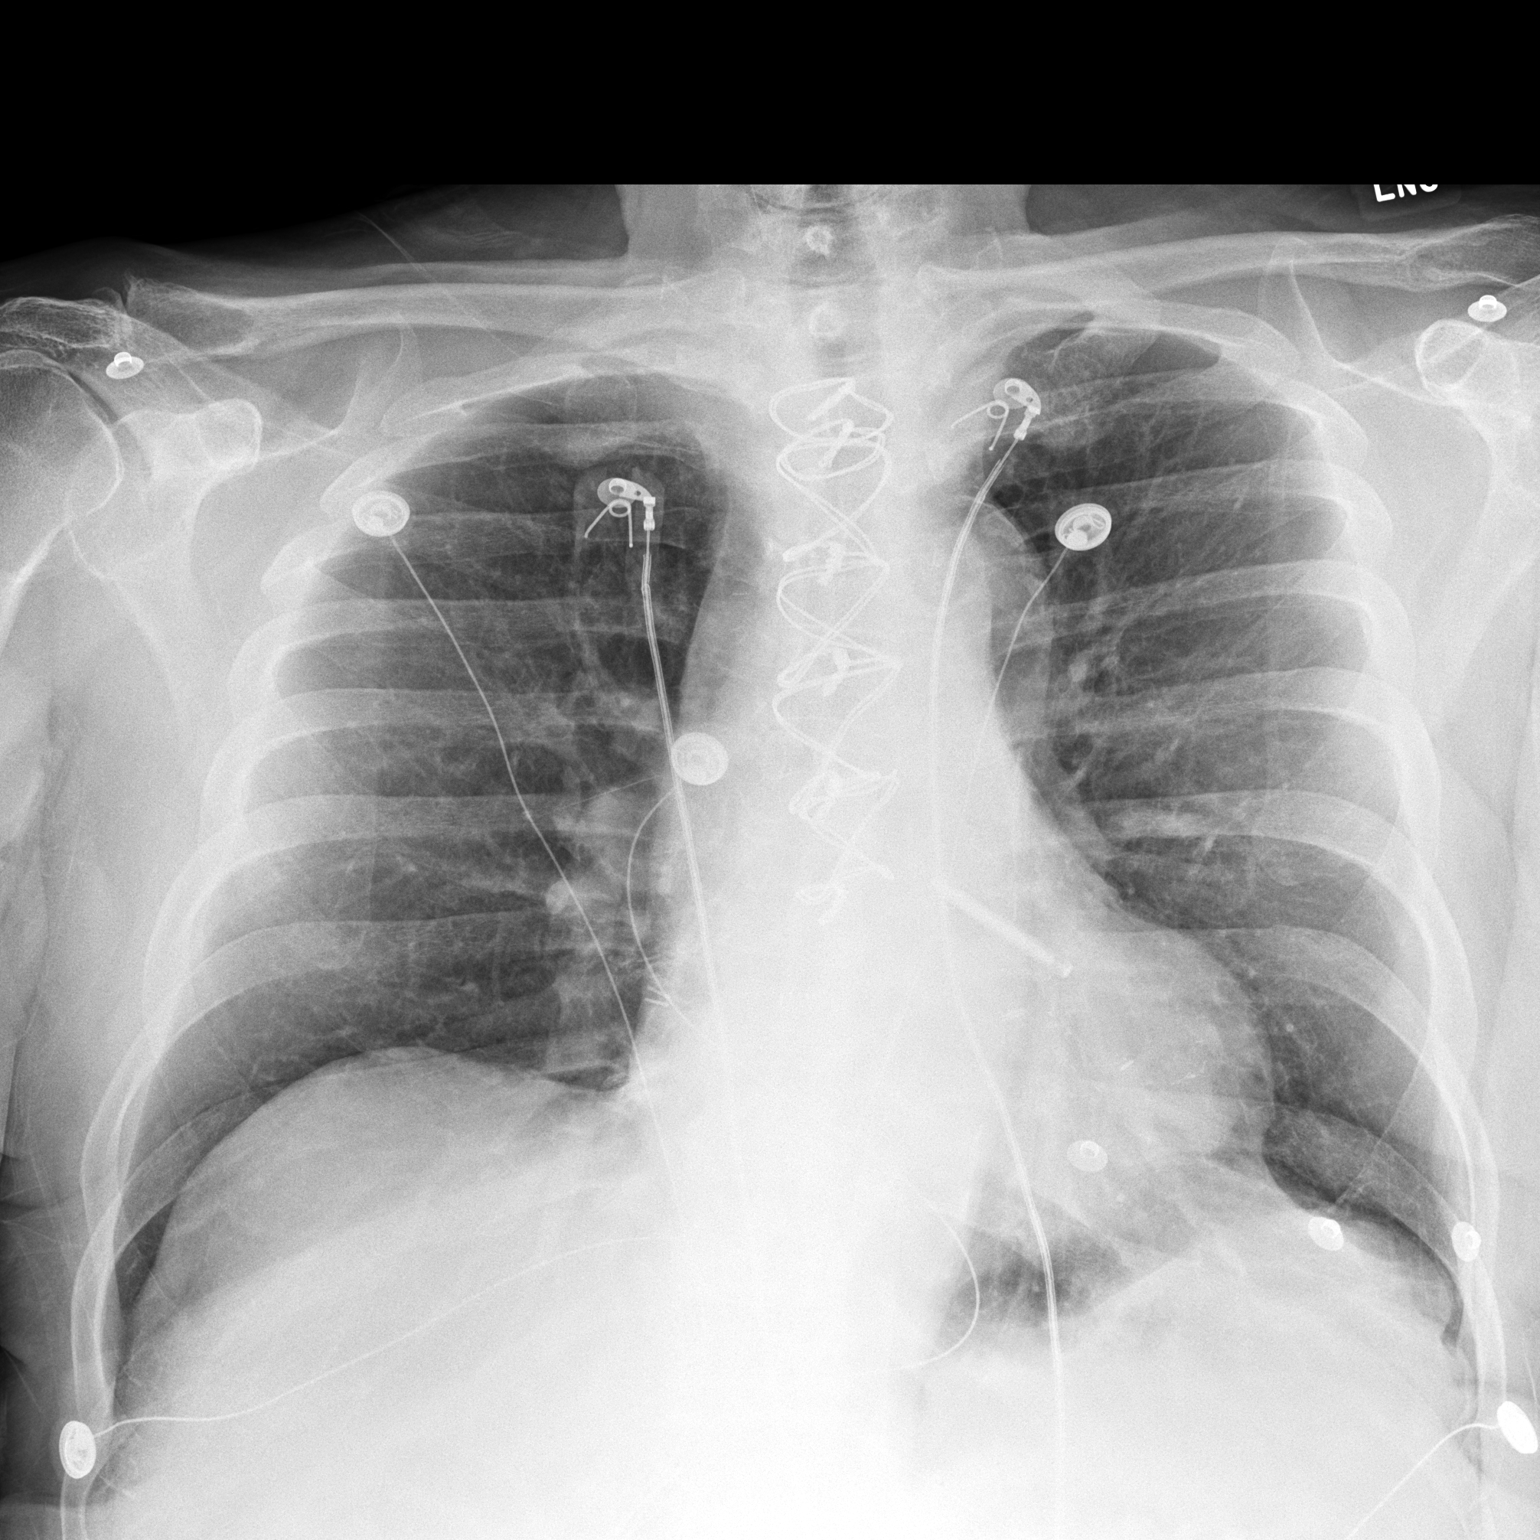

[1 of 1 positions shown; findings below may reference images not displayed]

FINDINGS: Sternotomy wires are noted. Left atrial appendage clip noted. Degenerative changes within the right acromioclavicular joint. Elevated right hemidiaphragm. The cardiac silhouette is within normal limits   given portable technique.  The lungs are clear.   No pneumothorax. No pleural effusion.   No acute osseous abnormalities.
IMPRESSION: 
IMPRESSION: No acute cardiopulmonary process.
Portable?
Yes

## 2024-02-09 IMAGING — CT CT HEAD ANGIO WITH AND WITHOUT IV CONTRAST
1 of 12 series · 3 of 14 positions shown · IV contrast (ISOVUE 370)
Comparison: 12/31/2021

F/U MCA aneurysm, carotid stenosis, prior CVA (Rt posterior cerebral artery thrombosis)
FINAL REPORT:
CT NECK ANGIO WITH AND WITHOUT IV CONTRAST, CT HEAD ANGIO WITH AND WITHOUT IV CONTRAST
INDICATION: Carotid and vertebral artery stenosis, follow-up left MCA aneurysm.
TECHNIQUE: 1. CT of the brain was performed without contrast.
2. CTA of the head and neck was performed with intravenous contrast. Coronal and sagittal reformats and MIP/3D reconstructions were submitted for evaluation.

[Series 4: head/neck thins · axial · 0.58mm/px · z∈[-86,+303]mm · 3 of 622 slices shown]
[im 1/622  soft-tissue]
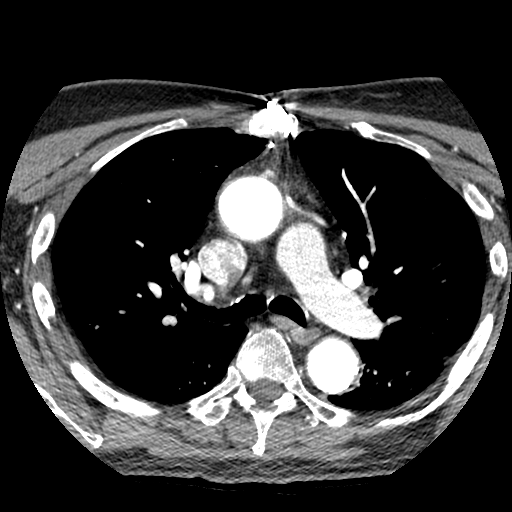
[im 311/622  bone]
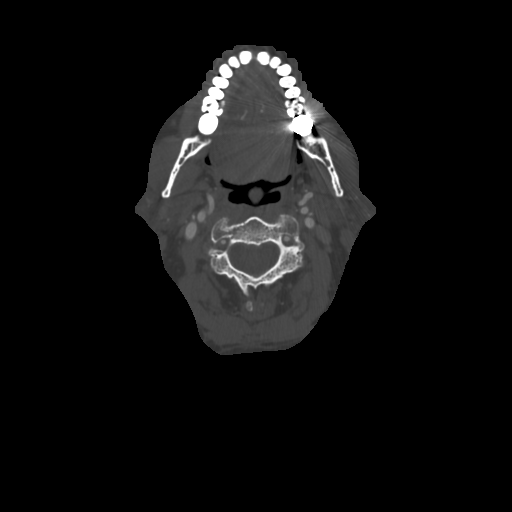
[im 622/622  soft-tissue]
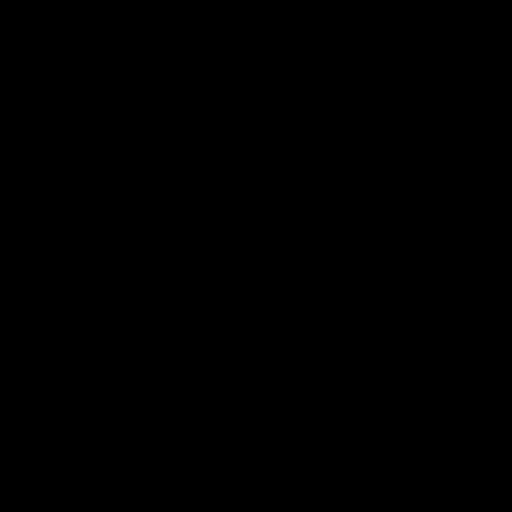

[3 of 14 positions shown; findings below may reference images not displayed]

FINDINGS: CTA: There is mild stenosis at the origin of the left vertebral artery. Severe stenosis in the V4 segment of the left vertebral artery. Moderate stenosis in the V4 segment of the right vertebral artery. Bilateral posterior inferior and anterior inferior cerebellar arteries are seen. Moderate stenosis in the proximal basilar artery. The superior cerebellar arteries are unremarkable. Severe stenosis in the P2 segment of the left posterior cerebral artery. The right posterior cerebral artery is occluded, unchanged. Bilateral posterior communicating arteries.
There are atherosclerotic calcifications in the left carotid bulb and proximal left internal carotid artery causing severe stenosis with near occlusion in the proximal left internal carotid artery. Atherosclerotic calcifications in the right carotid bulb and proximal right internal carotid artery causing approximately 40% stenosis in the proximal right internal carotid artery. Atherosclerotic calcifications in the cavernous carotid arteries without significant stenosis. Focal ectasia in the proximal cavernous portion of the right internal carotid artery. Moderate stenosis in the supraclinoid portion of the left internal carotid artery.
There is severe stenosis in the distal M1 segment of the left middle cerebral artery.. There is aneurysmal dilation of the left middle cerebral artery just distal to the stenosis measuring 7 mm (image 455 series 4). Mild stenosis in the M1 segment of the right middle cerebral artery. No significant anterior cerebral artery stenosis. The left A2 segment arises from the right A1 segment.
Additional Observations: There are age-related involutional changes. Old right PCA territory infarct. No acute intracranial hemorrhage, mass effect, or hydrocephalus. No territorial hypodensities to suggest acute infarct. Atherosclerotic calcifications. No extra-axial collections.
The orbits and globes are within normal limits. The mastoid air cells are well aerated. Small retention cyst in the maxillary sinuses. No acute findings in the neck or imaged portions of the lungs. Degenerative changes in the cervical spine. No suspicious lytic or blastic lesions.
IMPRESSION: 
IMPRESSION: 1. No significant change since 12/31/2021.
[DATE]. Extensive atherosclerosis with severe stenosis and near occlusion in the proximal left internal carotid artery. 40% stenosis in the proximal right internal carotid artery.
3. Severe stenosis in the M1 segment of the left middle cerebral artery with 7 mm aneurysm just distal to the stenosis, similar compared to the prior study.
4. Chronic occlusion of the right posterior cerebral artery.
Stenoses measurements are based upon NASCET criteria. (#[id number]#).
All CT scans at this facility use iterative reconstruction technique, dose modulation, and/or weight based dosing when appropriate to reduce radiation dose to as low as reasonably achievable.

## 2024-03-21 IMAGING — MR MRI LUMBAR SPINE WITHOUT CONTRAST
8 of 11 series · 26 of 48 positions shown · non-contrast
Comparison: None available.

FINAL REPORT:
EXAM: MRI LUMBAR SPINE WITHOUT CONTRAST
INDICATION: Radiculopathy, lumbar region
TECHNIQUE: Multiplanar multisequence MRI of the lumbar spine without IV contrast.

[Series 5: survey · sagittal · 8.0mm · 0.88mm/px · 1 of 8 slices shown]
[im 1/8]
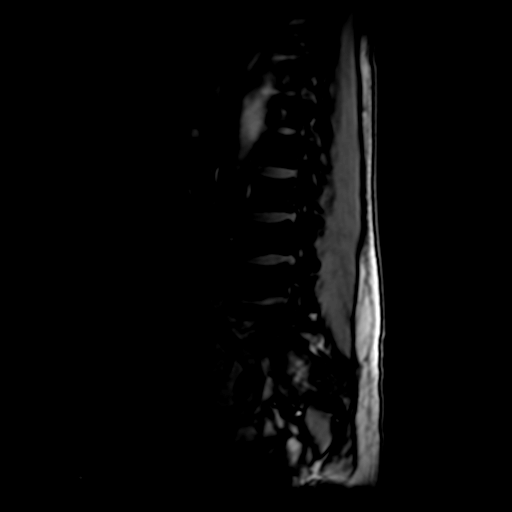

[Series 6: T2 · sagittal · 4.0mm · 0.62mm/px · 3 of 21 slices shown (1 of 3)]
[im 1/21]
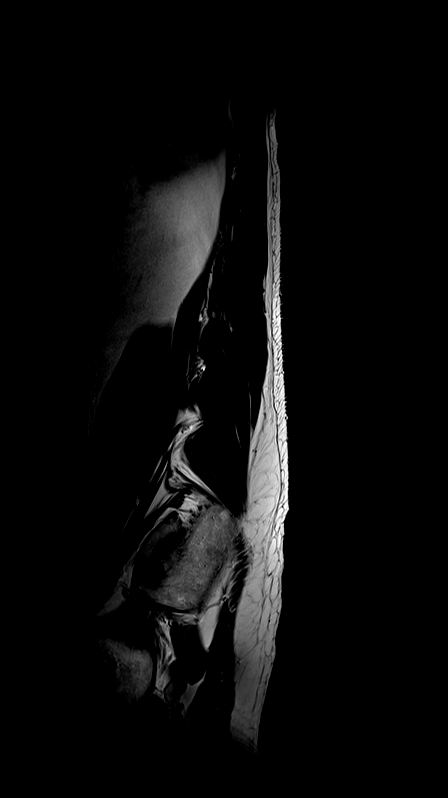
[im 11/21]
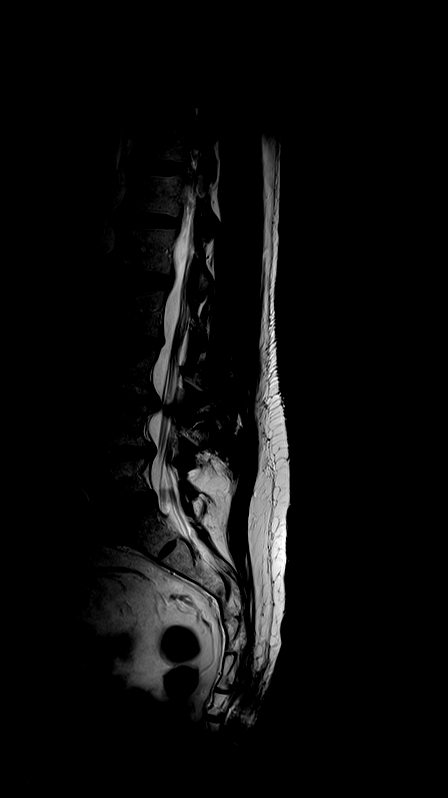
[im 21/21]
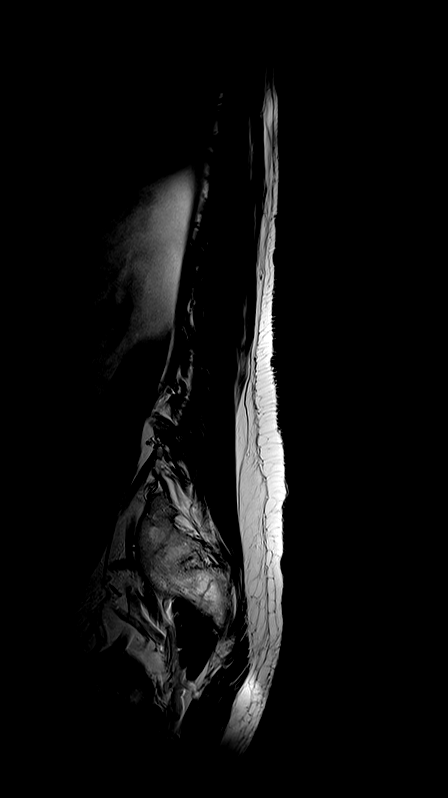

[Series 7: STIR · sagittal · 4.0mm · 0.88mm/px · 3 of 21 slices shown]
[im 1/21]
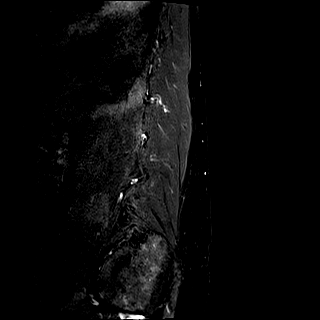
[im 11/21]
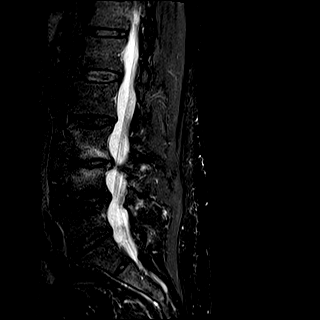
[im 21/21]
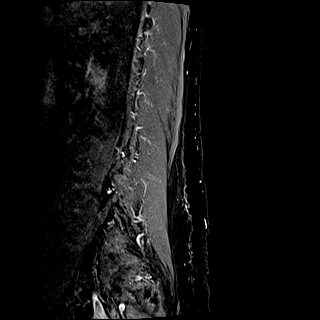

[Series 8: T1 · sagittal · 4.0mm · 0.88mm/px · 3 of 21 slices shown (1 of 3)]
[im 1/21]
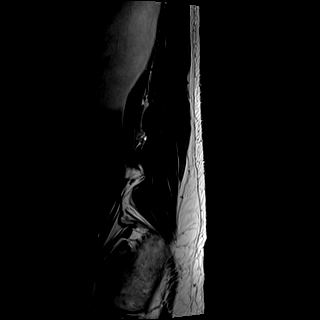
[im 11/21]
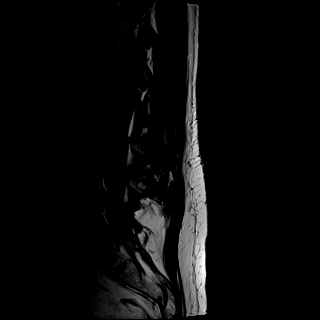
[im 21/21]
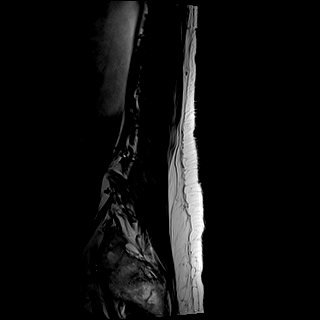

[Series 9: T2 · axial · 4.0mm · 0.69mm/px · z∈[-95,-9]mm · 3 of 21 slices shown (2 of 3)]
[im 1/21]
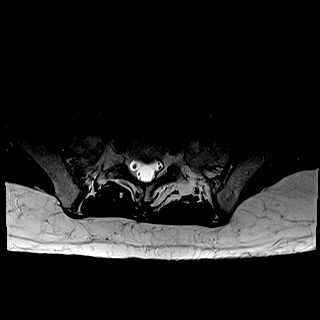
[im 11/21]
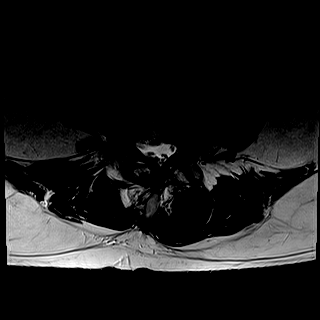
[im 21/21]
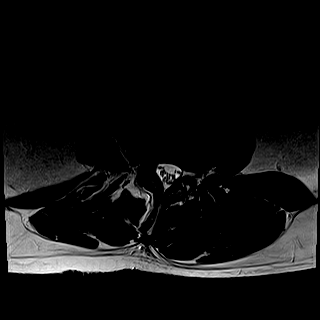

[Series 10: T2 · axial · 4.0mm · 0.69mm/px · z∈[+1,+141]mm · 5 of 33 slices shown (3 of 3)]
[im 1/33]
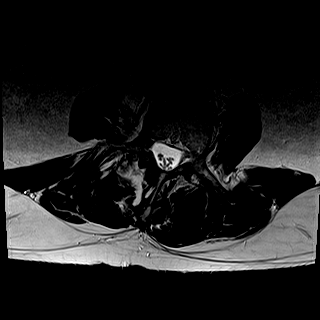
[im 9/33]
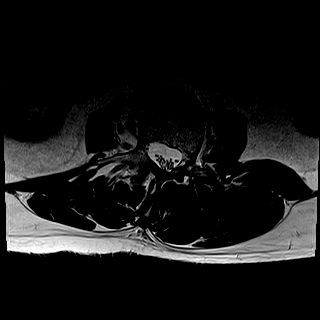
[im 17/33]
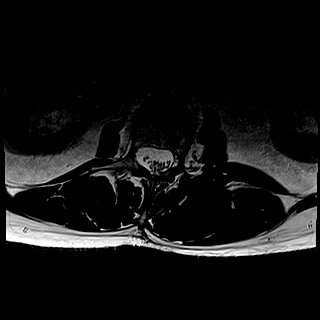
[im 25/33]
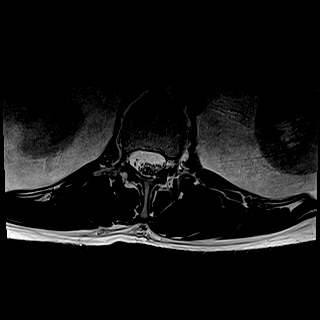
[im 33/33]
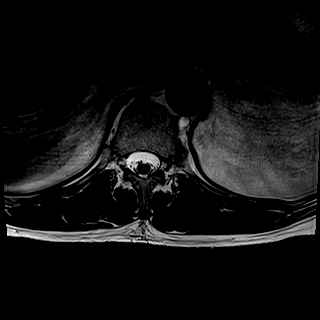

[Series 12: T1 · axial · 4.0mm · 0.69mm/px · z∈[-95,-9]mm · 3 of 21 slices shown (2 of 3)]
[im 1/21]
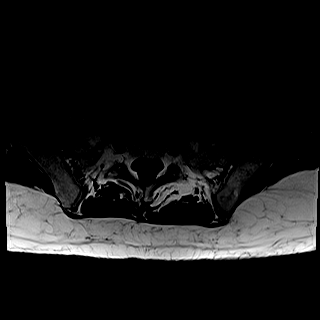
[im 11/21]
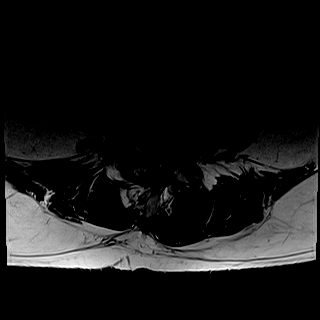
[im 21/21]
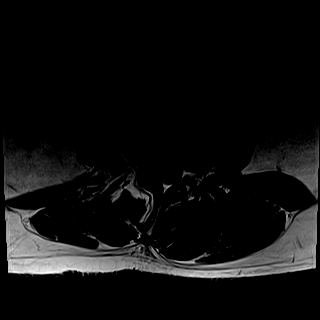

[Series 13: T1 · axial · 4.0mm · 0.69mm/px · z∈[+1,+141]mm · 5 of 33 slices shown (3 of 3)]
[im 1/33]
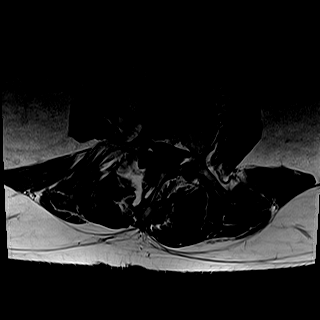
[im 9/33]
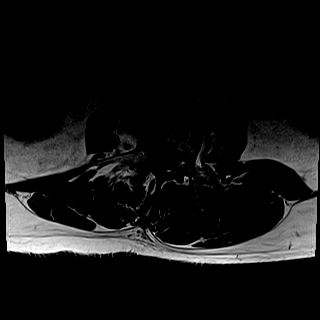
[im 17/33]
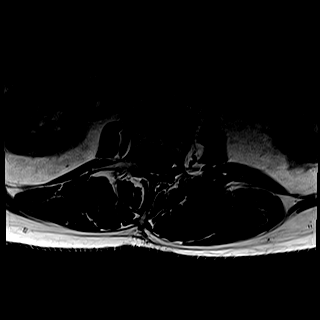
[im 25/33]
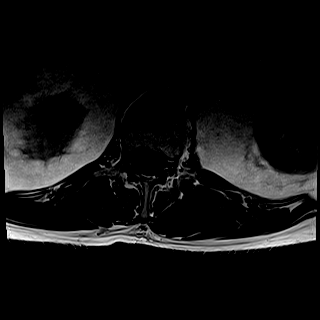
[im 33/33]
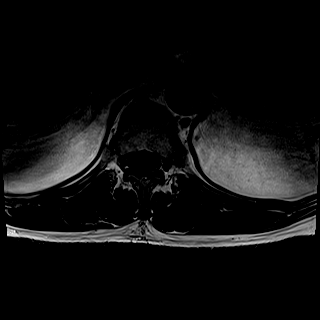

[26 of 48 positions shown; findings below may reference images not displayed]

FINDINGS: LUMBAR SEGMENTATION: This dictation assumes the most caudal fully segmented vertebra is labeled L5. 5 lumbar type vertebral bodies.
VERTEBRAL BODIES/ALIGNMENT: Straightening of the lumbar spine in the AP dimension. Levocurvature of the lumbar spine centered at L3-4. No acute fracture or traumatic malalignment.
MARROW: Multilevel reactive endplate changes.
INTERVERTEBRAL DISCS: Multilevel disc height loss and desiccation.
PARAVERTEBRAL SOFT TISSUES: Unremarkable.
ADDITIONAL SOFT TISSUES: Diverticulosis.
INTRATHECAL CONTENTS: No abnormal cord signal. The conus terminates at a normal level.
INDIVIDUAL LEVELS:
T12-L1: No significant spinal canal or neural foraminal stenosis.
L1-L2: Mild disc bulge and facet hypertrophy without significant spinal canal or neural foraminal stenosis.
L2-L3: Mild disc bulge, facet hypertrophy, ligamentum flavum hypertrophy causing mild right neural foraminal narrowing.
L3-L4: Disc bulge, facet hypertrophy, ligamentum flavum hypertrophy causing severe right and moderate left neural foraminal stenosis.
L4-L5: Disc bulge, facet hypertrophy, ligamentum flavum hypertrophy and epidural lipomatosis causing mild spinal canal and severe bilateral neuroforaminal stenosis.
L5-S1: Disc bulge and facet hypertrophy causing severe left and mild to moderate right neural foraminal stenosis.
IMPRESSION: 1.  Levocurvature and multilevel lumbar spondylosis, including mild spinal canal and severe bilateral neural foraminal stenosis at L4-5.
2.  Additional severe neural foraminal stenosis is noted at L3-4 and L5-S1.
